# Patient Record
Sex: Female | Born: 1955 | Race: White | Hispanic: No | Marital: Married | Smoking: Former smoker
Health system: Southern US, Community
[De-identification: ages and names within clinical notes are randomized; demographics above are authoritative.]

## PROBLEM LIST (undated history)

## (undated) DIAGNOSIS — R51 Headache: Secondary | ICD-10-CM

## (undated) DIAGNOSIS — M797 Fibromyalgia: Secondary | ICD-10-CM

## (undated) DIAGNOSIS — R0600 Dyspnea, unspecified: Secondary | ICD-10-CM

## (undated) DIAGNOSIS — E039 Hypothyroidism, unspecified: Secondary | ICD-10-CM

## (undated) DIAGNOSIS — D472 Monoclonal gammopathy: Secondary | ICD-10-CM

## (undated) DIAGNOSIS — G4730 Sleep apnea, unspecified: Secondary | ICD-10-CM

## (undated) DIAGNOSIS — K3184 Gastroparesis: Secondary | ICD-10-CM

## (undated) DIAGNOSIS — M51369 Other intervertebral disc degeneration, lumbar region without mention of lumbar back pain or lower extremity pain: Secondary | ICD-10-CM

## (undated) DIAGNOSIS — M719 Bursopathy, unspecified: Secondary | ICD-10-CM

## (undated) DIAGNOSIS — E240 Pituitary-dependent Cushing's disease: Secondary | ICD-10-CM

## (undated) DIAGNOSIS — I10 Essential (primary) hypertension: Secondary | ICD-10-CM

## (undated) DIAGNOSIS — M1990 Unspecified osteoarthritis, unspecified site: Secondary | ICD-10-CM

## (undated) DIAGNOSIS — G629 Polyneuropathy, unspecified: Secondary | ICD-10-CM

## (undated) DIAGNOSIS — M5136 Other intervertebral disc degeneration, lumbar region: Secondary | ICD-10-CM

## (undated) DIAGNOSIS — F419 Anxiety disorder, unspecified: Secondary | ICD-10-CM

## (undated) DIAGNOSIS — M351 Other overlap syndromes: Secondary | ICD-10-CM

## (undated) DIAGNOSIS — J189 Pneumonia, unspecified organism: Secondary | ICD-10-CM

## (undated) DIAGNOSIS — M3500 Sicca syndrome, unspecified: Secondary | ICD-10-CM

## (undated) DIAGNOSIS — M519 Unspecified thoracic, thoracolumbar and lumbosacral intervertebral disc disorder: Secondary | ICD-10-CM

## (undated) DIAGNOSIS — K219 Gastro-esophageal reflux disease without esophagitis: Secondary | ICD-10-CM

## (undated) HISTORY — PX: TRANSPHENOIDAL / TRANSNASAL HYPOPHYSECTOMY / RESECTION PITUITARY TUMOR: SUR1382

## (undated) HISTORY — DX: Other intervertebral disc degeneration, lumbar region: M51.36

## (undated) HISTORY — DX: Other intervertebral disc degeneration, lumbar region without mention of lumbar back pain or lower extremity pain: M51.369

## (undated) HISTORY — PX: TONSILLECTOMY: SUR1361

## (undated) HISTORY — DX: Gastroparesis: K31.84

## (undated) HISTORY — PX: BRAIN SURGERY: SHX531

## (undated) HISTORY — PX: APPENDECTOMY: SHX54

## (undated) HISTORY — PX: TRIGGER FINGER RELEASE: SHX641

## (undated) HISTORY — PX: TUBAL LIGATION: SHX77

## (undated) HISTORY — PX: COMBINED HYSTEROSCOPY DIAGNOSTIC / D&C: SUR297

## (undated) HISTORY — PX: BACK SURGERY: SHX140

## (undated) HISTORY — PX: ENDOMETRIAL ABLATION: SHX621

## (undated) HISTORY — PX: ESOPHAGOGASTRODUODENOSCOPY ENDOSCOPY: SHX5814

## (undated) HISTORY — DX: Bursopathy, unspecified: M71.9

## (undated) HISTORY — PX: CARPAL TUNNEL RELEASE: SHX101

## (undated) HISTORY — PX: COLONOSCOPY: SHX174

## (undated) HISTORY — PX: CATARACT EXTRACTION, BILATERAL: SHX1313

## (undated) HISTORY — PX: FRACTURE SURGERY: SHX138

---

## 1998-10-23 ENCOUNTER — Inpatient Hospital Stay (HOSPITAL_COMMUNITY): Admission: EM | Admit: 1998-10-23 | Discharge: 1998-10-24 | Payer: Self-pay | Admitting: Emergency Medicine

## 1998-10-23 ENCOUNTER — Encounter: Payer: Self-pay | Admitting: Orthopedic Surgery

## 1998-10-23 ENCOUNTER — Encounter: Payer: Self-pay | Admitting: Emergency Medicine

## 1998-10-24 ENCOUNTER — Encounter: Payer: Self-pay | Admitting: Orthopedic Surgery

## 1999-01-24 ENCOUNTER — Ambulatory Visit (HOSPITAL_COMMUNITY): Admission: RE | Admit: 1999-01-24 | Discharge: 1999-01-24 | Payer: Self-pay | Admitting: Orthopedic Surgery

## 1999-01-24 ENCOUNTER — Encounter: Payer: Self-pay | Admitting: Orthopedic Surgery

## 1999-04-27 ENCOUNTER — Other Ambulatory Visit: Admission: RE | Admit: 1999-04-27 | Discharge: 1999-04-27 | Payer: Self-pay | Admitting: Family Medicine

## 2004-07-25 ENCOUNTER — Ambulatory Visit (HOSPITAL_COMMUNITY): Admission: RE | Admit: 2004-07-25 | Discharge: 2004-07-25 | Payer: Self-pay | Admitting: Obstetrics

## 2007-08-09 ENCOUNTER — Encounter: Admission: RE | Admit: 2007-08-09 | Discharge: 2007-08-09 | Payer: Self-pay | Admitting: Orthopedic Surgery

## 2008-02-27 ENCOUNTER — Encounter: Admission: RE | Admit: 2008-02-27 | Discharge: 2008-02-27 | Payer: Self-pay | Admitting: Internal Medicine

## 2009-03-22 ENCOUNTER — Ambulatory Visit (HOSPITAL_COMMUNITY): Admission: RE | Admit: 2009-03-22 | Discharge: 2009-03-22 | Payer: Self-pay | Admitting: Obstetrics

## 2009-04-20 ENCOUNTER — Ambulatory Visit (HOSPITAL_BASED_OUTPATIENT_CLINIC_OR_DEPARTMENT_OTHER): Admission: RE | Admit: 2009-04-20 | Discharge: 2009-04-20 | Payer: Self-pay | Admitting: Orthopedic Surgery

## 2009-12-07 ENCOUNTER — Encounter
Admission: RE | Admit: 2009-12-07 | Discharge: 2009-12-07 | Payer: Self-pay | Admitting: Physical Medicine and Rehabilitation

## 2010-03-07 ENCOUNTER — Encounter: Admission: RE | Admit: 2010-03-07 | Discharge: 2010-03-07 | Payer: Self-pay | Admitting: Neurosurgery

## 2010-03-14 ENCOUNTER — Encounter: Admission: RE | Admit: 2010-03-14 | Discharge: 2010-03-14 | Payer: Self-pay | Admitting: Neurosurgery

## 2010-03-30 ENCOUNTER — Inpatient Hospital Stay (HOSPITAL_COMMUNITY): Admission: RE | Admit: 2010-03-30 | Discharge: 2010-04-01 | Payer: Self-pay | Admitting: Neurosurgery

## 2010-05-10 ENCOUNTER — Encounter: Admission: RE | Admit: 2010-05-10 | Discharge: 2010-05-10 | Payer: Self-pay | Admitting: Neurosurgery

## 2010-06-21 ENCOUNTER — Encounter: Admission: RE | Admit: 2010-06-21 | Discharge: 2010-06-21 | Payer: Self-pay | Admitting: Neurosurgery

## 2010-07-19 ENCOUNTER — Ambulatory Visit (HOSPITAL_COMMUNITY)
Admission: RE | Admit: 2010-07-19 | Discharge: 2010-07-19 | Payer: Self-pay | Source: Home / Self Care | Attending: Unknown Physician Specialty | Admitting: Unknown Physician Specialty

## 2010-09-02 ENCOUNTER — Encounter: Payer: Self-pay | Admitting: Obstetrics

## 2010-10-27 LAB — GLUCOSE, CAPILLARY
Glucose-Capillary: 107 mg/dL — ABNORMAL HIGH (ref 70–99)
Glucose-Capillary: 168 mg/dL — ABNORMAL HIGH (ref 70–99)
Glucose-Capillary: 239 mg/dL — ABNORMAL HIGH (ref 70–99)
Glucose-Capillary: 55 mg/dL — ABNORMAL LOW (ref 70–99)
Glucose-Capillary: 70 mg/dL (ref 70–99)
Glucose-Capillary: 73 mg/dL (ref 70–99)
Glucose-Capillary: 91 mg/dL (ref 70–99)
Glucose-Capillary: 95 mg/dL (ref 70–99)

## 2010-10-27 LAB — CBC
HCT: 43.2 % (ref 36.0–46.0)
Hemoglobin: 14.1 g/dL (ref 12.0–15.0)
MCH: 28.9 pg (ref 26.0–34.0)
MCHC: 32.6 g/dL (ref 30.0–36.0)
MCV: 88.5 fL (ref 78.0–100.0)
Platelets: 330 10*3/uL (ref 150–400)
RBC: 4.88 MIL/uL (ref 3.87–5.11)
RDW: 15.6 % — ABNORMAL HIGH (ref 11.5–15.5)
WBC: 16.6 10*3/uL — ABNORMAL HIGH (ref 4.0–10.5)

## 2010-10-27 LAB — BASIC METABOLIC PANEL
BUN: 16 mg/dL (ref 6–23)
CO2: 27 mEq/L (ref 19–32)
Calcium: 9.8 mg/dL (ref 8.4–10.5)
Chloride: 102 mEq/L (ref 96–112)
Creatinine, Ser: 0.62 mg/dL (ref 0.4–1.2)
GFR calc Af Amer: >60 mL/min (ref >60)
GFR calc non Af Amer: >60 mL/min (ref >60)
Glucose, Bld: 144 mg/dL — ABNORMAL HIGH (ref 70–99)
Potassium: 4.2 mEq/L (ref 3.5–5.1)
Sodium: 138 mEq/L (ref 135–145)

## 2010-10-27 LAB — TYPE AND SCREEN
ABO/RH(D): A POS
Antibody Screen: NEGATIVE

## 2010-10-27 LAB — SURGICAL PCR SCREEN
MRSA, PCR: NEGATIVE
Staphylococcus aureus: POSITIVE — AB

## 2010-10-27 LAB — ABO/RH: ABO/RH(D): A POS

## 2010-11-17 LAB — BASIC METABOLIC PANEL
CO2: 27 mEq/L (ref 19–32)
Calcium: 9.5 mg/dL (ref 8.4–10.5)
Creatinine, Ser: 0.6 mg/dL (ref 0.4–1.2)
Glucose, Bld: 219 mg/dL — ABNORMAL HIGH (ref 70–99)

## 2011-05-23 DIAGNOSIS — D352 Benign neoplasm of pituitary gland: Secondary | ICD-10-CM | POA: Insufficient documentation

## 2011-05-23 DIAGNOSIS — E249 Cushing's syndrome, unspecified: Secondary | ICD-10-CM | POA: Insufficient documentation

## 2011-10-31 ENCOUNTER — Other Ambulatory Visit: Payer: Self-pay | Admitting: Neurosurgery

## 2011-10-31 DIAGNOSIS — M549 Dorsalgia, unspecified: Secondary | ICD-10-CM

## 2011-11-04 ENCOUNTER — Ambulatory Visit
Admission: RE | Admit: 2011-11-04 | Discharge: 2011-11-04 | Disposition: A | Discharge status: Home / Self Care | Source: Ambulatory Visit | Attending: Neurosurgery | Admitting: Neurosurgery

## 2011-11-04 DIAGNOSIS — M549 Dorsalgia, unspecified: Secondary | ICD-10-CM

## 2011-11-04 MED ORDER — GADOBENATE DIMEGLUMINE 529 MG/ML IV SOLN
15.0000 mL | Freq: Once | INTRAVENOUS | Status: AC | PRN
  Administered 2011-11-04: 15 mL via INTRAVENOUS

## 2011-11-07 ENCOUNTER — Other Ambulatory Visit: Payer: Self-pay | Admitting: Neurosurgery

## 2011-11-07 ENCOUNTER — Encounter (HOSPITAL_COMMUNITY): Payer: Self-pay | Admitting: Pharmacy Technician

## 2011-11-08 ENCOUNTER — Encounter (HOSPITAL_COMMUNITY): Payer: Self-pay

## 2011-11-08 ENCOUNTER — Other Ambulatory Visit: Payer: Self-pay

## 2011-11-08 ENCOUNTER — Encounter (HOSPITAL_COMMUNITY)
Admission: RE | Admit: 2011-11-08 | Discharge: 2011-11-08 | Disposition: A | Discharge status: Home / Self Care | Source: Ambulatory Visit | Attending: Neurosurgery | Admitting: Neurosurgery

## 2011-11-08 HISTORY — DX: Polyneuropathy, unspecified: G62.9

## 2011-11-08 HISTORY — DX: Fibromyalgia: M79.7

## 2011-11-08 HISTORY — DX: Hypothyroidism, unspecified: E03.9

## 2011-11-08 HISTORY — DX: Unspecified osteoarthritis, unspecified site: M19.90

## 2011-11-08 HISTORY — DX: Headache: R51

## 2011-11-08 HISTORY — DX: Pituitary-dependent Cushing's disease: E24.0

## 2011-11-08 HISTORY — DX: Sleep apnea, unspecified: G47.30

## 2011-11-08 HISTORY — DX: Sjogren syndrome, unspecified: M35.00

## 2011-11-08 HISTORY — DX: Anxiety disorder, unspecified: F41.9

## 2011-11-08 HISTORY — DX: Unspecified thoracic, thoracolumbar and lumbosacral intervertebral disc disorder: M51.9

## 2011-11-08 HISTORY — DX: Gastro-esophageal reflux disease without esophagitis: K21.9

## 2011-11-08 LAB — COMPREHENSIVE METABOLIC PANEL
ALT: 25 U/L (ref 0–35)
Alkaline Phosphatase: 87 U/L (ref 39–117)
BUN: 13 mg/dL (ref 6–23)
CO2: 27 mEq/L (ref 19–32)
Chloride: 98 mEq/L (ref 96–112)
GFR calc Af Amer: >90 mL/min (ref >90)
Glucose, Bld: 162 mg/dL — ABNORMAL HIGH (ref 70–99)
Potassium: 3.8 mEq/L (ref 3.5–5.1)
Sodium: 137 mEq/L (ref 135–145)
Total Bilirubin: 0.2 mg/dL — ABNORMAL LOW (ref 0.3–1.2)
Total Protein: 8 g/dL (ref 6.0–8.3)

## 2011-11-08 LAB — CBC
HCT: 40 % (ref 36.0–46.0)
MCHC: 33 g/dL (ref 30.0–36.0)
Platelets: 308 10*3/uL (ref 150–400)
RDW: 15.9 % — ABNORMAL HIGH (ref 11.5–15.5)

## 2011-11-08 NOTE — Pre-Procedure Instructions (Addendum)
20 Gloria Porter  11/08/2011   Your procedure is scheduled on:  April 2  Report to [facility] at 0530 AM.  Call this number if you have problems the morning of surgery: [phone]   Remember:   Do not eat food:After Midnight.  May have clear liquids: up to 4 Hours before arrival.  Clear liquids include soda, tea, black coffee, apple or grape juice, broth.  Take these medicines the morning of surgery with A SIP OF WATER: HYDROCOTISONE,RANITIDINE   Do not wear jewelry, make-up or nail polish.  Do not wear lotions, powders, or perfumes. You may wear deodorant.  Do not shave 48 hours prior to surgery.  Do not bring valuables to the hospital.  Contacts, dentures or bridgework may not be worn into surgery.  Leave suitcase in the car. After surgery it may be brought to your room.  For patients admitted to the hospital, checkout time is 11:00 AM the day of discharge.   Patients discharged the day of surgery will not be allowed to drive home.  Name and phone number of your driver: SPOUSE  Special Instructions: CHG Shower Use Special Wash: 1/2 bottle night before surgery and 1/2 bottle morning of surgery.   Please read over the following fact sheets that you were given: Pain Booklet, MRSA Information and Surgical Site Infection Prevention

## 2011-11-09 NOTE — Anesthesia Preprocedure Evaluation (Addendum)
Anesthesia Evaluation  Patient identified by MRN, date of birth, ID band Patient awake  General Assessment Comment:Glidescope required in 03/2010  Reviewed: Allergy & Precautions, H&P , NPO status , Patient's Chart, lab work & pertinent test results, reviewed documented beta blocker date and time   History of Anesthesia Complications (+) DIFFICULT AIRWAY  Airway Mallampati: III  Neck ROM: limited   Comment: H/o difficult intubation noted by previous anesthesiologist.  Glidescope was recommended. Dental  (+) Teeth Intact and Dental Advisory Given   Pulmonary sleep apnea , former smoker         Cardiovascular     Neuro/Psych  Headaches, PSYCHIATRIC DISORDERS Anxiety  Neuromuscular disease    GI/Hepatic GERD-  ,  Endo/Other  Diabetes mellitus-, Well Controlled, Type 1, Insulin DependentHypothyroidism DM with insulin pump  Renal/GU      Musculoskeletal  (+) Fibromyalgia -  Abdominal (+) + obese,   Peds  Hematology   Anesthesia Other Findings   Reproductive/Obstetrics                        Anesthesia Physical Anesthesia Plan  ASA: III  Anesthesia Plan: General   Post-op Pain Management:    Induction: Intravenous  Airway Management Planned: Oral ETT and Video Laryngoscope Planned  Additional Equipment:   Intra-op Plan:   Post-operative Plan: Extubation in OR  Informed Consent: I have reviewed the patients History and Physical, chart, labs and discussed the procedure including the risks, benefits and alternatives for the proposed anesthesia with the patient or authorized representative who has indicated his/her understanding and acceptance.   Dental advisory given  Plan Discussed with: CRNA and Surgeon  Anesthesia Plan Comments: (See Anesthesia consult from 11/09/11 and Anesthesia records from 03/30/10 with recommendation for induction with short-acting agent and alternative techniques  readily available (glidescope).  Shonna Chock, PA-C )      Anesthesia Quick Evaluation

## 2011-11-09 NOTE — Consult Note (Signed)
Anesthesia:  Patient is a 56 year old female scheduled for bilateral L4-5 decompression on 11/13/11.  She has a difficult intubation history.  Other medical history includes former smoker, OSA, DM with insulin pump, fibromyalgia, Sjogren's disease, Cushing's Syndrome, hypothyroidism, headaches, anxiety, GERD, arthritis, neuropathy.    She is s/p right L4-5 ALIF on 03/30/10.  Her Anesthesiologist noted her to be a difficult intubation due to obesity and decreased neck mobility.  A glidescope and #3 MAC were utilized (see Anesthesia records).  His future recommendations were: Induction with short-acting agent and alternative techniques readily available (glidescope).  Labs acceptable.  CXR from 11/08/11 showed chronic bibasilar atelectasis versus scarring. Mild chronic bronchitic changes.  EKG from 11/08/10 showed NSR, LAD, moderate voltage criteria for LVH, cannot rule out anterior infarct (age undetermined).  It was not felt significantly changed from her prior EKG from September 2010.  She is not followed regularly by a Cardiologist, but she did see Dr. Jacinto Halim (of [facility] at that time) in 2009 and had a stress test on 02/06/08 that showed normal myocardial perfusion with an EF 74%.  Plan to proceed.

## 2011-11-12 MED ORDER — CEFAZOLIN SODIUM-DEXTROSE 2-3 GM-% IV SOLR
2.0000 g | INTRAVENOUS | Status: AC
  Administered 2011-11-13: 2 g via INTRAVENOUS
  Filled 2011-11-12: qty 50

## 2011-11-13 ENCOUNTER — Encounter (HOSPITAL_COMMUNITY): Payer: Self-pay | Admitting: *Deleted

## 2011-11-13 ENCOUNTER — Encounter (HOSPITAL_COMMUNITY): Payer: Self-pay | Admitting: Vascular Surgery

## 2011-11-13 ENCOUNTER — Ambulatory Visit (HOSPITAL_COMMUNITY)
Admission: RE | Admit: 2011-11-13 | Discharge: 2011-11-14 | Disposition: A | Discharge status: Home / Self Care | Source: Ambulatory Visit | Attending: Neurosurgery | Admitting: Neurosurgery

## 2011-11-13 ENCOUNTER — Ambulatory Visit (HOSPITAL_COMMUNITY): Admitting: Vascular Surgery

## 2011-11-13 ENCOUNTER — Encounter (HOSPITAL_COMMUNITY)
Admission: RE | Disposition: A | Discharge status: Home / Self Care | Payer: Self-pay | Source: Ambulatory Visit | Attending: Neurosurgery

## 2011-11-13 ENCOUNTER — Ambulatory Visit (HOSPITAL_COMMUNITY)

## 2011-11-13 DIAGNOSIS — Z794 Long term (current) use of insulin: Secondary | ICD-10-CM | POA: Insufficient documentation

## 2011-11-13 DIAGNOSIS — E039 Hypothyroidism, unspecified: Secondary | ICD-10-CM | POA: Insufficient documentation

## 2011-11-13 DIAGNOSIS — Z01812 Encounter for preprocedural laboratory examination: Secondary | ICD-10-CM | POA: Insufficient documentation

## 2011-11-13 DIAGNOSIS — Z01810 Encounter for preprocedural cardiovascular examination: Secondary | ICD-10-CM | POA: Insufficient documentation

## 2011-11-13 DIAGNOSIS — M48062 Spinal stenosis, lumbar region with neurogenic claudication: Secondary | ICD-10-CM

## 2011-11-13 DIAGNOSIS — M48061 Spinal stenosis, lumbar region without neurogenic claudication: Secondary | ICD-10-CM | POA: Insufficient documentation

## 2011-11-13 DIAGNOSIS — IMO0001 Reserved for inherently not codable concepts without codable children: Secondary | ICD-10-CM | POA: Insufficient documentation

## 2011-11-13 DIAGNOSIS — Z01818 Encounter for other preprocedural examination: Secondary | ICD-10-CM | POA: Insufficient documentation

## 2011-11-13 DIAGNOSIS — M3500 Sicca syndrome, unspecified: Secondary | ICD-10-CM | POA: Insufficient documentation

## 2011-11-13 DIAGNOSIS — E109 Type 1 diabetes mellitus without complications: Secondary | ICD-10-CM | POA: Insufficient documentation

## 2011-11-13 HISTORY — PX: LUMBAR LAMINECTOMY/DECOMPRESSION MICRODISCECTOMY: SHX5026

## 2011-11-13 LAB — GLUCOSE, CAPILLARY: Glucose-Capillary: 233 mg/dL — ABNORMAL HIGH (ref 70–99)

## 2011-11-13 SURGERY — LUMBAR LAMINECTOMY/DECOMPRESSION MICRODISCECTOMY
Anesthesia: General | Site: Spine Lumbar | Laterality: Bilateral | Wound class: Clean

## 2011-11-13 MED ORDER — INSULIN ASPART 100 UNIT/ML ~~LOC~~ SOLN
4.0000 [IU] | Freq: Once | SUBCUTANEOUS | Status: AC
  Administered 2011-11-13: 4 [IU] via SUBCUTANEOUS

## 2011-11-13 MED ORDER — DEXAMETHASONE SODIUM PHOSPHATE 4 MG/ML IJ SOLN: 4.0000 mg | Freq: Four times a day (QID) | INTRAMUSCULAR | Status: AC

## 2011-11-13 MED ORDER — FUROSEMIDE 40 MG PO TABS
40.0000 mg | ORAL_TABLET | Freq: Every day | ORAL | Status: DC
  Administered 2011-11-13 – 2011-11-14 (×2): 40 mg via ORAL
  Filled 2011-11-13 (×2): qty 1

## 2011-11-13 MED ORDER — INSULIN PUMP
Freq: Three times a day (TID) | SUBCUTANEOUS | Status: DC
  Administered 2011-11-13: 40.425 via SUBCUTANEOUS
  Administered 2011-11-14: 4.95 via SUBCUTANEOUS
  Administered 2011-11-14: 3.75 via SUBCUTANEOUS
  Filled 2011-11-13: qty 1

## 2011-11-13 MED ORDER — HYDROMORPHONE HCL PF 1 MG/ML IJ SOLN
INTRAMUSCULAR | Status: AC
  Filled 2011-11-13: qty 1

## 2011-11-13 MED ORDER — HYDROXYCHLOROQUINE SULFATE 200 MG PO TABS
200.0000 mg | ORAL_TABLET | Freq: Two times a day (BID) | ORAL | Status: DC
  Administered 2011-11-13 – 2011-11-14 (×3): 200 mg via ORAL
  Filled 2011-11-13 (×4): qty 1

## 2011-11-13 MED ORDER — HYDROCORTISONE 5 MG PO TABS
15.0000 mg | ORAL_TABLET | Freq: Every day | ORAL | Status: DC
  Administered 2011-11-14: 15 mg via ORAL
  Filled 2011-11-13 (×2): qty 1

## 2011-11-13 MED ORDER — PREGABALIN 50 MG PO CAPS
50.0000 mg | ORAL_CAPSULE | Freq: Every day | ORAL | Status: DC
  Administered 2011-11-13 – 2011-11-14 (×2): 50 mg via ORAL
  Filled 2011-11-13 (×2): qty 1

## 2011-11-13 MED ORDER — MENTHOL 3 MG MT LOZG
1.0000 | LOZENGE | OROMUCOSAL | Status: DC | PRN
  Filled 2011-11-13: qty 9

## 2011-11-13 MED ORDER — LIDOCAINE HCL (CARDIAC) 20 MG/ML IV SOLN
INTRAVENOUS | Status: DC | PRN
  Administered 2011-11-13: 70 mg via INTRAVENOUS

## 2011-11-13 MED ORDER — HYDROCODONE-ACETAMINOPHEN 5-325 MG PO TABS: 1.0000 | ORAL_TABLET | ORAL | Status: DC | PRN

## 2011-11-13 MED ORDER — DEXAMETHASONE SODIUM PHOSPHATE 4 MG/ML IJ SOLN
INTRAMUSCULAR | Status: DC | PRN
  Administered 2011-11-13: 4 mg via INTRAVENOUS

## 2011-11-13 MED ORDER — INSULIN ASPART 100 UNIT/ML ~~LOC~~ SOLN
0.0000 [IU] | Freq: Three times a day (TID) | SUBCUTANEOUS | Status: DC
  Administered 2011-11-13: 11 [IU] via SUBCUTANEOUS
  Administered 2011-11-14: 2 [IU] via SUBCUTANEOUS

## 2011-11-13 MED ORDER — ACETAMINOPHEN 325 MG PO TABS: 650.0000 mg | ORAL_TABLET | ORAL | Status: DC | PRN

## 2011-11-13 MED ORDER — LACTATED RINGERS IV SOLN
INTRAVENOUS | Status: DC | PRN
  Administered 2011-11-13 (×2): via INTRAVENOUS

## 2011-11-13 MED ORDER — MIDAZOLAM HCL 5 MG/5ML IJ SOLN
INTRAMUSCULAR | Status: DC | PRN
  Administered 2011-11-13: 2 mg via INTRAVENOUS

## 2011-11-13 MED ORDER — ONDANSETRON HCL 4 MG/2ML IJ SOLN: 4.0000 mg | Freq: Four times a day (QID) | INTRAMUSCULAR | Status: DC | PRN

## 2011-11-13 MED ORDER — INSULIN ASPART 100 UNIT/ML ~~LOC~~ SOLN: 0.0000 [IU] | Freq: Every day | SUBCUTANEOUS | Status: DC

## 2011-11-13 MED ORDER — FAMOTIDINE 10 MG PO TABS
10.0000 mg | ORAL_TABLET | Freq: Every day | ORAL | Status: DC
  Administered 2011-11-13 – 2011-11-14 (×2): 10 mg via ORAL
  Filled 2011-11-13 (×3): qty 1

## 2011-11-13 MED ORDER — FENTANYL CITRATE 0.05 MG/ML IJ SOLN
INTRAMUSCULAR | Status: DC | PRN
  Administered 2011-11-13: 100 ug via INTRAVENOUS

## 2011-11-13 MED ORDER — PREGABALIN 50 MG PO CAPS: 50.0000 mg | ORAL_CAPSULE | Freq: Three times a day (TID) | ORAL | Status: DC

## 2011-11-13 MED ORDER — HYDROCORTISONE 5 MG PO TABS
5.0000 mg | ORAL_TABLET | Freq: Every day | ORAL | Status: DC
  Administered 2011-11-13: 5 mg via ORAL
  Filled 2011-11-13 (×2): qty 1

## 2011-11-13 MED ORDER — INSULIN ASPART 100 UNIT/ML ~~LOC~~ SOLN
0.0000 [IU] | Freq: Once | SUBCUTANEOUS | Status: AC
  Administered 2011-11-13: 5 [IU] via SUBCUTANEOUS

## 2011-11-13 MED ORDER — ACETAMINOPHEN 650 MG RE SUPP: 650.0000 mg | RECTAL | Status: DC | PRN

## 2011-11-13 MED ORDER — HYDROCORTISONE 5 MG PO TABS: 5.0000 mg | ORAL_TABLET | Freq: Two times a day (BID) | ORAL | Status: DC

## 2011-11-13 MED ORDER — PROPOFOL 10 MG/ML IV EMUL
INTRAVENOUS | Status: DC | PRN
  Administered 2011-11-13: 180 mg via INTRAVENOUS

## 2011-11-13 MED ORDER — MONTELUKAST SODIUM 10 MG PO TABS
10.0000 mg | ORAL_TABLET | Freq: Every day | ORAL | Status: DC
  Administered 2011-11-13: 10 mg via ORAL
  Filled 2011-11-13 (×2): qty 1

## 2011-11-13 MED ORDER — DIAZEPAM 5 MG PO TABS: 5.0000 mg | ORAL_TABLET | Freq: Four times a day (QID) | ORAL | Status: DC | PRN

## 2011-11-13 MED ORDER — 0.9 % SODIUM CHLORIDE (POUR BTL) OPTIME
TOPICAL | Status: DC | PRN
  Administered 2011-11-13: 1000 mL

## 2011-11-13 MED ORDER — HYDROMORPHONE HCL PF 1 MG/ML IJ SOLN
0.2500 mg | INTRAMUSCULAR | Status: DC | PRN
  Administered 2011-11-13 (×3): 0.25 mg via INTRAVENOUS

## 2011-11-13 MED ORDER — ZOLPIDEM TARTRATE 5 MG PO TABS
10.0000 mg | ORAL_TABLET | Freq: Every evening | ORAL | Status: DC | PRN
  Administered 2011-11-13: 10 mg via ORAL
  Filled 2011-11-13: qty 2

## 2011-11-13 MED ORDER — POTASSIUM CHLORIDE IN NACL 20-0.45 MEQ/L-% IV SOLN
INTRAVENOUS | Status: DC
  Administered 2011-11-13: 14:00:00 via INTRAVENOUS
  Filled 2011-11-13 (×4): qty 1000

## 2011-11-13 MED ORDER — BUPROPION HCL ER (SR) 150 MG PO TB12
150.0000 mg | ORAL_TABLET | Freq: Every day | ORAL | Status: DC
  Administered 2011-11-13 – 2011-11-14 (×2): 150 mg via ORAL
  Filled 2011-11-13 (×2): qty 1

## 2011-11-13 MED ORDER — SODIUM CHLORIDE 0.9 % IV SOLN
INTRAVENOUS | Status: AC
  Filled 2011-11-13: qty 500

## 2011-11-13 MED ORDER — ONDANSETRON HCL 4 MG/2ML IJ SOLN
INTRAMUSCULAR | Status: DC | PRN
  Administered 2011-11-13: 4 mg via INTRAVENOUS

## 2011-11-13 MED ORDER — GLYCOPYRROLATE 0.2 MG/ML IJ SOLN
INTRAMUSCULAR | Status: DC | PRN
  Administered 2011-11-13: 1 mg via INTRAVENOUS

## 2011-11-13 MED ORDER — ONDANSETRON HCL 4 MG/2ML IJ SOLN
4.0000 mg | INTRAMUSCULAR | Status: DC | PRN
  Administered 2011-11-13: 4 mg via INTRAVENOUS

## 2011-11-13 MED ORDER — THROMBIN 5000 UNITS EX KIT
PACK | CUTANEOUS | Status: DC | PRN
  Administered 2011-11-13 (×2): 5000 [IU] via TOPICAL

## 2011-11-13 MED ORDER — PHENOL 1.4 % MT LIQD: 1.0000 | OROMUCOSAL | Status: DC | PRN

## 2011-11-13 MED ORDER — SODIUM CHLORIDE 0.9 % IJ SOLN
3.0000 mL | Freq: Two times a day (BID) | INTRAMUSCULAR | Status: DC
  Administered 2011-11-13 – 2011-11-14 (×3): 3 mL via INTRAVENOUS

## 2011-11-13 MED ORDER — SODIUM CHLORIDE 0.9 % IV SOLN: 250.0000 mL | INTRAVENOUS | Status: DC

## 2011-11-13 MED ORDER — BACITRACIN 50000 UNITS IM SOLR
INTRAMUSCULAR | Status: AC
  Filled 2011-11-13: qty 1

## 2011-11-13 MED ORDER — DEXAMETHASONE 4 MG PO TABS
4.0000 mg | ORAL_TABLET | Freq: Four times a day (QID) | ORAL | Status: AC
  Administered 2011-11-13 (×2): 4 mg via ORAL
  Filled 2011-11-13 (×2): qty 1

## 2011-11-13 MED ORDER — LEVOTHYROXINE SODIUM 200 MCG PO TABS
200.0000 ug | ORAL_TABLET | Freq: Every day | ORAL | Status: DC
  Administered 2011-11-14: 200 ug via ORAL
  Filled 2011-11-13 (×3): qty 1

## 2011-11-13 MED ORDER — HYDROMORPHONE HCL PF 1 MG/ML IJ SOLN
1.0000 mg | INTRAMUSCULAR | Status: DC | PRN
  Administered 2011-11-13 – 2011-11-14 (×4): 1.5 mg via INTRAMUSCULAR
  Filled 2011-11-13 (×4): qty 2

## 2011-11-13 MED ORDER — INSULIN ASPART 100 UNIT/ML ~~LOC~~ SOLN
4.0000 [IU] | Freq: Three times a day (TID) | SUBCUTANEOUS | Status: DC
  Administered 2011-11-13 – 2011-11-14 (×2): 4 [IU] via SUBCUTANEOUS

## 2011-11-13 MED ORDER — ONDANSETRON HCL 4 MG/2ML IJ SOLN
INTRAMUSCULAR | Status: AC
  Filled 2011-11-13: qty 2

## 2011-11-13 MED ORDER — SODIUM CHLORIDE 0.9 % IV SOLN
1500.0000 mg | Freq: Two times a day (BID) | INTRAVENOUS | Status: DC
  Administered 2011-11-13 – 2011-11-14 (×2): 1500 mg via INTRAVENOUS
  Filled 2011-11-13 (×3): qty 1500

## 2011-11-13 MED ORDER — HEMOSTATIC AGENTS (NO CHARGE) OPTIME
TOPICAL | Status: DC | PRN
  Administered 2011-11-13: 1 via TOPICAL

## 2011-11-13 MED ORDER — SUCCINYLCHOLINE CHLORIDE 20 MG/ML IJ SOLN
INTRAMUSCULAR | Status: DC | PRN
  Administered 2011-11-13: 140 mg via INTRAVENOUS

## 2011-11-13 MED ORDER — PREGABALIN 50 MG PO CAPS
100.0000 mg | ORAL_CAPSULE | Freq: Every day | ORAL | Status: DC
  Administered 2011-11-13: 100 mg via ORAL
  Filled 2011-11-13: qty 2

## 2011-11-13 MED ORDER — NEOSTIGMINE METHYLSULFATE 1 MG/ML IJ SOLN
INTRAMUSCULAR | Status: DC | PRN
  Administered 2011-11-13: 5 mg via INTRAVENOUS

## 2011-11-13 MED ORDER — SODIUM CHLORIDE 0.9 % IJ SOLN: 3.0000 mL | INTRAMUSCULAR | Status: DC | PRN

## 2011-11-13 MED ORDER — MUPIROCIN 2 % EX OINT
TOPICAL_OINTMENT | Freq: Once | CUTANEOUS | Status: AC
  Administered 2011-11-13: 23:00:00 via NASAL
  Filled 2011-11-13: qty 22

## 2011-11-13 MED ORDER — ROCURONIUM BROMIDE 100 MG/10ML IV SOLN
INTRAVENOUS | Status: DC | PRN
  Administered 2011-11-13: 30 mg via INTRAVENOUS
  Administered 2011-11-13 (×2): 10 mg via INTRAVENOUS

## 2011-11-13 MED ORDER — SODIUM CHLORIDE 0.9 % IR SOLN
Status: DC | PRN
  Administered 2011-11-13: 09:00:00

## 2011-11-13 SURGICAL SUPPLY — 55 items
ADH SKN CLS LQ APL DERMABOND (GAUZE/BANDAGES/DRESSINGS) ×1
APL SKNCLS STERI-STRIP NONHPOA (GAUZE/BANDAGES/DRESSINGS) ×2
BAG DECANTER FOR FLEXI CONT (MISCELLANEOUS) ×2 IMPLANT
BENZOIN TINCTURE PRP APPL 2/3 (GAUZE/BANDAGES/DRESSINGS) ×3 IMPLANT
BLADE SURG ROTATE 9660 (MISCELLANEOUS) ×1 IMPLANT
BRUSH SCRUB EZ PLAIN DRY (MISCELLANEOUS) ×2 IMPLANT
CANISTER SUCTION 2500CC (MISCELLANEOUS) ×2 IMPLANT
CLOTH BEACON ORANGE TIMEOUT ST (SAFETY) ×2 IMPLANT
CONT SPEC 4OZ CLIKSEAL STRL BL (MISCELLANEOUS) ×2 IMPLANT
DERMABOND ADHESIVE PROPEN (GAUZE/BANDAGES/DRESSINGS) ×1
DERMABOND ADVANCED .7 DNX6 (GAUZE/BANDAGES/DRESSINGS) IMPLANT
DRAPE LAPAROTOMY 100X72X124 (DRAPES) ×2 IMPLANT
DRAPE MICROSCOPE LEICA (MISCELLANEOUS) ×1 IMPLANT
DRAPE MICROSCOPE ZEISS OPMI (DRAPES) ×1 IMPLANT
DRAPE POUCH INSTRU U-SHP 10X18 (DRAPES) ×2 IMPLANT
DRAPE SURG 17X23 STRL (DRAPES) ×4 IMPLANT
DRESSING TELFA 8X3 (GAUZE/BANDAGES/DRESSINGS) ×2 IMPLANT
ELECT BLADE 4.0 EZ CLEAN MEGAD (MISCELLANEOUS) ×2
ELECT REM PT RETURN 9FT ADLT (ELECTROSURGICAL) ×2
ELECTRODE BLDE 4.0 EZ CLN MEGD (MISCELLANEOUS) IMPLANT
ELECTRODE REM PT RTRN 9FT ADLT (ELECTROSURGICAL) ×1 IMPLANT
EVACUATOR 1/8 PVC DRAIN (DRAIN) ×1 IMPLANT
GAUZE SPONGE 4X4 16PLY XRAY LF (GAUZE/BANDAGES/DRESSINGS) ×3 IMPLANT
GLOVE BIOGEL PI IND STRL 7.0 (GLOVE) IMPLANT
GLOVE BIOGEL PI IND STRL 8.5 (GLOVE) IMPLANT
GLOVE BIOGEL PI INDICATOR 7.0 (GLOVE) ×2
GLOVE BIOGEL PI INDICATOR 8.5 (GLOVE) ×1
GLOVE ECLIPSE 7.5 STRL STRAW (GLOVE) ×2 IMPLANT
GLOVE ECLIPSE 8.5 STRL (GLOVE) ×1 IMPLANT
GLOVE SURG SS PI 6.5 STRL IVOR (GLOVE) ×2 IMPLANT
GLOVE SURG SS PI 7.0 STRL IVOR (GLOVE) ×1 IMPLANT
GOWN BRE IMP SLV AUR LG STRL (GOWN DISPOSABLE) ×3 IMPLANT
GOWN BRE IMP SLV AUR XL STRL (GOWN DISPOSABLE) ×1 IMPLANT
GOWN STRL REIN 2XL LVL4 (GOWN DISPOSABLE) ×1 IMPLANT
KIT BASIN OR (CUSTOM PROCEDURE TRAY) ×2 IMPLANT
KIT ROOM TURNOVER OR (KITS) ×2 IMPLANT
NDL SPNL 22GX3.5 QUINCKE BK (NEEDLE) ×1 IMPLANT
NEEDLE HYPO 22GX1.5 SAFETY (NEEDLE) ×2 IMPLANT
NEEDLE SPNL 22GX3.5 QUINCKE BK (NEEDLE) ×2 IMPLANT
NS IRRIG 1000ML POUR BTL (IV SOLUTION) ×2 IMPLANT
PACK LAMINECTOMY NEURO (CUSTOM PROCEDURE TRAY) ×2 IMPLANT
PAD ARMBOARD 7.5X6 YLW CONV (MISCELLANEOUS) ×8 IMPLANT
PATTIES SURGICAL .75X.75 (GAUZE/BANDAGES/DRESSINGS) ×1 IMPLANT
RUBBERBAND STERILE (MISCELLANEOUS) ×4 IMPLANT
SPONGE GAUZE 4X4 12PLY (GAUZE/BANDAGES/DRESSINGS) ×2 IMPLANT
SPONGE SURGIFOAM ABS GEL SZ50 (HEMOSTASIS) ×2 IMPLANT
STAPLER SKIN PROX WIDE 3.9 (STAPLE) ×1 IMPLANT
STRIP CLOSURE SKIN 1/2X4 (GAUZE/BANDAGES/DRESSINGS) ×2 IMPLANT
SUT VIC AB 2-0 OS6 18 (SUTURE) ×6 IMPLANT
SUT VIC AB 3-0 CP2 18 (SUTURE) ×2 IMPLANT
SYR 20ML ECCENTRIC (SYRINGE) ×2 IMPLANT
TOOL FLUTED BALL 7MM (MISCELLANEOUS) ×2 IMPLANT
TOWEL OR 17X24 6PK STRL BLUE (TOWEL DISPOSABLE) ×2 IMPLANT
TOWEL OR 17X26 10 PK STRL BLUE (TOWEL DISPOSABLE) ×2 IMPLANT
WATER STERILE IRR 1000ML POUR (IV SOLUTION) ×2 IMPLANT

## 2011-11-13 NOTE — Progress Notes (Signed)
ANTIBIOTIC CONSULT NOTE - INITIAL  Pharmacy Consult for Vancomycin Indication: Post-op surgical prophylaxis  Allergies  Allergen Reactions  . Codeine Nausea And Vomiting    Patient Measurements:  Weight: 99.4kg  Vital Signs: Temp: 97.5 F (36.4 C) (04/02 1200) Temp src: Oral (04/02 1200) BP: 108/66 mmHg (04/02 1200) Pulse Rate: 73  (04/02 1200) Intake/Output from previous day:   Intake/Output from this shift: Total I/O In: 1740 [P.O.:240; I.V.:1500] Out: 200 [Blood:200]  Labs: No results found for this basename: WBC:3,HGB:3,PLT:3,LABCREA:3,CREATININE:3 in the last 72 hours CrCl is unknown because there is no height on file for the current visit. No results found for this basename: VANCOTROUGH:2,VANCOPEAK:2,VANCORANDOM:2,GENTTROUGH:2,GENTPEAK:2,GENTRANDOM:2,TOBRATROUGH:2,TOBRAPEAK:2,TOBRARND:2,AMIKACINPEAK:2,AMIKACINTROU:2,AMIKACIN:2, in the last 72 hours   Microbiology: Recent Results (from the past 720 hour(s))  SURGICAL PCR SCREEN     Status: Abnormal   Collection Time   11/08/11  3:12 PM      Component Value Range Status Comment   MRSA, PCR NEGATIVE  NEGATIVE  Final    Staphylococcus aureus POSITIVE (*) NEGATIVE  Final     Medical History: Past Medical History  Diagnosis Date  . Lumbar disc disease   . Difficult intubation     2011 scratched trachea  . Fibromyalgia   . Neuropathy   . Sjogren's disease   . Sleep apnea     no cpap.sleep study 2006 [facility]  . Cushing's disease     pituitary tumor removed 2012  . Diabetes mellitus     insulin pump.followed dr Nelva Nay smith.[facility]  . Hypothyroidism   . GERD (gastroesophageal reflux disease)   . Headache   . Arthritis   . Anxiety     Medications:  Prescriptions prior to admission  Medication Sig Dispense Refill  . buPROPion (WELLBUTRIN SR) 150 MG 12 hr tablet Take 150 mg by mouth daily.      . Calcium Carbonate-Vitamin D (CALCIUM + D PO) Take 1 tablet by mouth daily.      . furosemide  (LASIX) 40 MG tablet Take 40 mg by mouth daily.      Marland Kitchen HYDROcodone-acetaminophen (NORCO) 5-325 MG per tablet Take 1 tablet by mouth every 6 (six) hours as needed. For pain      . hydrocortisone (CORTEF) 10 MG tablet Take 5-15 mg by mouth 2 (two) times daily. Takes 15 mg in the AM and 5 mg in the Pm      . hydroxychloroquine (PLAQUENIL) 200 MG tablet Take 200 mg by mouth 2 (two) times daily.      . Insulin Human (INSULIN PUMP) 100 unit/ml SOLN Inject into the skin.      Marland Kitchen insulin regular (NOVOLIN R,HUMULIN R) 100 units/mL injection Inject into the skin 3 (three) times daily before meals. Has insulin pump      . levothyroxine (SYNTHROID, LEVOTHROID) 200 MCG tablet Take 200 mcg by mouth daily.      . montelukast (SINGULAIR) 10 MG tablet Take 10 mg by mouth at bedtime.      . pregabalin (LYRICA) 50 MG capsule Take 50-100 mg by mouth 3 (three) times daily. 50 mg in the AM, 100 MG at bedtime      . ranitidine (ZANTAC) 150 MG tablet Take 150 mg by mouth 2 (two) times daily.      Marland Kitchen zolpidem (AMBIEN CR) 12.5 MG CR tablet Take 12.5 mg by mouth at bedtime.       Assessment: 55yof s/p spinal surgery to start Vancomycin for post-op surgical prophylaxis. Hemovac/drain was placed during procedure - per MD  order, Vancomycin will be continued until discontinued by a physician. Patient received Ancef 2g pre-op (~ 0800). - Wt 99.4kg - CrCl >100 ml/min  Goal of Therapy:  Vancomycin trough level 10-15 mcg/ml  Plan:  1. Vancomycin 1.5g IV q12h 2. Monitor renal function, UOP and adjust as indicated  Cleon Dew [phone] 11/13/2011,1:14 PM

## 2011-11-13 NOTE — Anesthesia Procedure Notes (Signed)
Procedure Name: Intubation Date/Time: 11/13/2011 7:41 AM Performed by: Mancel Parsons Pre-anesthesia Checklist: Patient identified, Timeout performed, Emergency Drugs available, Suction available and Patient being monitored Patient Re-evaluated:Patient Re-evaluated prior to inductionOxygen Delivery Method: Circle system utilized Preoxygenation: Pre-oxygenation with 100% oxygen Intubation Type: IV induction and Cricoid Pressure applied Grade View: Grade III Tube type: Oral Tube size: 7.5 mm Number of attempts: 1 Airway Equipment and Method: Video-laryngoscopy and Rigid stylet Placement Confirmation: ETT inserted through vocal cords under direct vision,  breath sounds checked- equal and bilateral and positive ETCO2 Secured at: 23 cm Tube secured with: Tape Dental Injury: Teeth and Oropharynx as per pre-operative assessment  Difficulty Due To: Difficulty was anticipated, Difficult Airway- due to reduced neck mobility, Difficult Airway- due to limited oral opening and Difficult Airway- due to anterior larynx

## 2011-11-13 NOTE — Preoperative (Signed)
Beta Blockers   Reason not to administer Beta Blockers:Not Applicable. No home beta blockers 

## 2011-11-13 NOTE — H&P (Signed)
  Gloria Porter is an 56 y.o. female.   Chief Complaint: Back and bilateral leg pain HPI: The patient is a 56 year old female who had an xlif in the summer of 2011. She did well at that time but has still had some persistent leg pains since surgery. She returned recently with increasing difficulty and was evaluated with an MRI scan which showed persistent stenosis at L4-5. After discussing the options it was elected to proceed with a decompressive lumbar laminectomy. I had a long discussion with her regarding the risks and benefits of surgical intervention. The risks discussed include but are not limited to bleeding infection weakness numbness paralysis spinal fluid leakage coma and death. We have discussed alternative methods of therapy along with the risks and benefits of nonintervention. She has had the opportunity to rest numerous questions and appears to understand. With this information in hand she has requested that we proceed with surgery.  Past Medical History  Diagnosis Date  . Lumbar disc disease   . Difficult intubation     2011 scratched trachea  . Fibromyalgia   . Neuropathy   . Sjogren's disease   . Sleep apnea     no cpap.sleep study 2006 [facility]  . Cushing's disease     pituitary tumor removed 2012  . Diabetes mellitus     insulin pump.followed dr Nelva Nay smith.[facility]  . Hypothyroidism   . GERD (gastroesophageal reflux disease)   . Headache   . Arthritis   . Anxiety     Past Surgical History  Procedure Date  . Transphenoidal / transnasal hypophysectomy / resection pituitary tumor   . Tonsillectomy   . Appendectomy   . Tubal ligation   . Back surgery     lumbar fusion  . Endometrial ablation   . Trigger finger release   . Cesarean section   . Brain surgery   . Fracture surgery     l arm    History reviewed. No pertinent family history. Social History:  reports that she has quit smoking. She does not have any smokeless tobacco history on file.  She reports that she drinks about .6 ounces of alcohol per week. She reports that she does not use illicit drugs.  Allergies:  Allergies  Allergen Reactions  . Codeine Nausea And Vomiting    Medications Prior to Admission  Medication Dose Route Frequency Provider Last Rate Last Dose  . ceFAZolin (ANCEF) IVPB 2 g/50 mL premix  2 g Intravenous 60 min Pre-Op Reinaldo Meeker, MD       No current outpatient prescriptions on file as of 11/13/2011.    Results for orders placed during the hospital encounter of 11/13/11 (from the past 48 hour(s))  GLUCOSE, CAPILLARY     Status: Abnormal   Collection Time   11/13/11  6:08 AM      Component Value Range Comment   Glucose-Capillary 117 (*) 70 - 99 (mg/dL)    No results found.  Review of systems not obtained due to patient factors.  Blood pressure 116/75, pulse 78, temperature 98.3 F (36.8 C), temperature source Oral, resp. rate 20, SpO2 95.00%.  The patient is awake alert and oriented with no facial asymmetry. Her gait is non-antalgic. Her reflexes are decreased but equal is normal strength and sensation Assessment/Plan Impression is that of persistent spinal stenosis and the plan is for a decompressive lumbar laminectomy.  Reinaldo Meeker, MD 11/13/2011, 7:28 AM

## 2011-11-13 NOTE — Op Note (Signed)
Preop diagnosis: Spinal stenosis L4-5 Postop diagnosis: Same Procedure: Bilateral decompressive lumbar laminectomy L4-5 Surgeon: Demarrius Guerrero Assistant: Elsner  After being placed in the prone position the patient's back was prepped and draped in the usual sterile fashion. Localizing x-ray was taken prior to incision to identify the appropriate level. Midline incision was made above the spinous processes of L4 and L5. Using the Bovie cutting current the incision was carried down to the spinous processes. Subperiosteal dissection was then carried out bilaterally on the spinous processes and lamina and self-retaining cut was placed for exposure. X-ray showed approach the appropriate level. Using the Leksell rongeur the spinous processes of L4 and L5 were removed. Starting the patient's right side laminotomy was performed removing the inferior one half of the L4 lamina the medial one third of the facet joint and the superior one third of the L5 lamina. Similar decompression was then carried out on the opposite side. Residual bone and hypertrophic ligamentum flavum removed in a piecemeal fashion until the thecal sac was well decompressed. L5 nerve roots were followed out their foramen. Disc was evaluated and found not to be herniated and none need of removal. At this time inspection was carried out once more for any evidence of residual compression and none could be identified. Large amounts of irrigation were carried out and any bleeding controlled with upper coagulation and Gelfoam. An epidural drain was left in the epidural space and brought out through a separate stab incision. The wounds and closed in multiple layers of Vicryl on the muscle fascia subcutaneous and subcuticular tissues and Dermabond and Steri-Strips were placed on the skin. A sterile dressing was then applied the patient was extubated to recovery in stable condition.

## 2011-11-13 NOTE — Brief Op Note (Signed)
11/13/2011  9:49 AM  PATIENT:  Gloria Porter  56 y.o. female  PRE-OPERATIVE DIAGNOSIS:  Lumbar stenosis  POST-OPERATIVE DIAGNOSIS:  Lumbar Stenosis  PROCEDURE:  Procedure(s) (LRB): LUMBAR LAMINECTOMY/DECOMPRESSION MICRODISCECTOMY (Bilateral)  SURGEON:  Surgeon(s) and Role:    * Reinaldo Meeker, MD - Primary    * Barnett Abu, MD - Assisting  PHYSICIAN ASSISTANT:   ASSISTANTS: Elsner   ANESTHESIA:   general  EBL:  Total I/O In: 1500 [I.V.:1500] Out: 200 [Blood:200]  BLOOD ADMINISTERED:none  DRAINS: (medium) Hemovact drain(s) in the epidural space with  Suction Open   LOCAL MEDICATIONS USED:  MARCAINE     SPECIMEN:  No Specimen  DISPOSITION OF SPECIMEN:  N/A  COUNTS:  YES  TOURNIQUET:  * No tourniquets in log *  DICTATION: .Dragon Dictation  PLAN OF CARE: Admit for overnight observation  PATIENT DISPOSITION:  PACU - hemodynamically stable.   Delay start of Pharmacological VTE agent (>24hrs) due to surgical blood loss or risk of bleeding: yes

## 2011-11-13 NOTE — Anesthesia Postprocedure Evaluation (Signed)
Anesthesia Post Note  Patient: Gloria Porter  Procedure(s) Performed: Procedure(s) (LRB): LUMBAR LAMINECTOMY/DECOMPRESSION MICRODISCECTOMY (Bilateral)  Anesthesia type: General  Patient location: PACU  Post pain: Pain level controlled and Adequate analgesia  Post assessment: Post-op Vital signs reviewed, Patient's Cardiovascular Status Stable, Respiratory Function Stable, Patent Airway and Pain level controlled  Last Vitals:  Filed Vitals:   11/13/11 1000  BP:   Pulse:   Temp:   Resp: 14    Post vital signs: Reviewed and stable  Level of consciousness: awake, alert  and oriented  Complications: No apparent anesthesia complications

## 2011-11-13 NOTE — Transfer of Care (Signed)
Immediate Anesthesia Transfer of Care Note  Patient: Gloria Porter  Procedure(s) Performed: Procedure(s) (LRB): LUMBAR LAMINECTOMY/DECOMPRESSION MICRODISCECTOMY (Bilateral)  Patient Location: PACU  Anesthesia Type: General  Level of Consciousness: sedated  Airway & Oxygen Therapy: Patient Spontanous Breathing and Patient connected to face mask  Post-op Assessment: Report given to PACU RN and Post -op Vital signs reviewed and stable  Post vital signs: Reviewed  Complications: No apparent anesthesia complications

## 2011-11-13 NOTE — Progress Notes (Signed)
Inpatient Diabetes Program Recommendations  AACE/ADA: New Consensus Statement on Inpatient Glycemic Control (2009)  Target Ranges:  Prepandial:   less than 140 mg/dL      Peak postprandial:   less than 180 mg/dL (1-2 hours)      Critically ill patients:  140 - 180 mg/dL   Reason for Visit:  Patient normally uses insulin pump at home to manage diabetes   Note: Patient states she has type 2 diabetes.  Patient inadvertently pulled insulin pump infusion set from under skin just prior to surgery.  Patient forgot to bring extra insulin pump sets with her.  Currently has orders for moderate correction scale and meal coverage that nursing staff can administer until patient learns whether or not she is being discharged later today.  Husband states he will go home and get pump if doctor says she needs to stay until tomorrow so patient can resume pump here in hospital.  If d/c'd home this afternoon, will resume pump as soon as she gets home.  Insulin pump settings are as follows:  12 AM = 0.925 units/hr; 5 AM = 0.95 units/hr; 11 AM = 1.05 units/hr; 9 PM = 0.95 units/hr for a total of 23.68 units per 24 hours.  Insulin to CHO ratio is from 12 AM to 5 PM is 1:5 GM; 5 PM to 12 AM is 1:4 GM.  Correction Factor is from 12 AM to 7 AM = 40 mg/dl; 7AM to 10 PM = 30 mg/dl; 10 PM to 12 AM = 40 mg/dl.  Target glucose is 120 +/- 10 mg/dl.  Thank you. [phone])

## 2011-11-14 LAB — GLUCOSE, CAPILLARY

## 2011-11-14 MED ORDER — HYDROMORPHONE HCL 4 MG PO TABS: 4.0000 mg | ORAL_TABLET | ORAL | Status: AC | PRN

## 2011-11-14 NOTE — Discharge Summary (Signed)
  Surgery one day, home the next. Pain markedly improved. Ambulated well. Wound fine. Drain removed. Specific instructions given.

## 2011-11-15 ENCOUNTER — Encounter (HOSPITAL_COMMUNITY): Payer: Self-pay | Admitting: Neurosurgery

## 2011-11-29 IMAGING — CT CT L SPINE W/ CM
4 of 11 series · 11 of 33 positions shown, 13 images · IV contrast (omnipaque)
Comparison: MRI lumbar spine 12/07/2009.

CLINICAL DATA: Low back pain extending into both lower
extremities.

MYELOGRAM INJECTION
TECHNIQUE: Informed consent was obtained from the patient prior to
the procedure, including potential complications of headache,
allergy, infection and pain.  A timeout procedure was performed.
With the patient prone, the lower back was prepped with Betadine.
1% Lidocaine was used for local anesthesia.  Lumbar puncture was
performed at the right paramidline L2-3 level using a 22 gauge
needle with return of clear CSF.  15 ml of Omnipaque 151was
injected into the subarachnoid space .
TECHNIQUE: Following injection of intrathecal Omnipaque contrast,
spine imaging in multiple projections was performed using
fluoroscopy.
Fluoroscopy Time: 51 seconds .
TECHNIQUE: CT imaging of the lumbar spine was performed after
intrathecal contrast administration.  Multiplanar CT image
reconstructions were also generated.

[Series 2: l spine bone · axial · 0.27mm/px · z∈[-430,-185]mm · 3 of 99 slices shown, 4 images]
[im 1/99  soft-tissue]
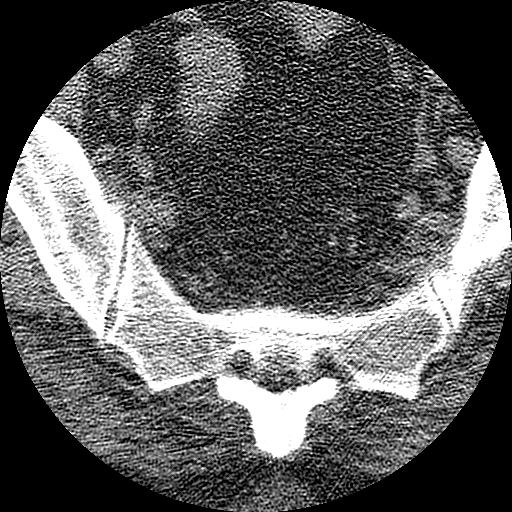
[im 1/99  bone]
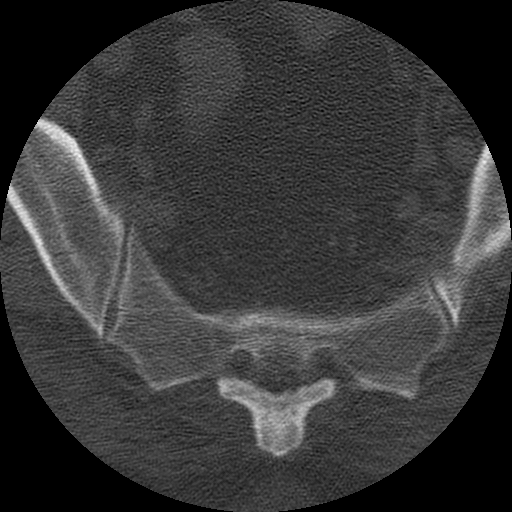
[im 50/99  bone]
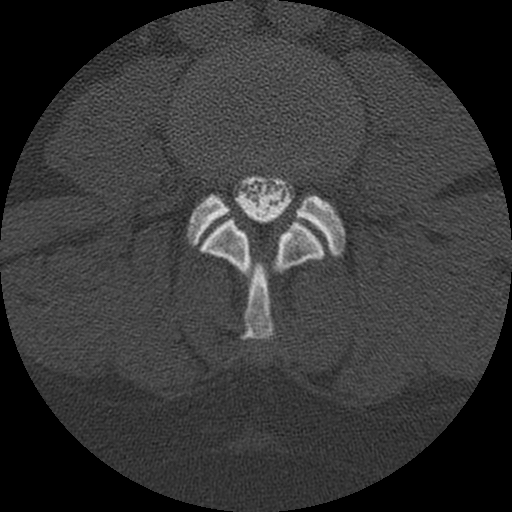
[im 99/99  bone]
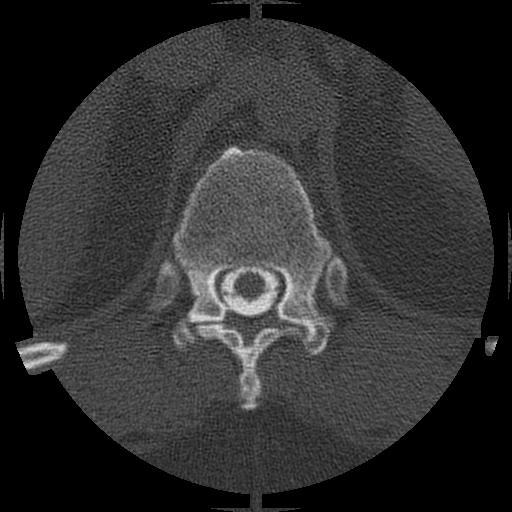

[Series 3: l spine soft · axial · 0.27mm/px · z∈[-350,-268]mm · 2 of 99 slices shown]
[im 33/99  soft-tissue]
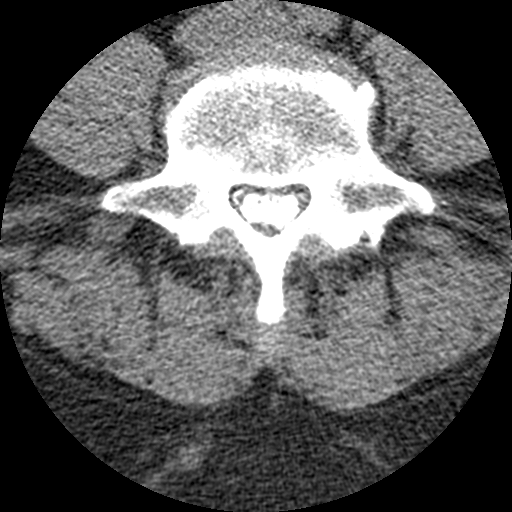
[im 66/99  soft-tissue]
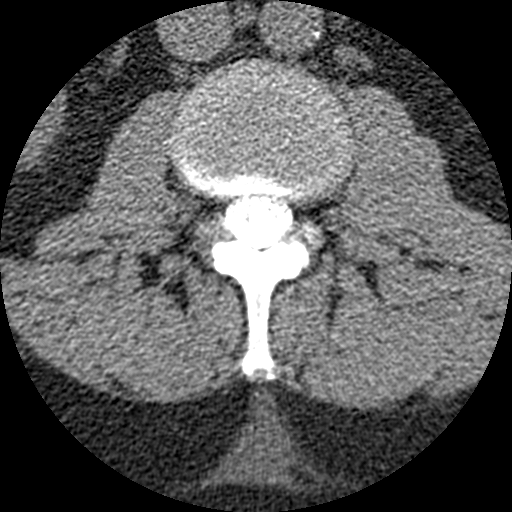

[Series 401: cor lower l-spine · coronal · 0.49mm/px · 1 of 44 slices shown]
[im 22/44  bone]
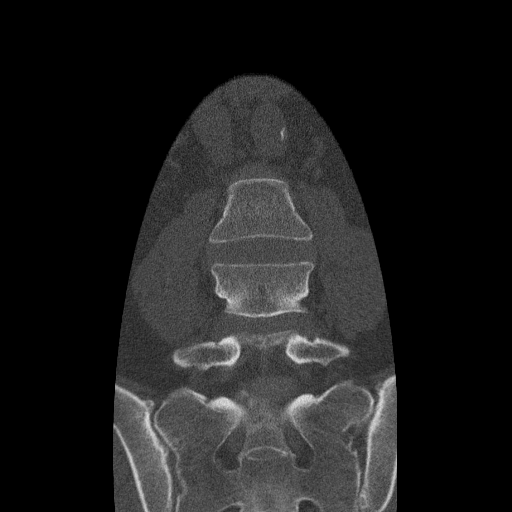

[Series 402: sag l-spine · sagittal · 0.49mm/px · 5 of 44 slices shown, 6 images]
[im 15/44  bone]
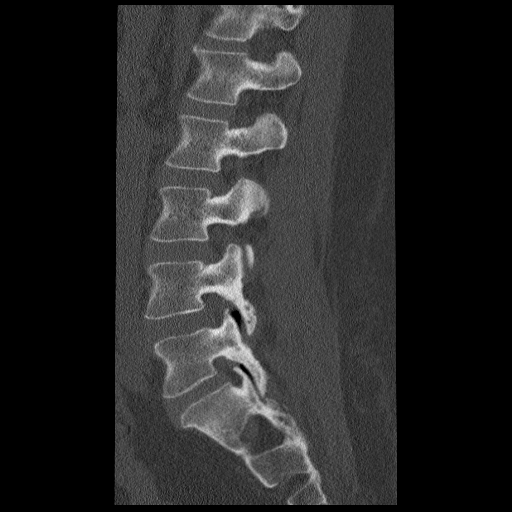
[im 18/44  bone]
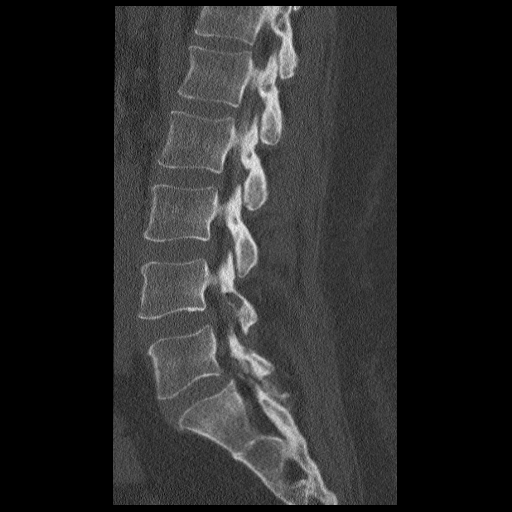
[im 22/44  soft-tissue]
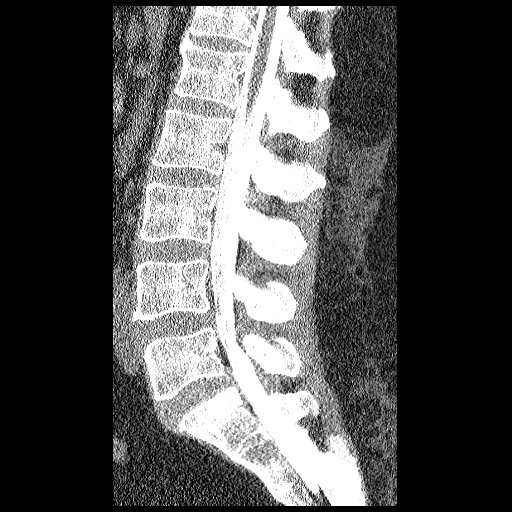
[im 22/44  bone]
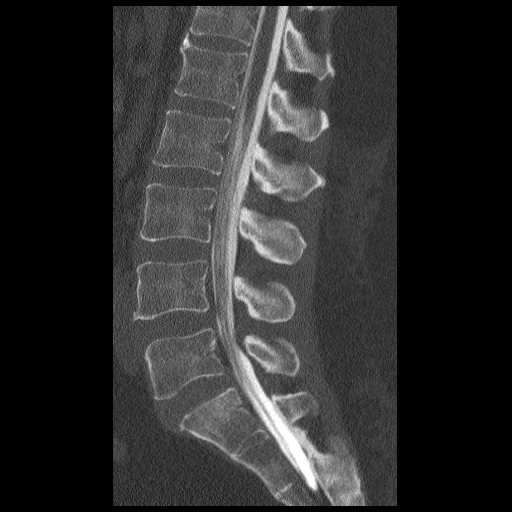
[im 26/44  bone]
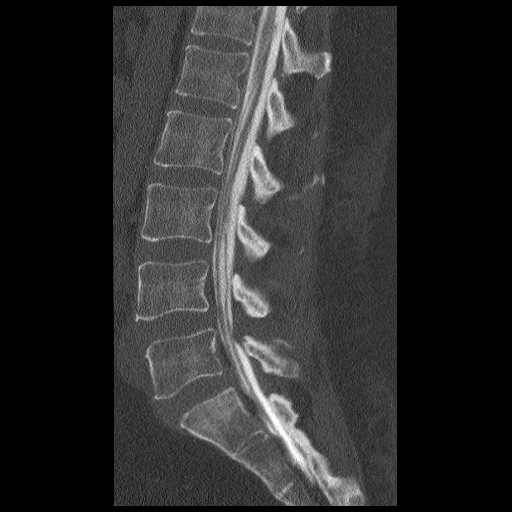
[im 29/44  bone]
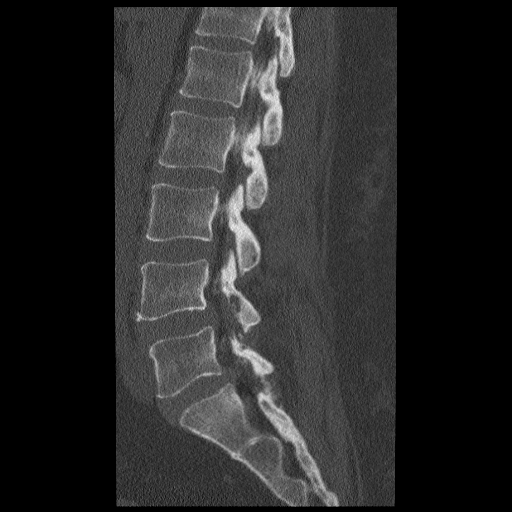

[11 of 33 positions shown; findings below may reference images not displayed]

IMPRESSION: Successful injection of  intrathecal contrast for myelography.

MYELOGRAM LUMBAR
FINDINGS: Slight anterolisthesis at L4-5 and L5 S1 is stable on the
prone images.  The nerve roots fill normally on both sides.  Slight
disc bulging is present at L4-5 and L5 S1.

Upon standing, the anterolisthesis at L4-5 is exaggerated.  This
becomes more pronounced with flexion and is partially reduced and
extension.  The slight anterolisthesis at L5-S1 is stable upon
standing and with flexion and extension.  Central canal narrowing
at L4-5 is exaggerated upon standing.
IMPRESSION: 1.  Dynamic anterolisthesis at L4-5 with associated central canal
stenosis.
2.  Slight anterolisthesis at L5-S1 is stable.

CT MYELOGRAPHY LUMBAR SPINE
FINDINGS: The lumbar spine is imaged from the midbody of T12
through the midbody of S3.  Slight anterolisthesis at L4-5 and L5
S1 is similar to that on the MRI.  Please see the comments in the
report above regarding dynamic anterolisthesis at L4-5.  Limited
imaging of the abdomen is unremarkable.  The individual disc levels
are as follows.

The disc levels at L2-3 and above are normal.

L3-4:  Rightward disc bulging creates mild right lateral recess
narrowing.  The central canal is patent.

L4-5:  A vacuum phenomena are noted at the facet joints
bilaterally.  Mild central canal stenosis is present.  There is
uncovering of the disc with bulging into both neural foramina.

L5-S1:  Advanced facet arthropathy is present bilaterally.  A
vacuum phenomenon is noted within the facet joints bilaterally.
Mild lateral recess narrowing is worse on the left.  Mild foraminal
narrowing is present bilaterally.
IMPRESSION: 1.  Similar appearance of mild central canal stenosis at L4-5 and
L5-S1.
2.  Similar appearance of bilateral foraminal stenosis at both L4-5
and L5-S1.
3.  Mild right foraminal stenosis at L3-L4.

## 2011-12-06 IMAGING — CR DG PELVIS 1-2V
1 series · 1 of 1 positions shown · non-contrast
Comparison: CT myelogram of the lumbar spine performed 03/07/2010

CLINICAL DATA: Posterior pelvic pain.

PELVIS - 1-2 VIEW

[t pelvis a.p.]
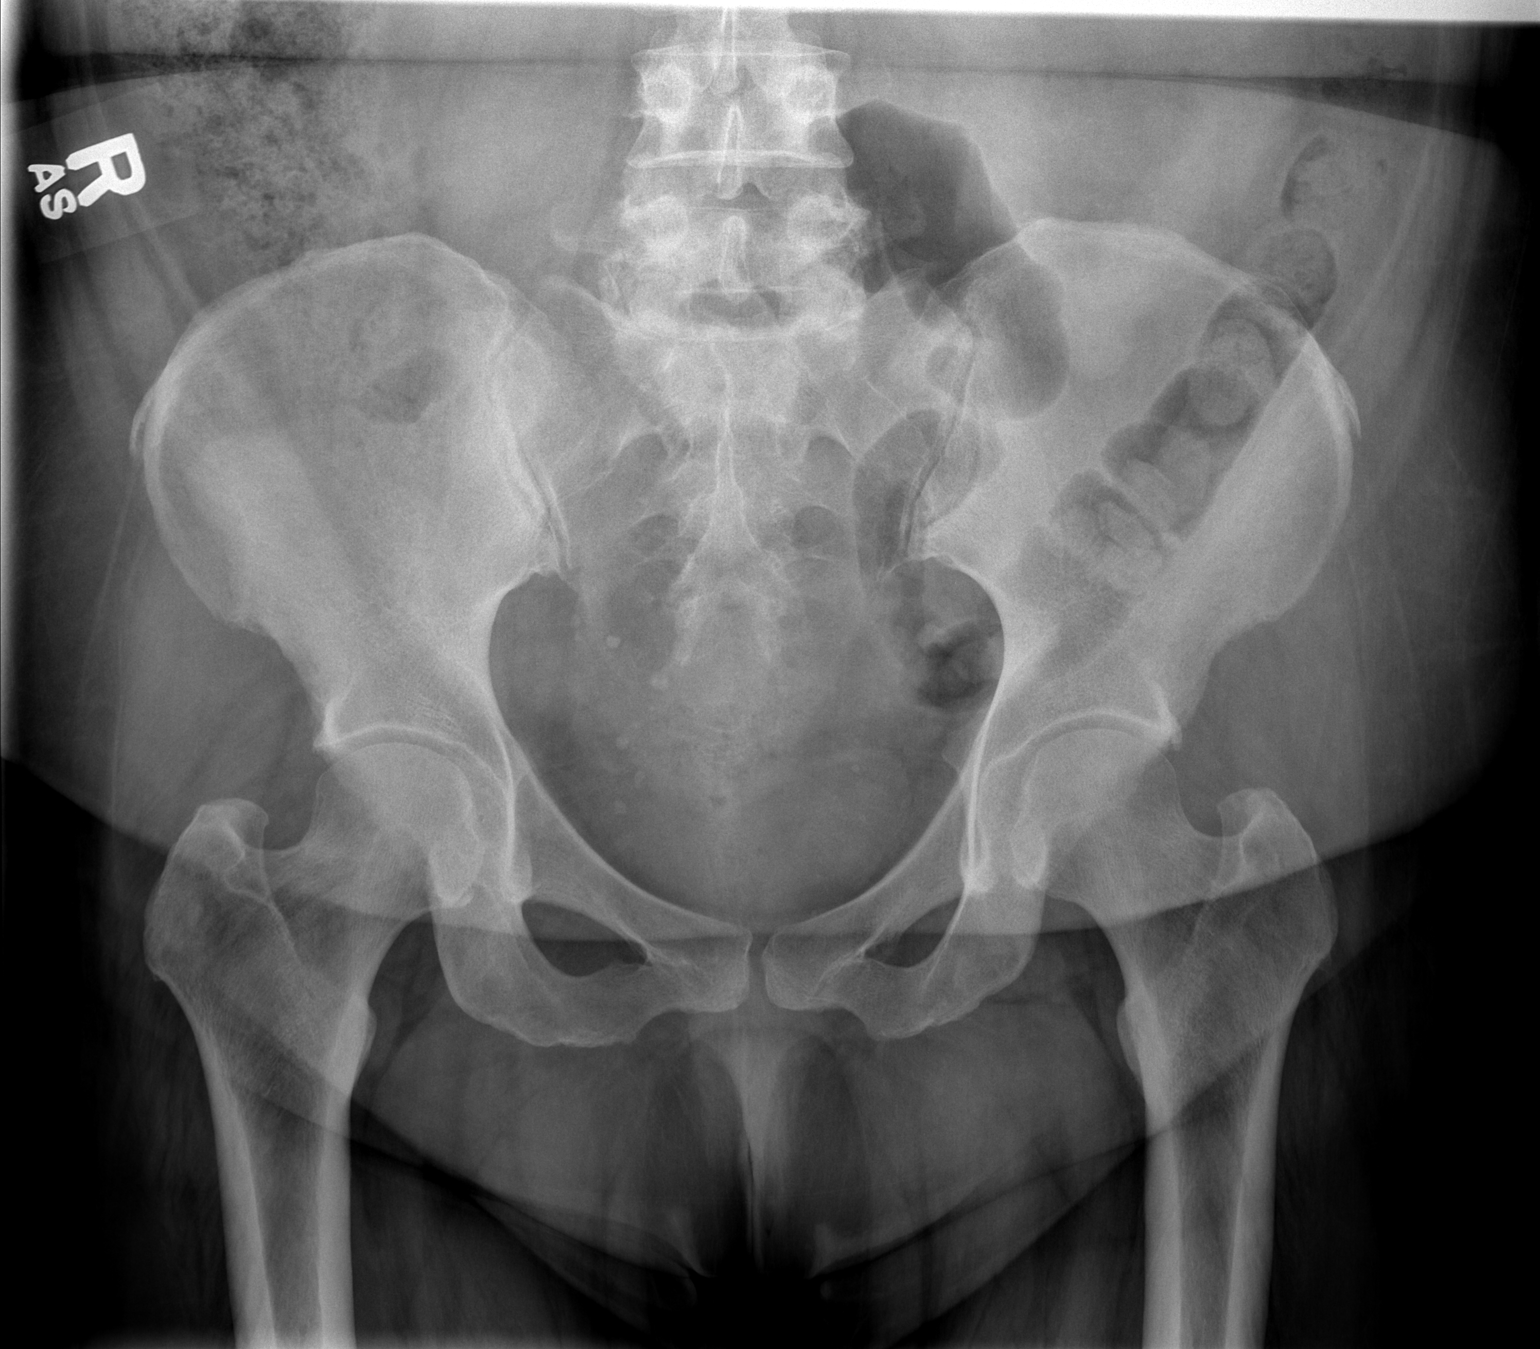

[1 of 1 positions shown; findings below may reference images not displayed]

FINDINGS: The visualized osseous anatomy is within normal limits.
There is no evidence of fracture or dislocation.  Both femoral
heads are seated normally within their respective acetabula.  No
significant degenerative change is appreciated.  The sacroiliac
joints are unremarkable in appearance.

The visualized bowel gas pattern is grossly unremarkable in
appearance.  Scattered phleboliths are noted within the pelvis.
IMPRESSION: Unremarkable osseous anatomy of the pelvis; no evidence of fracture
or dislocation.

## 2013-01-17 DIAGNOSIS — G4733 Obstructive sleep apnea (adult) (pediatric): Secondary | ICD-10-CM | POA: Insufficient documentation

## 2013-08-26 DIAGNOSIS — F329 Major depressive disorder, single episode, unspecified: Secondary | ICD-10-CM | POA: Insufficient documentation

## 2013-08-26 DIAGNOSIS — F418 Other specified anxiety disorders: Secondary | ICD-10-CM | POA: Insufficient documentation

## 2014-01-21 DIAGNOSIS — R0602 Shortness of breath: Secondary | ICD-10-CM | POA: Insufficient documentation

## 2014-01-21 DIAGNOSIS — K219 Gastro-esophageal reflux disease without esophagitis: Secondary | ICD-10-CM | POA: Insufficient documentation

## 2014-01-21 DIAGNOSIS — E669 Obesity, unspecified: Secondary | ICD-10-CM | POA: Insufficient documentation

## 2014-01-21 DIAGNOSIS — I10 Essential (primary) hypertension: Secondary | ICD-10-CM | POA: Insufficient documentation

## 2014-06-16 DIAGNOSIS — Z79890 Hormone replacement therapy (postmenopausal): Secondary | ICD-10-CM | POA: Insufficient documentation

## 2014-06-16 DIAGNOSIS — M797 Fibromyalgia: Secondary | ICD-10-CM | POA: Insufficient documentation

## 2014-06-16 DIAGNOSIS — E240 Pituitary-dependent Cushing's disease: Secondary | ICD-10-CM | POA: Insufficient documentation

## 2014-06-16 DIAGNOSIS — M4800 Spinal stenosis, site unspecified: Secondary | ICD-10-CM | POA: Insufficient documentation

## 2014-06-16 DIAGNOSIS — E668 Other obesity: Secondary | ICD-10-CM | POA: Insufficient documentation

## 2014-06-16 DIAGNOSIS — M3500 Sicca syndrome, unspecified: Secondary | ICD-10-CM | POA: Insufficient documentation

## 2014-06-16 DIAGNOSIS — E119 Type 2 diabetes mellitus without complications: Secondary | ICD-10-CM | POA: Insufficient documentation

## 2014-06-16 DIAGNOSIS — J302 Other seasonal allergic rhinitis: Secondary | ICD-10-CM | POA: Insufficient documentation

## 2014-06-16 DIAGNOSIS — G629 Polyneuropathy, unspecified: Secondary | ICD-10-CM | POA: Insufficient documentation

## 2014-06-16 DIAGNOSIS — G4700 Insomnia, unspecified: Secondary | ICD-10-CM | POA: Insufficient documentation

## 2014-06-16 DIAGNOSIS — N951 Menopausal and female climacteric states: Secondary | ICD-10-CM | POA: Insufficient documentation

## 2014-06-16 DIAGNOSIS — E039 Hypothyroidism, unspecified: Secondary | ICD-10-CM | POA: Insufficient documentation

## 2014-07-13 DIAGNOSIS — Z5181 Encounter for therapeutic drug level monitoring: Secondary | ICD-10-CM | POA: Insufficient documentation

## 2014-07-13 DIAGNOSIS — M961 Postlaminectomy syndrome, not elsewhere classified: Secondary | ICD-10-CM | POA: Insufficient documentation

## 2014-08-17 DIAGNOSIS — M2550 Pain in unspecified joint: Secondary | ICD-10-CM | POA: Insufficient documentation

## 2014-10-14 DIAGNOSIS — F411 Generalized anxiety disorder: Secondary | ICD-10-CM | POA: Insufficient documentation

## 2014-12-13 ENCOUNTER — Other Ambulatory Visit: Payer: Self-pay | Admitting: Neurosurgery

## 2014-12-13 DIAGNOSIS — M545 Low back pain: Secondary | ICD-10-CM

## 2014-12-17 ENCOUNTER — Other Ambulatory Visit

## 2014-12-17 ENCOUNTER — Ambulatory Visit
Admission: RE | Admit: 2014-12-17 | Discharge: 2014-12-17 | Disposition: A | Discharge status: Home / Self Care | Source: Ambulatory Visit | Attending: Neurosurgery | Admitting: Neurosurgery

## 2014-12-17 DIAGNOSIS — M545 Low back pain: Secondary | ICD-10-CM

## 2014-12-28 DIAGNOSIS — R1312 Dysphagia, oropharyngeal phase: Secondary | ICD-10-CM | POA: Insufficient documentation

## 2014-12-28 DIAGNOSIS — G5600 Carpal tunnel syndrome, unspecified upper limb: Secondary | ICD-10-CM | POA: Insufficient documentation

## 2015-01-11 ENCOUNTER — Ambulatory Visit (INDEPENDENT_AMBULATORY_CARE_PROVIDER_SITE_OTHER): Admitting: Neurology

## 2015-01-11 ENCOUNTER — Encounter: Payer: Self-pay | Admitting: Neurology

## 2015-01-11 VITALS — BP 136/60 | HR 78 | Resp 18 | Ht 63.0 in | Wt 254.4 lb

## 2015-01-11 DIAGNOSIS — E668 Other obesity: Secondary | ICD-10-CM

## 2015-01-11 DIAGNOSIS — M797 Fibromyalgia: Secondary | ICD-10-CM | POA: Diagnosis not present

## 2015-01-11 DIAGNOSIS — M961 Postlaminectomy syndrome, not elsewhere classified: Secondary | ICD-10-CM | POA: Diagnosis not present

## 2015-01-11 DIAGNOSIS — G629 Polyneuropathy, unspecified: Secondary | ICD-10-CM

## 2015-01-11 DIAGNOSIS — G2581 Restless legs syndrome: Secondary | ICD-10-CM

## 2015-01-11 DIAGNOSIS — E669 Obesity, unspecified: Secondary | ICD-10-CM

## 2015-01-11 DIAGNOSIS — E1142 Type 2 diabetes mellitus with diabetic polyneuropathy: Secondary | ICD-10-CM | POA: Diagnosis not present

## 2015-01-11 MED ORDER — PHENTERMINE HCL 37.5 MG PO CAPS: 37.5000 mg | ORAL_CAPSULE | Freq: Every day | ORAL | Status: DC

## 2015-01-11 MED ORDER — HORIZANT 600 MG PO TBCR: 600.0000 mg | EXTENDED_RELEASE_TABLET | Freq: Two times a day (BID) | ORAL | Status: DC

## 2015-01-11 NOTE — Progress Notes (Signed)
[facility]  PATIENT: Gloria Porter DOB: Apr 02, 1956  REFERRING DOCTOR OR PCP:  Idamae Schuller SOURCE: paitent  _________________________________   HISTORICAL  CHIEF COMPLAINT:  Chief Complaint  Patient presents with  . Peripheral Neuropathy    Former pt. of Dr. Garth Bigness from [facility], here for f/u of neuropathic pain in feet.  Sts. neuropathy is still worse at night.  Lidocaine gel helps.  Sts. she has new pain of ache that is relieved with rubbing, lower legs/ankles, and in webbing between right index and middle fingers.  Sts. she is having more pain/edema right hand/fim  . Fibromyalgia    She would like to discuss lumbarcool rf for chronic back pain.  She has a hx. of back sugery (Dr. Hal Neer).[place]    HISTORY OF PRESENT ILLNESS:  Gloria Porter is a 59 year old woman with neuropathy and fibromyalgia have previously seen at [facility].  Polyneuropathy/restless legs:   She reports burning dysesthesias in her toes bilaterally.   There is also a poking type of pain.   Pain is worse when laying down and is better if she moves around some.  Lidocaine ointment has helped a lot (does not need to use every day..usually just at night if she has more pain).   She is also on Horizant with benefit --- it helps more than gabapentin or Lyrica did and is better tolerated (dayitme sleepiness with gabapentin).    She has had DM since 1991 and is now on insulin.   Pain has not progressed much in past few years.      Fibromyalgia:  She notes widespread joint pain and myalgias that are usually mild but sometimes intensify..   Current joint pain is worse in the left hand.   She has seen Dr. Gerilyn Nestle in the past but had wanted her to go to Pain management.  She takes 2 hydrocodones most days.    She is currently on Horizant, Nortriptyline and Meloxicam for her pain (nortriptyline also for mood).      Weight Loss:   Last year, we started phentermine.   She notes  appetite is reduced but she has not lost much weight.   She tolerates it well.     LBP:   She reports axial back pain with an achy quality.  Most pain stays in back, though at times she has some left leg pain, as well.  She has had lumbar surgery by Dr. Kennon Holter (Neurosurgery) in the past.   More recently, she has seen Dr. Vilinda Flake at [facility].  He has recommended that she have Cooled RF of the medial branch nerves.     REVIEW OF SYSTEMS: Constitutional: No fevers, chills, sweats, or change in appetite.  Notes fatigue and sleepiness Eyes: No visual changes, double vision, eye pain Ear, nose and throat: No hearing loss, ear pain, nasal congestion, sore throat Cardiovascular: No chest pain, palpitations Respiratory: No shortness of breath at rest or with exertion.   No wheezes.  Has OSA GastrointestinaI: No nausea, vomiting, diarrhea, abdominal pain, fecal incontinence Genitourinary: Notes urgency and rare incontiennce Musculoskeletal: as above Integumentary: No rash, pruritus, skin lesions Neurological: as above Psychiatric: Notes depression and some anxiety Endocrine: No palpitations, diaphoresis, change in appetite, change in weigh or increased thirst Hematologic/Lymphatic: No anemia, purpura, petechiae. Allergic/Immunologic: No itchy/runny eyes, nasal congestion, recent allergic reactions, rashes  ALLERGIES: Allergies  Allergen Reactions  . Atorvastatin   . Codeine Nausea And Vomiting  . Quinapril Hcl  HOME MEDICATIONS:  Current outpatient prescriptions:  .  ACCU-CHEK FASTCLIX LANCETS MISC, , Disp: , Rfl:  .  HYDROcodone-acetaminophen (NORCO/VICODIN) 5-325 MG per tablet, Take 1/2 to 1 tablet, twice per day, if needed for pain, Disp: , Rfl:  .  lidocaine (XYLOCAINE) 2 % jelly, , Disp: , Rfl:  .  losartan (COZAAR) 50 MG tablet, Take 50 mg by mouth., Disp: , Rfl:  .  meloxicam (MOBIC) 15 MG tablet, TAKE 1 TABLET (15 MG TOTAL) BY MOUTH DAILY., Disp: ,  Rfl:  .  nortriptyline (PAMELOR) 50 MG capsule, Take 2 at night, Disp: , Rfl:  .  omeprazole (PRILOSEC) 40 MG capsule, Take 40 mg by mouth., Disp: , Rfl:  .  phentermine 37.5 MG capsule, , Disp: , Rfl:  .  buPROPion (WELLBUTRIN SR) 150 MG 12 hr tablet, Take 150 mg by mouth daily., Disp: , Rfl:  .  BYDUREON 2 MG PEN, , Disp: , Rfl: 1 .  calcium carbonate (OS-CAL - DOSED IN MG OF ELEMENTAL CALCIUM) 1250 (500 CA) MG tablet, Take by mouth., Disp: , Rfl:  .  Calcium Carbonate-Vitamin D (CALCIUM + D PO), Take 1 tablet by mouth daily., Disp: , Rfl:  .  canagliflozin (INVOKANA) 100 MG TABS tablet, Take 100 mg by mouth., Disp: , Rfl:  .  furosemide (LASIX) 40 MG tablet, Take 40 mg by mouth daily., Disp: , Rfl:  .  HORIZANT 600 MG TBCR, , Disp: , Rfl: 5 .  hydrocortisone (CORTEF) 10 MG tablet, Take 5-15 mg by mouth 2 (two) times daily. Takes 15 mg in the AM and 5 mg in the Pm, Disp: , Rfl:  .  hydroxychloroquine (PLAQUENIL) 200 MG tablet, Take 200 mg by mouth 2 (two) times daily., Disp: , Rfl:  .  Insulin Human (INSULIN PUMP) 100 unit/ml SOLN, Inject into the skin., Disp: , Rfl:  .  insulin regular (NOVOLIN R,HUMULIN R) 100 units/mL injection, Inject into the skin 3 (three) times daily before meals. Has insulin pump, Disp: , Rfl:  .  levothyroxine (SYNTHROID, LEVOTHROID) 200 MCG tablet, Take 200 mcg by mouth daily., Disp: , Rfl:  .  montelukast (SINGULAIR) 10 MG tablet, Take 10 mg by mouth at bedtime., Disp: , Rfl:  .  pregabalin (LYRICA) 50 MG capsule, Take 50-100 mg by mouth 3 (three) times daily. 50 mg in the AM, 100 MG at bedtime, Disp: , Rfl:  .  ranitidine (ZANTAC) 150 MG tablet, Take 150 mg by mouth 2 (two) times daily., Disp: , Rfl:  .  thyroid (ARMOUR) 240 MG tablet, Take by mouth., Disp: , Rfl:  .  zolpidem (AMBIEN CR) 12.5 MG CR tablet, Take 12.5 mg by mouth at bedtime., Disp: , Rfl:   PAST MEDICAL HISTORY: Past Medical History  Diagnosis Date  . Lumbar disc disease   . Difficult  intubation     2011 scratched trachea  . Fibromyalgia   . Neuropathy   . Sjogren's disease   . Sleep apnea     no cpap.sleep study 2006 [place]  . Cushing's disease     pituitary tumor removed 2012  . Diabetes mellitus     insulin pump.followed dr Peyton Bottoms [facility]  . Hypothyroidism   . GERD (gastroesophageal reflux disease)   . Headache(784.0)   . Arthritis   . Anxiety     PAST SURGICAL HISTORY: Past Surgical History  Procedure Laterality Date  . Transphenoidal / transnasal hypophysectomy / resection pituitary tumor    . Tonsillectomy    .  Appendectomy    . Tubal ligation    . Back surgery      lumbar fusion  . Endometrial ablation    . Trigger finger release    . Cesarean section    . Brain surgery    . Fracture surgery      l arm  . Lumbar laminectomy/decompression microdiscectomy  11/13/2011    Procedure: LUMBAR LAMINECTOMY/DECOMPRESSION MICRODISCECTOMY;  Surgeon: Faythe Ghee, MD;  Location: [facility];  Service: Neurosurgery;  Laterality: Bilateral;  Bilateral Lumbar four-five Decompression    FAMILY HISTORY: Family History  Problem Relation Age of Onset  . Diabetes type II Mother   . Diabetes type II Father   . Heart disease Father     SOCIAL HISTORY:  History   Social History  . Marital Status: Married    Spouse Name: N/A  . Number of Children: N/A  . Years of Education: N/A   Occupational History  . Not on file.   Social History Main Topics  . Smoking status: Former Smoker -- 1.00 packs/day for 30 years  . Smokeless tobacco: Not on file  . Alcohol Use: 0.6 oz/week    1 Glasses of wine per week  . Drug Use: No  . Sexual Activity: Not on file   Other Topics Concern  . Not on file   Social History Narrative     PHYSICAL EXAM  Filed Vitals:   01/11/15 1530  BP: 136/60  Pulse: 78  Resp: 18  Height: 5\' 3"  (1.6 m)  Weight: 254 lb 6.4 oz (115.395 kg)    Body mass index is 45.08 kg/(m^2).   General: The patient  is well-developed and well-nourished and in no acute distress  Neck: The neck is supple, no carotid bruits are noted.  The neck is mildly tender.  Cardiovascular: The heart has a regular rate and rhythm with a normal S1 and S2. There were no murmurs, gallops or rubs. Lungs are clear to auscultation.  Skin: Extremities are without significant edema.  Musculoskeletal:  She is tender over some of the classic fibromyalgia tender points in the upper chest and back.   Neurologic Exam  Mental status: The patient is alert and oriented x 3 at the time of the examination. The patient has apparent normal recent and remote memory, with an apparently normal attention span and concentration ability.   Speech is normal.  Cranial nerves: Extraocular movements are full.  Facial symmetry is present. There is good facial sensation to soft touch bilaterally.Facial strength is normal.  Trapezius and sternocleidomastoid strength is normal. No dysarthria is noted.  The tongue is midline, and the patient has symmetric elevation of the soft palate. No obvious hearing deficits are noted.  Motor:  Muscle bulk is normal.   Tone is normal. Strength is  5 / 5 in all 4 extremities.   Sensory: Sensory testing is intact to pinprick, soft touch and vibration sensation in the arms. She has mildly reduced and vibration sensation in the toes.  Coordination: Cerebellar testing reveals good finger-nose-finger and heel-to-shin bilaterally.  Gait and station: Station is normal.   Gait is normal. Tandem gait is normal. Romberg is negative.   Reflexes: Deep tendon reflexes are symmetric and 1+ bilaterally.       DIAGNOSTIC DATA (LABS, IMAGING, TESTING) - I reviewed patient records, labs, notes, testing and imaging myself where available.  Lab Results  Component Value Date   WBC 11.8* 11/08/2011   HGB 13.2 11/08/2011   HCT  40.0 11/08/2011   MCV 82.6 11/08/2011   PLT 308 11/08/2011      Component Value Date/Time   NA  137 11/08/2011 1522   K 3.8 11/08/2011 1522   CL 98 11/08/2011 1522   CO2 27 11/08/2011 1522   GLUCOSE 162* 11/08/2011 1522   BUN 13 11/08/2011 1522   CREATININE 0.59 11/08/2011 1522   CALCIUM 9.9 11/08/2011 1522   PROT 8.0 11/08/2011 1522   ALBUMIN 3.7 11/08/2011 1522   AST 33 11/08/2011 1522   ALT 25 11/08/2011 1522   ALKPHOS 87 11/08/2011 1522   BILITOT 0.2* 11/08/2011 1522   GFRNONAA >90 11/08/2011 1522   GFRAA >90 11/08/2011 1522      ASSESSMENT AND PLAN  Neuropathy  Type 2 diabetes mellitus with diabetic polyneuropathy  Fibrositis  Adiposity  Extreme obesity  Failed back syndrome of lumbar spine  Restless leg   1.   Refill phentermine for weight loss. 2.   Refill Horizant for polyneuropathy and restless leg pain. 3.   We discussed that if she has had facet mediated pain in the back that the cooled RF may be helpful to her. 4.   She is advised to stay active and exercises as tolerated. Hopefully, she will be able to exercise more after her back pain improves. She will return to see [place] in a month or sooner if she has new or worsening neurologic symptoms.   Richard A. Felecia Shelling, MD, PhD 2/56/3893, 7:34 PM Certified in Neurology, Clinical Neurophysiology, Sleep Medicine, Pain Medicine and Neuroimaging  [facility] [address] [place], [place] [address] [phone]

## 2015-03-03 ENCOUNTER — Other Ambulatory Visit: Payer: Self-pay | Admitting: Neurology

## 2015-07-13 ENCOUNTER — Ambulatory Visit (INDEPENDENT_AMBULATORY_CARE_PROVIDER_SITE_OTHER): Admitting: Neurology

## 2015-07-13 ENCOUNTER — Encounter: Payer: Self-pay | Admitting: Neurology

## 2015-07-13 VITALS — BP 144/62 | HR 78 | Resp 18 | Ht 63.0 in | Wt 242.4 lb

## 2015-07-13 DIAGNOSIS — G629 Polyneuropathy, unspecified: Secondary | ICD-10-CM | POA: Diagnosis not present

## 2015-07-13 DIAGNOSIS — G5603 Carpal tunnel syndrome, bilateral upper limbs: Secondary | ICD-10-CM | POA: Diagnosis not present

## 2015-07-13 DIAGNOSIS — G4733 Obstructive sleep apnea (adult) (pediatric): Secondary | ICD-10-CM | POA: Diagnosis not present

## 2015-07-13 DIAGNOSIS — M797 Fibromyalgia: Secondary | ICD-10-CM

## 2015-07-13 DIAGNOSIS — F411 Generalized anxiety disorder: Secondary | ICD-10-CM

## 2015-07-13 DIAGNOSIS — M4800 Spinal stenosis, site unspecified: Secondary | ICD-10-CM

## 2015-07-13 DIAGNOSIS — G2581 Restless legs syndrome: Secondary | ICD-10-CM

## 2015-07-13 DIAGNOSIS — F329 Major depressive disorder, single episode, unspecified: Secondary | ICD-10-CM

## 2015-07-13 DIAGNOSIS — F32A Depression, unspecified: Secondary | ICD-10-CM

## 2015-07-13 MED ORDER — LIDOCAINE HCL 2 % EX GEL: Freq: Every day | CUTANEOUS | Status: DC

## 2015-07-13 MED ORDER — PHENTERMINE HCL 37.5 MG PO CAPS: 37.5000 mg | ORAL_CAPSULE | Freq: Every day | ORAL | Status: DC

## 2015-07-13 NOTE — Progress Notes (Signed)
[facility]  PATIENT: Gloria Porter DOB: 12-09-1955  REFERRING DOCTOR OR PCP:  Idamae Schuller SOURCE: paitent  _________________________________   HISTORICAL  CHIEF COMPLAINT:  Chief Complaint  Patient presents with  . Peripheral Neuropathy    Sts. pain is feet is slightly worse since last ov.  Sts. has to use lidocaine gel just about every night.  Wants to see if she can get a larger quantity of lidocaine gel. Sts. Horizant continues to help/fim    HISTORY OF PRESENT ILLNESS:  Gloria Porter is a 59 year old woman with neuropathy and fibromyalgia   Polyneuropathy/restless legs:   She reports burning dysesthesias in her toes bilaterally. Water hitting foot is especially painful.    Lidocaine ointment has helped a lot but she now needs to use it nightly.    Pain is worse when laying down and is better if she moves around some.  .   She is also on Horizant with benefit.   She does better than gabapentin or Lyrica did and is better tolerated (dayitme sleepiness with gabapentin).    She has had DM since 1991 and is now on insulin.   Pain has not progressed much in past few years.        CTS?:  She notes some finger numbness.  Right hand goes numb with using a mouse  Fibromyalgia:  She notes widespread joint pain and myalgias that are usually mild but sometimes intensify.   Worse pain is in the calves nd inner thighs.   Current joint pain is worse in the hands.     She takes 2 hydrocodones most days.    She is currently on Horizant, Nortriptyline and Meloxicam for her pain (nortriptyline also for mood).      LBP:   She has achy axial back pain.   Occasionally, she will also have left leg pain, as well.  She has had lumbar surgery by Dr. Kennon Holter (Neurosurgery) in the past.   More recently, Dr. Vilinda Flake at [facility] has peroformed medial branch blocks but they did not help much.    Weight Loss:   Last year, we started phentermine.   She  notes appetite is reduced and she has lot some weight.   She tolerates it well.   She was diagnosed with OSA but cannot tolerate CPAP   Anxiety:   She gets anxious when there is more pain.   Dr. Erling Cruz prescribed lorazepam with benefit.     Other"   She had trigger finger surgery on the left.Marland Kitchen     REVIEW OF SYSTEMS: Constitutional: No fevers, chills, sweats, or change in appetite.  Notes fatigue and sleepiness Eyes: No visual changes, double vision, eye pain Ear, nose and throat: No hearing loss, ear pain, nasal congestion, sore throat Cardiovascular: No chest pain, palpitations Respiratory: No shortness of breath at rest or with exertion.   No wheezes.  Has OSA GastrointestinaI: No nausea, vomiting, diarrhea, abdominal pain, fecal incontinence Genitourinary: Notes urgency and rare incontiennce Musculoskeletal: as above Integumentary: No rash, pruritus, skin lesions Neurological: as above Psychiatric: Notes depression and some anxiety Endocrine: No palpitations, diaphoresis, change in appetite, change in weigh or increased thirst Hematologic/Lymphatic: No anemia, purpura, petechiae. Allergic/Immunologic: No itchy/runny eyes, nasal congestion, recent allergic reactions, rashes  ALLERGIES: Allergies  Allergen Reactions  . Atorvastatin   . Codeine Nausea And Vomiting  . Quinapril Hcl     HOME MEDICATIONS:  Current outpatient prescriptions:  .  ACCU-CHEK FASTCLIX LANCETS MISC, ,  Disp: , Rfl:  .  buPROPion (WELLBUTRIN SR) 150 MG 12 hr tablet, Take 150 mg by mouth daily., Disp: , Rfl:  .  BYDUREON 2 MG PEN, , Disp: , Rfl: 1 .  calcium carbonate (OS-CAL - DOSED IN MG OF ELEMENTAL CALCIUM) 1250 (500 CA) MG tablet, Take by mouth., Disp: , Rfl:  .  Calcium Carbonate-Vitamin D (CALCIUM + D PO), Take 1 tablet by mouth daily., Disp: , Rfl:  .  DEXILANT 60 MG capsule, , Disp: , Rfl:  .  empagliflozin (JARDIANCE) 10 MG TABS tablet, Take 10 mg by mouth., Disp: , Rfl:  .  furosemide (LASIX)  40 MG tablet, Take 40 mg by mouth daily., Disp: , Rfl:  .  HORIZANT 600 MG TBCR, Take 600 mg by mouth 2 (two) times daily., Disp: 180 tablet, Rfl: 3 .  HYDROcodone-acetaminophen (NORCO/VICODIN) 5-325 MG per tablet, Take 1/2 to 1 tablet, twice per day, if needed for pain, Disp: , Rfl:  .  insulin regular (NOVOLIN R,HUMULIN R) 100 units/mL injection, Inject into the skin 3 (three) times daily before meals. Has insulin pump, Disp: , Rfl:  .  lidocaine (XYLOCAINE) 2 % jelly, APPLY ONE TO TWO INCHES ONCE A DAY AS DIRECTED., Disp: 60 mL, Rfl: 6 .  losartan (COZAAR) 50 MG tablet, Take 50 mg by mouth., Disp: , Rfl:  .  meloxicam (MOBIC) 15 MG tablet, TAKE 1 TABLET (15 MG TOTAL) BY MOUTH DAILY., Disp: , Rfl:  .  montelukast (SINGULAIR) 10 MG tablet, Take 10 mg by mouth at bedtime., Disp: , Rfl:  .  nortriptyline (PAMELOR) 50 MG capsule, Take 2 at night, Disp: , Rfl:  .  phentermine 37.5 MG capsule, Take 1 capsule (37.5 mg total) by mouth daily., Disp: 30 capsule, Rfl: 5 .  thyroid (ARMOUR) 240 MG tablet, Take by mouth., Disp: , Rfl:  .  hydrocortisone (CORTEF) 10 MG tablet, Take 5-15 mg by mouth 2 (two) times daily. Takes 15 mg in the AM and 5 mg in the Pm, Disp: , Rfl:  .  hydroxychloroquine (PLAQUENIL) 200 MG tablet, Take 200 mg by mouth 2 (two) times daily., Disp: , Rfl:  .  Insulin Human (INSULIN PUMP) 100 unit/ml SOLN, Inject into the skin., Disp: , Rfl:  .  omeprazole (PRILOSEC) 40 MG capsule, Take 40 mg by mouth., Disp: , Rfl:  .  pregabalin (LYRICA) 50 MG capsule, Take 50-100 mg by mouth 3 (three) times daily. 50 mg in the AM, 100 MG at bedtime, Disp: , Rfl:  .  zolpidem (AMBIEN CR) 12.5 MG CR tablet, Take 12.5 mg by mouth at bedtime., Disp: , Rfl:   PAST MEDICAL HISTORY: Past Medical History  Diagnosis Date  . Lumbar disc disease   . Difficult intubation     2011 scratched trachea  . Fibromyalgia   . Neuropathy ([place])   . Sjogren's disease ([place])   . Sleep apnea     no cpap.sleep study 2006  [place]  . Cushing's disease ([place])     pituitary tumor removed 2012  . Diabetes mellitus     insulin pump.followed dr Peyton Bottoms [facility]  . Hypothyroidism   . GERD (gastroesophageal reflux disease)   . Headache(784.0)   . Arthritis   . Anxiety     PAST SURGICAL HISTORY: Past Surgical History  Procedure Laterality Date  . Transphenoidal / transnasal hypophysectomy / resection pituitary tumor    . Tonsillectomy    . Appendectomy    . Tubal ligation    .  Back surgery      lumbar fusion  . Endometrial ablation    . Trigger finger release    . Cesarean section    . Brain surgery    . Fracture surgery      l arm  . Lumbar laminectomy/decompression microdiscectomy  11/13/2011    Procedure: LUMBAR LAMINECTOMY/DECOMPRESSION MICRODISCECTOMY;  Surgeon: Faythe Ghee, MD;  Location: [facility];  Service: Neurosurgery;  Laterality: Bilateral;  Bilateral Lumbar four-five Decompression    FAMILY HISTORY: Family History  Problem Relation Age of Onset  . Diabetes type II Mother   . Diabetes type II Father   . Heart disease Father     SOCIAL HISTORY:  Social History   Social History  . Marital Status: Married    Spouse Name: N/A  . Number of Children: N/A  . Years of Education: N/A   Occupational History  . Not on file.   Social History Main Topics  . Smoking status: Former Smoker -- 1.00 packs/day for 30 years  . Smokeless tobacco: Not on file  . Alcohol Use: 0.6 oz/week    1 Glasses of wine per week  . Drug Use: No  . Sexual Activity: Not on file   Other Topics Concern  . Not on file   Social History Narrative     PHYSICAL EXAM  Filed Vitals:   07/13/15 1436  BP: 144/62  Pulse: 78  Resp: 18  Height: 5\' 3"  (1.6 m)  Weight: 242 lb 6.4 oz (109.952 kg)    Body mass index is 42.95 kg/(m^2).   General: The patient is well-developed and well-nourished and in no acute distress  Neck: The neck is supple, no carotid bruits are noted.  The neck  is mildly tender.   Skin: Extremities are without significant edema.  Musculoskeletal:  She is tender over some of the classic fibromyalgia tender points in the upper chest and back.   Neurologic Exam  Mental status: The patient is alert and oriented x 3 at the time of the examination. The patient has apparent normal recent and remote memory, with an apparently normal attention span and concentration ability.   Speech is normal.  Cranial nerves: Extraocular movements are full.  Facial symmetry is present. There is good facial sensation to soft touch bilaterally.Facial strength is normal.  Trapezius and sternocleidomastoid strength is normal. No dysarthria is noted.  The tongue is midline, and the patient has symmetric elevation of the soft palate. No obvious hearing deficits are noted.  Motor:  Muscle bulk is normal.   Tone is normal. Strength is  5 / 5 in all 4 extremities except 4+/5 right APB in the hand.   Sensory: Sensory testing is intact to pinprick, soft touch and vibration sensation in the arms. She has mildly reduced and vibration sensation in the toes.  Coordination: Cerebellar testing reveals good finger-nose-finger and heel-to-shin bilaterally.  Gait and station: Station is normal.   Gait is normal. Tandem gait is normal. Romberg is negative.   Reflexes: Deep tendon reflexes are symmetric and 1+ bilaterally.     Other:   She has bilateral Tinel's signs and Phalen's signs at her wrist    DIAGNOSTIC DATA (LABS, IMAGING, TESTING) - I reviewed patient records, labs, notes, testing and imaging myself where available.  Lab Results  Component Value Date   WBC 11.8* 11/08/2011   HGB 13.2 11/08/2011   HCT 40.0 11/08/2011   MCV 82.6 11/08/2011   PLT 308 11/08/2011  Component Value Date/Time   NA 137 11/08/2011 1522   K 3.8 11/08/2011 1522   CL 98 11/08/2011 1522   CO2 27 11/08/2011 1522   GLUCOSE 162* 11/08/2011 1522   BUN 13 11/08/2011 1522   CREATININE 0.59  11/08/2011 1522   CALCIUM 9.9 11/08/2011 1522   PROT 8.0 11/08/2011 1522   ALBUMIN 3.7 11/08/2011 1522   AST 33 11/08/2011 1522   ALT 25 11/08/2011 1522   ALKPHOS 87 11/08/2011 1522   BILITOT 0.2* 11/08/2011 1522   GFRNONAA >90 11/08/2011 1522   GFRAA >90 11/08/2011 1522      ASSESSMENT AND PLAN  Neuropathy (HCC)  Bilateral carpal tunnel syndrome  Fibromyalgia  Clinical depression  Anxiety, generalized  Morbid obesity, unspecified obesity type ([place])  Restless leg  Spinal stenosis, unspecified spinal region  Obstructive apnea    1.   Refill lidocaine at higher amount, refill phentermine for weight loss. 2.   Refill Horizant for polyneuropathy and restless leg pain. 3.  Inject right carpal tunnel with milligram Decadron in 0.8 mL lidocaine using sterile technique. She tolerated the procedure well.  There were no complications.. 4.   She is advised to stay active and exercises as tolerated. Hopefully, she will be able to exercise more after her back pain improves.  45 minute face-to-face evaluation with greater than one half of the time counseling according to care about her multiple symptoms and prognosis.  She will return to see [place] in a month or sooner if she has new or worsening neurologic symptoms.   Richard A. Felecia Shelling, MD, PhD [id number], [id number] Certified in Neurology, Clinical Neurophysiology, Sleep Medicine, Pain Medicine and Neuroimaging  [facility] [address], [place], [place] [address] [phone]

## 2015-12-06 ENCOUNTER — Other Ambulatory Visit: Payer: Self-pay | Admitting: *Deleted

## 2015-12-06 MED ORDER — HORIZANT 600 MG PO TBCR: 600.0000 mg | EXTENDED_RELEASE_TABLET | Freq: Two times a day (BID) | ORAL | Status: DC

## 2016-01-11 ENCOUNTER — Encounter: Payer: Self-pay | Admitting: Neurology

## 2016-01-11 ENCOUNTER — Ambulatory Visit (INDEPENDENT_AMBULATORY_CARE_PROVIDER_SITE_OTHER): Admitting: Neurology

## 2016-01-11 VITALS — BP 126/66 | HR 78 | Resp 16 | Ht 63.0 in | Wt 238.0 lb

## 2016-01-11 DIAGNOSIS — Z794 Long term (current) use of insulin: Secondary | ICD-10-CM | POA: Diagnosis not present

## 2016-01-11 DIAGNOSIS — E1142 Type 2 diabetes mellitus with diabetic polyneuropathy: Secondary | ICD-10-CM

## 2016-01-11 DIAGNOSIS — G629 Polyneuropathy, unspecified: Secondary | ICD-10-CM | POA: Diagnosis not present

## 2016-01-11 DIAGNOSIS — G4733 Obstructive sleep apnea (adult) (pediatric): Secondary | ICD-10-CM

## 2016-01-11 DIAGNOSIS — M797 Fibromyalgia: Secondary | ICD-10-CM

## 2016-01-11 DIAGNOSIS — D472 Monoclonal gammopathy: Secondary | ICD-10-CM | POA: Diagnosis not present

## 2016-01-11 DIAGNOSIS — G2581 Restless legs syndrome: Secondary | ICD-10-CM | POA: Diagnosis not present

## 2016-01-11 MED ORDER — PHENTERMINE HCL 37.5 MG PO CAPS: 37.5000 mg | ORAL_CAPSULE | Freq: Every day | ORAL | Status: DC

## 2016-01-11 MED ORDER — LIDOCAINE HCL 2 % EX GEL: Freq: Every day | CUTANEOUS | Status: DC

## 2016-01-11 NOTE — Progress Notes (Signed)
 [facility]  PATIENT: Gloria Porter DOB: 01/20/1956  REFERRING DOCTOR OR PCP:  Gretchen Velazquez SOURCE: paitent  _________________________________   HISTORICAL  CHIEF COMPLAINT:  Chief Complaint  Patient presents with  . Peripheral Neuropathy    Sts. neuropathy is well controlled with Horizant and Lidocaine gel.  She would like to increase quantity of Lidocaine she gets each month--would like to get 4 tubes. Since last ov, she's had right carpal tunnel release./fim    HISTORY OF PRESENT ILLNESS:  Gloria Porter is a 60-year-old woman with neuropathy and fibromyalgia.      Polyneuropathy/restless legs:   She reports burning dysesthesias in her toes bilaterally. Pain is mild when she is busy but worse when resting or when she is sitting at desk.    Horizant with Lidocaine ointment has helped a lot.    Pain is worse when laying down and is better if she moves around some.  She tolerates her med's well    She has had DM since 1991 and is now on insulin.   Pain has not progressed much in past few years.        RLS at night is better on Horizant.   She had an elevated M-spike on SPEP and is seeing Dr. Sanders in [place].    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    Bone survey was normal.   She has a family history of Multiple Myeloma  OSA:   She has OSA and used to use CPAP.   She stopped for a bad cold last year and had trouble resuming so has not restarted.     Insomnia:   She falls asleep easy most nights.  However some nights she is up many hours.   She awakens 3-4 times most nights to urinate.   Fibromyalgia:  She notes widespread joint pain and myalgias.  On her med's pain is usually mild but sometimes intensify.   Her worse pain is in the calves and inner thighs.   Current joint pain is worse in the hands.     She ha snot needed as much hydrocodone.     She is currently on Horizant, Nortriptyline and Meloxicam for her pain (nortriptyline also for mood).      LBP:   She  has axial back pain and occasional left leg pain.  She has had lumbar surgery by Dr. Kritzker (Neurosurgery) in the past.     Weight Loss:   Last year, we started phentermine.   She notes appetite is reduced and she has lot some weight.   She tolerates it well.    Anxiety/stress:   She gets anxious when there is more pain.   Dr. Love prescribed lorazepam with benefit (she does not take frequently).   She is under more stress with her mom diagnosed with Ramsay Hunt syndrome and possible stroke.    Her mom is not eating well (needing IV hydration several times) and has had other medical issues   REVIEW OF SYSTEMS: Constitutional: No fevers, chills, sweats, or change in appetite.  Notes fatigue and sleepiness Eyes: No visual changes, double vision, eye pain Ear, nose and throat: No hearing loss, ear pain, nasal congestion, sore throat Cardiovascular: No chest pain, palpitations Respiratory: No shortness of breath at rest or with exertion.   No wheezes.  Has OSA GastrointestinaI: No nausea, vomiting, diarrhea, abdominal pain, fecal incontinence Genitourinary: Notes urgency and rare incontiennce Musculoskeletal: as above Integumentary: No rash, pruritus, skin lesions Neurological:   as above Psychiatric: Notes depression and some anxiety Endocrine: No palpitations, diaphoresis, change in appetite, change in weigh or increased thirst Hematologic/Lymphatic: No anemia, purpura, petechiae. Allergic/Immunologic: No itchy/runny eyes, nasal congestion, recent allergic reactions, rashes  ALLERGIES: Allergies  Allergen Reactions  . Atorvastatin   . Codeine Nausea And Vomiting  . Quinapril Hcl     HOME MEDICATIONS:  Current outpatient prescriptions:  .  ACCU-CHEK FASTCLIX LANCETS MISC, , Disp: , Rfl:  .  buPROPion (WELLBUTRIN SR) 150 MG 12 hr tablet, Take 150 mg by mouth daily., Disp: , Rfl:  .  BYDUREON 2 MG PEN, , Disp: , Rfl: 1 .  calcium carbonate (OS-CAL - DOSED IN MG OF ELEMENTAL  CALCIUM) 1250 (500 CA) MG tablet, Take by mouth., Disp: , Rfl:  .  Calcium Carbonate-Vitamin D (CALCIUM + D PO), Take 1 tablet by mouth daily., Disp: , Rfl:  .  DEXILANT 60 MG capsule, , Disp: , Rfl:  .  empagliflozin (JARDIANCE) 10 MG TABS tablet, Take 10 mg by mouth., Disp: , Rfl:  .  furosemide (LASIX) 40 MG tablet, Take 40 mg by mouth daily., Disp: , Rfl:  .  HORIZANT 600 MG TBCR, Take 600 mg by mouth 2 (two) times daily., Disp: 180 tablet, Rfl: 3 .  HYDROcodone-acetaminophen (NORCO/VICODIN) 5-325 MG per tablet, Take 1/2 to 1 tablet, twice per day, if needed for pain, Disp: , Rfl:  .  hydrocortisone (CORTEF) 10 MG tablet, Take 5-15 mg by mouth 2 (two) times daily. Takes 15 mg in the AM and 5 mg in the Pm, Disp: , Rfl:  .  insulin regular (NOVOLIN R,HUMULIN R) 100 units/mL injection, Inject into the skin 3 (three) times daily before meals. Has insulin pump, Disp: , Rfl:  .  lidocaine (XYLOCAINE) 2 % jelly, Apply topically daily., Disp: 90 mL, Rfl: 11 .  meloxicam (MOBIC) 15 MG tablet, TAKE 1 TABLET (15 MG TOTAL) BY MOUTH DAILY., Disp: , Rfl:  .  montelukast (SINGULAIR) 10 MG tablet, Take 10 mg by mouth at bedtime., Disp: , Rfl:  .  nortriptyline (PAMELOR) 50 MG capsule, Take 2 at night, Disp: , Rfl:  .  phentermine 37.5 MG capsule, Take 1 capsule (37.5 mg total) by mouth daily., Disp: 30 capsule, Rfl: 5 .  thyroid (ARMOUR) 240 MG tablet, Take by mouth., Disp: , Rfl:  .  zolpidem (AMBIEN CR) 12.5 MG CR tablet, Take 12.5 mg by mouth at bedtime., Disp: , Rfl:  .  hydroxychloroquine (PLAQUENIL) 200 MG tablet, Take 200 mg by mouth 2 (two) times daily. Reported on 01/11/2016, Disp: , Rfl:  .  Insulin Human (INSULIN PUMP) 100 unit/ml SOLN, Inject into the skin. Reported on 01/11/2016, Disp: , Rfl:  .  losartan (COZAAR) 50 MG tablet, Take 50 mg by mouth., Disp: , Rfl:  .  omeprazole (PRILOSEC) 40 MG capsule, Take 40 mg by mouth., Disp: , Rfl:   PAST MEDICAL HISTORY: Past Medical History  Diagnosis  Date  . Lumbar disc disease   . Difficult intubation     2011 scratched trachea  . Fibromyalgia   . Neuropathy (HCC)   . Sjogren's disease (HCC)   . Sleep apnea     no cpap.sleep study 2006 [facility]  . Cushing's disease (HCC)     pituitary tumor removed 2012  . Diabetes mellitus     insulin pump.followed dr wm smith.[facility]  . Hypothyroidism   . GERD (gastroesophageal reflux disease)   . Headache(784.0)   . Arthritis   .   Anxiety     PAST SURGICAL HISTORY: Past Surgical History  Procedure Laterality Date  . Transphenoidal / transnasal hypophysectomy / resection pituitary tumor    . Tonsillectomy    . Appendectomy    . Tubal ligation    . Back surgery      lumbar fusion  . Endometrial ablation    . Trigger finger release    . Cesarean section    . Brain surgery    . Fracture surgery      l arm  . Lumbar laminectomy/decompression microdiscectomy  11/13/2011    Procedure: LUMBAR LAMINECTOMY/DECOMPRESSION MICRODISCECTOMY;  Surgeon: Randy O Kritzer, MD;  Location: [facility];  Service: Neurosurgery;  Laterality: Bilateral;  Bilateral Lumbar four-five Decompression    FAMILY HISTORY: Family History  Problem Relation Age of Onset  . Diabetes type II Mother   . Diabetes type II Father   . Heart disease Father     SOCIAL HISTORY:  Social History   Social History  . Marital Status: Married    Spouse Name: N/A  . Number of Children: N/A  . Years of Education: N/A   Occupational History  . Not on file.   Social History Main Topics  . Smoking status: Former Smoker -- 1.00 packs/day for 30 years  . Smokeless tobacco: Not on file  . Alcohol Use: 0.6 oz/week    1 Glasses of wine per week  . Drug Use: No  . Sexual Activity: Not on file   Other Topics Concern  . Not on file   Social History Narrative     PHYSICAL EXAM  Filed Vitals:   01/11/16 1524  BP: 126/66  Pulse: 78  Resp: 16  Height: 5' 3" (1.6 m)  Weight: 238 lb (107.956 kg)     Body mass index is 42.17 kg/(m^2).   General: The patient is well-de   Skin: Extremities are without significant edema.  Musculoskeletal:  She is tender over some of the classic fibromyalgia tender points in the upper chest and back.   Neurologic Exam  Mental status: The patient is alert and oriented x 3 at the time of the examination. The patient has apparent normal recent and remote memory, with an apparently normal attention span and concentration ability.   Speech is normal.  Cranial nerves: Extraocular movements are full.  Facial symmetry is present. There is good facial sensation to soft touch bilaterally.Facial strength is normal.  Trapezius and sternocleidomastoid strength is normal. No dysarthria is noted.  The tongue is midline, and the patient has symmetric elevation of the soft palate. No obvious hearing deficits are noted.  Motor:  Muscle bulk is normal.   Tone is normal. Strength is  5 / 5 in all 4 extremities.   Sensory: Sensory testing is intact to pinprick, soft touch and vibration sensation in the arms. She has mildly reduced vibration sensation in the toes.  Coordination: Cerebellar testing reveals good finger-nose-finger and heel-to-shin bilaterally.  Gait and station: Station is normal.   Gait is normal. Tandem gait is normal. Romberg is negative.   Reflexes: Deep tendon reflexes are symmetric and 1+ bilaterally.        DIAGNOSTIC DATA (LABS, IMAGING, TESTING) - I reviewed patient records, labs, notes, testing and imaging myself where available.  Lab Results  Component Value Date   WBC 11.8* 11/08/2011   HGB 13.2 11/08/2011   HCT 40.0 11/08/2011   MCV 82.6 11/08/2011   PLT 308 11/08/2011      Component   Value Date/Time   NA 137 11/08/2011 1522   K 3.8 11/08/2011 1522   CL 98 11/08/2011 1522   CO2 27 11/08/2011 1522   GLUCOSE 162* 11/08/2011 1522   BUN 13 11/08/2011 1522   CREATININE 0.59 11/08/2011 1522   CALCIUM 9.9 11/08/2011 1522   PROT 8.0  11/08/2011 1522   ALBUMIN 3.7 11/08/2011 1522   AST 33 11/08/2011 1522   ALT 25 11/08/2011 1522   ALKPHOS 87 11/08/2011 1522   BILITOT 0.2* 11/08/2011 1522   GFRNONAA >90 11/08/2011 1522   GFRAA >90 11/08/2011 1522      ASSESSMENT AND PLAN  Type 2 diabetes mellitus with diabetic polyneuropathy, with long-term current use of insulin (HCC)  Neuropathy (HCC)  Fibromyalgia  Restless leg  Obstructive apnea  Monoclonal paraproteinemia    1.   Refill lidocaine at higher amount 2.   Continue Horizant for polyneuropathy and restless leg pain. 3.   She is advised to stay active and exercises as tolerated.  4.   RTC 6 months, sooner if she has new or worsening neurologic symptoms.   Kayde Atkerson A. Felecia Shelling, MD, PhD 5/40/9811, 9:14 PM Certified in Neurology, Clinical Neurophysiology, Sleep Medicine, Pain Medicine and Neuroimaging  [facility] [address], [place], [place] [address] [phone]  7

## 2016-05-24 ENCOUNTER — Other Ambulatory Visit: Payer: Self-pay | Admitting: Orthopedic Surgery

## 2016-05-24 DIAGNOSIS — M25531 Pain in right wrist: Secondary | ICD-10-CM

## 2016-06-07 ENCOUNTER — Ambulatory Visit
Admission: RE | Admit: 2016-06-07 | Discharge: 2016-06-07 | Disposition: A | Discharge status: Home / Self Care | Source: Ambulatory Visit | Attending: Orthopedic Surgery | Admitting: Orthopedic Surgery

## 2016-06-07 DIAGNOSIS — M25531 Pain in right wrist: Secondary | ICD-10-CM

## 2016-06-07 MED ORDER — IOPAMIDOL (ISOVUE-M 200) INJECTION 41%
1.5000 mL | Freq: Once | INTRAMUSCULAR | Status: AC
  Administered 2016-06-07: 1.5 mL via INTRA_ARTICULAR

## 2016-07-12 ENCOUNTER — Ambulatory Visit: Admitting: Neurology

## 2016-07-12 ENCOUNTER — Encounter: Payer: Self-pay | Admitting: Neurology

## 2016-07-12 ENCOUNTER — Ambulatory Visit (INDEPENDENT_AMBULATORY_CARE_PROVIDER_SITE_OTHER): Admitting: Neurology

## 2016-07-12 VITALS — BP 142/60 | HR 80 | Resp 18 | Ht 63.0 in | Wt 238.0 lb

## 2016-07-12 DIAGNOSIS — M48061 Spinal stenosis, lumbar region without neurogenic claudication: Secondary | ICD-10-CM

## 2016-07-12 DIAGNOSIS — E1142 Type 2 diabetes mellitus with diabetic polyneuropathy: Secondary | ICD-10-CM

## 2016-07-12 DIAGNOSIS — G629 Polyneuropathy, unspecified: Secondary | ICD-10-CM | POA: Diagnosis not present

## 2016-07-12 DIAGNOSIS — M797 Fibromyalgia: Secondary | ICD-10-CM

## 2016-07-12 DIAGNOSIS — D472 Monoclonal gammopathy: Secondary | ICD-10-CM | POA: Diagnosis not present

## 2016-07-12 DIAGNOSIS — G4733 Obstructive sleep apnea (adult) (pediatric): Secondary | ICD-10-CM

## 2016-07-12 DIAGNOSIS — G2581 Restless legs syndrome: Secondary | ICD-10-CM | POA: Diagnosis not present

## 2016-07-12 MED ORDER — PHENTERMINE HCL 37.5 MG PO CAPS: 37.5000 mg | ORAL_CAPSULE | Freq: Every day | ORAL | 5 refills | Status: DC

## 2016-07-12 MED ORDER — IMIPRAMINE HCL 25 MG PO TABS: ORAL_TABLET | ORAL | 5 refills | Status: DC

## 2016-07-12 NOTE — Progress Notes (Signed)
[facility]  PATIENT: Gloria Porter DOB: 1956/03/15  REFERRING DOCTOR OR PCP:  Idamae Schuller SOURCE: paitent  _________________________________   HISTORICAL  CHIEF COMPLAINT:  Chief Complaint  Patient presents with  . Fibromyalgia    Sts.she continues Horizant and Lidocaine gel for neuropathy.  Since last ov, she's had left carpal tunnel release./fim  . Peripheral Neuropathy    HISTORY OF PRESENT ILLNESS:  Gloria Porter is a 60 y.o. woman with neuropathy and fibromyalgia.      Polyneuropathy/restless legs:   She has burning dysesthesias in her toes bilaterally. She notes pain more when she is less busy and not moving.   Pain is stabbing in quality.    Horizant with Lidocaine ointment has helped a lot --- better than other options tried earlier.     She tolerates her med's well    She has had DM since 1991 and is now on insulin.   Pain has not progressed much in past few years.        RLS at night is better on Horizant.   She had an elevated M-spike on SPEP and is seeing Dr. Baird [facility] in [facility].    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  Recent SPEP/IEF reconfirms this.    Bone survey was normal.   She has a family history of Multiple Myeloma  OSA/Insomnia:   She has OSA and used to use CPAP.   She stopped for a bad cold last year and had trouble resuming so has not restarted.   She falls asleep easy most nights.  However some nights she is up many hours.   She awakens 3-4 times most nights to urinate.   She often has trouble falling back asleep.  Fatigue:   She had low cortisol and she had an ACTH stim test recently which was reportedly blunted but not abnormal.     Fibromyalgia:  She has joint pain and myalgias, mood disturbance and sleep issues.  On her med's pain is usually mild but sometimes intensify.   Her worse pain is in the calves and inner thighs.   Current joint pain is worse in the hands.     She has not needed as much hydrocodone.     She is currently on  Horizant, Nortriptyline and Meloxicam for her pain (nortriptyline also for mood).      LBP/Neck pain:   More recently, she has had neck pain and wrist pain.   A steroid pack hellped a lot but NSAIDs have not.  She has axial back pain and occasional left leg pain.  She has had lumbar surgery by Dr. Kennon Holter (Neurosurgery) in the past.     She sees Delrae Alfred, PT for physical therapy and feels it might help.    CTS:   She has had bilateral CTR over the past year.     Weight Loss:   Last year, we started phentermine and she lost 15 pounds but regained it back when placed on prednisone.   She stopped before her surgery.   She tolerates it well.    Anxiety/stress:   She gets anxious an notes more stress (her mother in law has cancer and she and her husband travel there a lot).     REVIEW OF SYSTEMS: Constitutional: No fevers, chills, sweats, or change in appetite.  Notes fatigue and sleepiness Eyes: No visual changes, double vision, eye pain Ear, nose and throat: No hearing loss, ear pain, nasal congestion, sore throat Cardiovascular: No  chest pain, palpitations Respiratory: No shortness of breath at rest or with exertion.   No wheezes.  Has OSA GastrointestinaI: No nausea, vomiting, diarrhea, abdominal pain, fecal incontinence Genitourinary: Notes urgency and rare incontiennce Musculoskeletal: as above Integumentary: No rash, pruritus, skin lesions Neurological: as above Psychiatric: Notes depression and some anxiety Endocrine: No palpitations, diaphoresis, change in appetite, change in weigh or increased thirst Hematologic/Lymphatic: No anemia, purpura, petechiae. Allergic/Immunologic: No itchy/runny eyes, nasal congestion, recent allergic reactions, rashes  ALLERGIES: Allergies  Allergen Reactions  . Atorvastatin   . Codeine Nausea And Vomiting  . Quinapril Hcl     HOME MEDICATIONS:  Current Outpatient Prescriptions:  .  ACCU-CHEK FASTCLIX LANCETS MISC, , Disp: , Rfl:  .   buPROPion (WELLBUTRIN SR) 150 MG 12 hr tablet, Take 150 mg by mouth daily., Disp: , Rfl:  .  BYDUREON 2 MG PEN, , Disp: , Rfl: 1 .  calcium carbonate (OS-CAL - DOSED IN MG OF ELEMENTAL CALCIUM) 1250 (500 CA) MG tablet, Take by mouth., Disp: , Rfl:  .  Calcium Carbonate-Vitamin D (CALCIUM + D PO), Take 1 tablet by mouth daily., Disp: , Rfl:  .  DEXILANT 60 MG capsule, , Disp: , Rfl:  .  empagliflozin (JARDIANCE) 10 MG TABS tablet, Take 10 mg by mouth., Disp: , Rfl:  .  furosemide (LASIX) 40 MG tablet, Take 40 mg by mouth daily., Disp: , Rfl:  .  HORIZANT 600 MG TBCR, Take 600 mg by mouth 2 (two) times daily., Disp: 180 tablet, Rfl: 3 .  HYDROcodone-acetaminophen (NORCO/VICODIN) 5-325 MG per tablet, Take 1/2 to 1 tablet, twice per day, if needed for pain, Disp: , Rfl:  .  insulin regular (NOVOLIN R,HUMULIN R) 100 units/mL injection, Inject into the skin 3 (three) times daily before meals. Has insulin pump, Disp: , Rfl:  .  lidocaine (XYLOCAINE) 2 % jelly, Apply topically daily., Disp: 120 mL, Rfl: 11 .  montelukast (SINGULAIR) 10 MG tablet, Take 10 mg by mouth at bedtime., Disp: , Rfl:  .  phentermine 37.5 MG capsule, Take 1 capsule (37.5 mg total) by mouth daily., Disp: 30 capsule, Rfl: 5 .  thyroid (ARMOUR) 240 MG tablet, Take by mouth., Disp: , Rfl:  .  imipramine (TOFRANIL) 25 MG tablet, Take one or two po at bedtime, Disp: 60 tablet, Rfl: 5 .  losartan (COZAAR) 50 MG tablet, Take 50 mg by mouth., Disp: , Rfl:  .  omeprazole (PRILOSEC) 40 MG capsule, Take 40 mg by mouth., Disp: , Rfl:  .  zolpidem (AMBIEN CR) 12.5 MG CR tablet, Take 12.5 mg by mouth at bedtime., Disp: , Rfl:   PAST MEDICAL HISTORY: Past Medical History:  Diagnosis Date  . Anxiety   . Arthritis   . Cushing's disease ([address])    pituitary tumor removed 2012  . Diabetes mellitus    insulin pump.followed dr Peyton Bottoms smith.[facility]  . Difficult intubation    2011 scratched trachea  . Fibromyalgia   . GERD  (gastroesophageal reflux disease)   . Headache(784.0)   . Hypothyroidism   . Lumbar disc disease   . Neuropathy ([place])   . Sjogren's disease ([place])   . Sleep apnea    no cpap.sleep study 2006 [facility]    PAST SURGICAL HISTORY: Past Surgical History:  Procedure Laterality Date  . APPENDECTOMY    . BACK SURGERY     lumbar fusion  . BRAIN SURGERY    . CESAREAN SECTION    . ENDOMETRIAL ABLATION    .  FRACTURE SURGERY     l arm  . LUMBAR LAMINECTOMY/DECOMPRESSION MICRODISCECTOMY  11/13/2011   Procedure: LUMBAR LAMINECTOMY/DECOMPRESSION MICRODISCECTOMY;  Surgeon: Faythe Ghee, MD;  Location: [facility];  Service: Neurosurgery;  Laterality: Bilateral;  Bilateral Lumbar four-five Decompression  . TONSILLECTOMY    . TRANSPHENOIDAL / TRANSNASAL HYPOPHYSECTOMY / RESECTION PITUITARY TUMOR    . TRIGGER FINGER RELEASE    . TUBAL LIGATION      FAMILY HISTORY: Family History  Problem Relation Age of Onset  . Diabetes type II Mother   . Diabetes type II Father   . Heart disease Father     SOCIAL HISTORY:  Social History   Social History  . Marital status: Married    Spouse name: N/A  . Number of children: N/A  . Years of education: N/A   Occupational History  . Not on file.   Social History Main Topics  . Smoking status: Former Smoker    Packs/day: 1.00    Years: 30.00  . Smokeless tobacco: Not on file  . Alcohol use 0.6 oz/week    1 Glasses of wine per week  . Drug use: No  . Sexual activity: Not on file   Other Topics Concern  . Not on file   Social History Narrative  . No narrative on file     PHYSICAL EXAM  Vitals:   07/12/16 1437  BP: (!) 142/60  Pulse: 80  Resp: 18  Weight: 238 lb (108 kg)  Height: 5' 3"  (1.6 m)    Body mass index is 42.16 kg/m.   General: The patient is well-de   Skin: Extremities are without significant edema.  Musculoskeletal:  She is tender over some of the classic fibromyalgia tender points in the upper chest and  back.   Neurologic Exam  Mental status: The patient is alert and oriented x 3 at the time of the examination. The patient has apparent normal recent and remote memory, with an apparently normal attention span and concentration ability.   Speech is normal.  Cranial nerves: Extraocular movements are full.   There is good facial sensation to soft touch bilaterally.Facial strength is normal.  Trapezius and sternocleidomastoid strength is normal. No dysarthria is noted.  The tongue is midline, and the patient has symmetric elevation of the soft palate. No obvious hearing deficits are noted.  Motor:  Muscle bulk is normal.   Tone is normal. Strength is  5 / 5 in all 4 extremities.   Sensory: Sensory testing is intact to soft touch and vibration sensation in the arms. She has reduced vibration sensation in the toes and mildly reduced at ankles.  Coordination: Cerebellar testing reveals good finger-nose-finger and heel-to-shin bilaterally.  Gait and station: Station is normal.   Gait is normal. Tandem gait is normal. Romberg is negative.   Reflexes: Deep tendon reflexes are symmetric and 1+ bilaterally.        DIAGNOSTIC DATA (LABS, IMAGING, TESTING) - I reviewed patient records, labs, notes, testing and imaging myself where available.  Lab Results  Component Value Date   WBC 11.8 (H) 11/08/2011   HGB 13.2 11/08/2011   HCT 40.0 11/08/2011   MCV 82.6 11/08/2011   PLT 308 11/08/2011      Component Value Date/Time   NA 137 11/08/2011 1522   K 3.8 11/08/2011 1522   CL 98 11/08/2011 1522   CO2 27 11/08/2011 1522   GLUCOSE 162 (H) 11/08/2011 1522   BUN 13 11/08/2011 1522   CREATININE  0.59 11/08/2011 1522   CALCIUM 9.9 11/08/2011 1522   PROT 8.0 11/08/2011 1522   ALBUMIN 3.7 11/08/2011 1522   AST 33 11/08/2011 1522   ALT 25 11/08/2011 1522   ALKPHOS 87 11/08/2011 1522   BILITOT 0.2 (L) 11/08/2011 1522   GFRNONAA >90 11/08/2011 1522   GFRAA >90 11/08/2011 1522      ASSESSMENT AND  PLAN  Neuropathy (HCC)  Type 2 diabetes mellitus with diabetic polyneuropathy, unspecified long term insulin use status (HCC)  Obstructive apnea  Fibromyalgia  Monoclonal paraproteinemia  Spinal stenosis of lumbar region, unspecified whether neurogenic claudication present  Restless leg   1.   Continue lidocaine and Horizant for polyneuropathy and restless leg pain. 2.    Change nortriptyline to imipramine as it might help insomnia and nocturia 3.   Continue phentermine 4.   She is advised to stay active and exercises as tolerated.  5.   RTC 6 months, sooner if she has new or worsening neurologic symptoms.   Odalis Jordan A. Felecia Shelling, MD, PhD 30/14/9969, 2:49 PM Certified in Neurology, Clinical Neurophysiology, Sleep Medicine, Pain Medicine and Neuroimaging  [facility] [address] [place], [place] [address] [phone]  7

## 2016-12-12 ENCOUNTER — Other Ambulatory Visit: Payer: Self-pay | Admitting: Neurology

## 2017-01-15 ENCOUNTER — Encounter: Payer: Self-pay | Admitting: Neurology

## 2017-01-15 ENCOUNTER — Ambulatory Visit (INDEPENDENT_AMBULATORY_CARE_PROVIDER_SITE_OTHER): Admitting: Neurology

## 2017-01-15 ENCOUNTER — Encounter (INDEPENDENT_AMBULATORY_CARE_PROVIDER_SITE_OTHER): Payer: Self-pay

## 2017-01-15 VITALS — BP 151/75 | HR 89 | Resp 18 | Ht 63.0 in | Wt 243.0 lb

## 2017-01-15 DIAGNOSIS — G629 Polyneuropathy, unspecified: Secondary | ICD-10-CM

## 2017-01-15 DIAGNOSIS — G4733 Obstructive sleep apnea (adult) (pediatric): Secondary | ICD-10-CM

## 2017-01-15 DIAGNOSIS — D472 Monoclonal gammopathy: Secondary | ICD-10-CM | POA: Diagnosis not present

## 2017-01-15 DIAGNOSIS — Z794 Long term (current) use of insulin: Secondary | ICD-10-CM

## 2017-01-15 DIAGNOSIS — M797 Fibromyalgia: Secondary | ICD-10-CM | POA: Diagnosis not present

## 2017-01-15 DIAGNOSIS — E1142 Type 2 diabetes mellitus with diabetic polyneuropathy: Secondary | ICD-10-CM

## 2017-01-15 MED ORDER — HORIZANT 600 MG PO TBCR: 600.0000 mg | EXTENDED_RELEASE_TABLET | Freq: Two times a day (BID) | ORAL | 3 refills | Status: DC

## 2017-01-15 MED ORDER — IMIPRAMINE HCL 25 MG PO TABS: ORAL_TABLET | ORAL | 4 refills | Status: DC

## 2017-01-15 MED ORDER — LIDOCAINE 5 % EX OINT: 1.0000 "application " | TOPICAL_OINTMENT | CUTANEOUS | 11 refills | Status: DC | PRN

## 2017-01-15 NOTE — Progress Notes (Signed)
[facility]  PATIENT: Gloria Porter DOB: [birth year]  REFERRING DOCTOR OR PCP:  Idamae Schuller SOURCE: paitent  _________________________________   HISTORICAL  CHIEF COMPLAINT:  Chief Complaint  Patient presents with  . Fibromyalgia    Sts. has had more generalized joint pain.  Saw pcp and had labs--CRP 35.2, ANA was positive,   PCP has referred her to rheumatology--she has an appt. with Dr. Keturah Barre at [place] in July/fim  . Peripheral Neuropathy  . Sleep Apnea    HISTORY OF PRESENT ILLNESS:  Gloria Porter is a 61 y.o. woman with neuropathy and fibromyalgia.      Joint pain:   She is noting more generalized joint pain.  This has worsened over the past year.  Pain is worse in the wrists and fingers and ankles .  Bending her fingers is painful.   She sees Dr. Gerilyn Nestle but no further recommendations were made.   She had a carpal tunnel injection without benefit.     She has been referred to Dr Rochele Pages at [facility] (Pain management) for additional services.  Dilaudid had been prescribed to her previously.     She reports being diagnosed with Sjogren's in the past.   A recent ANA was 1:160 (speckled and homogenous) and her CRP is also elevated.     Polyneuropathy/restless legs:   She reports a burning dysesthetic pain and RLS.   Pain is stabbing in quality. Pain is worse when she is not moving.   Shifting around helps.      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    She had an elevated M-spike on SPEP and is seeing Dr. Baird Cancer in [facility].    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    Bone survey was normal.   She has a family history of Multiple Myeloma  OSA/Insomnia:   She has OSA and used to use CPAP.   She stopped for a bad cold last year and had trouble resuming so has not restarted.   She falls asleep easy most  nights.  However some nights she is up many hours.   She awakens 3-4 times most nights to urinate.   She often has trouble falling back asleep.    Insomnia initially did better with imipramine but is now not doing as well.     She is sleepy during the day and takes naps.  EPWORTH SLEEPINESS SCALE  On a scale of 0 - 3 what is the chance of dozing:  Sitting and Reading:   3 Watching TV:    1 Sitting inactive in a public place: 0 Passenger in car for one hour: 3 Lying down to rest in the afternoon: 3 Sitting and talking to someone: 0 Sitting quietly after lunch:  1 In a car, stopped in traffic:  0  Total (out of 24):   11/24   Fatigue:   She had low cortisol and she had an ACTH stim test recently which was reportedly blunted but not abnormal.     Fibromyalgia:  Besides joint pain she also has mylagias. She also has dperession and poor sleep.   .  On her med's pain is usually mild but sometimes intensify.   Her worse pain is in the calves and inner thighs.   Current joint pain  is worse in the hands.     She has not needed as much hydrocodone.     She is currently on Horizant, imipramine and meloxicam.     LBP/Neck pain:   More recently, she has had neck pain and wrist pain.   A steroid pack hellped a lot but NSAIDs have not.  She has axial back pain and occasional left leg pain.  She has had lumbar surgery by Dr. Kennon Holter (Neurosurgery) in the past.     She sees Delrae Alfred, PT for physical therapy and feels it might help.    CTS:   She has had bilateral CTR over the past year.     Weight Loss:   Last year, we started phentermine and she lost 15 pounds but regained it back when placed on prednisone.   She stopped before her surgery.   She tolerates it well.    Anxiety/stress:   She gets anxious an notes more stress (her mother in law has cancer and she and her husband travel there a lot).     REVIEW OF SYSTEMS: Constitutional: No fevers, chills, sweats, or change in appetite.  Notes fatigue  and sleepiness Eyes: No visual changes, double vision, eye pain Ear, nose and throat: No hearing loss, ear pain, nasal congestion, sore throat Cardiovascular: No chest pain, palpitations Respiratory: No shortness of breath at rest or with exertion.   No wheezes.  Has OSA GastrointestinaI: No nausea, vomiting, diarrhea, abdominal pain, fecal incontinence Genitourinary: Notes urgency and rare incontiennce Musculoskeletal: as above Integumentary: No rash, pruritus, skin lesions Neurological: as above Psychiatric: Notes depression and some anxiety Endocrine: No palpitations, diaphoresis, change in appetite, change in weigh or increased thirst Hematologic/Lymphatic: No anemia, purpura, petechiae. Allergic/Immunologic: No itchy/runny eyes, nasal congestion, recent allergic reactions, rashes  ALLERGIES: Allergies  Allergen Reactions  . Atorvastatin   . Codeine Nausea And Vomiting  . Quinapril Hcl     HOME MEDICATIONS:  Current Outpatient Prescriptions:  .  ACCU-CHEK FASTCLIX LANCETS MISC, , Disp: , Rfl:  .  buPROPion (WELLBUTRIN SR) 150 MG 12 hr tablet, Take 150 mg by mouth daily., Disp: , Rfl:  .  BYDUREON 2 MG PEN, , Disp: , Rfl: 1 .  calcium carbonate (OS-CAL - DOSED IN MG OF ELEMENTAL CALCIUM) 1250 (500 CA) MG tablet, Take by mouth., Disp: , Rfl:  .  Calcium Carbonate-Vitamin D (CALCIUM + D PO), Take 1 tablet by mouth daily., Disp: , Rfl:  .  cetirizine (ZYRTEC) 10 MG tablet, Take 10 mg by mouth., Disp: , Rfl:  .  DEXILANT 60 MG capsule, , Disp: , Rfl:  .  empagliflozin (JARDIANCE) 10 MG TABS tablet, Take 10 mg by mouth., Disp: , Rfl:  .  furosemide (LASIX) 40 MG tablet, Take 40 mg by mouth daily., Disp: , Rfl:  .  HORIZANT 600 MG TBCR, Take 600 mg by mouth 2 (two) times daily., Disp: 180 tablet, Rfl: 3 .  imipramine (TOFRANIL) 25 MG tablet, Take one or two po at bedtime, Disp: 180 tablet, Rfl: 4 .  insulin regular (NOVOLIN R,HUMULIN R) 100 units/mL injection, Inject into the  skin 3 (three) times daily before meals. Has insulin pump, Disp: , Rfl:  .  montelukast (SINGULAIR) 10 MG tablet, Take 10 mg by mouth at bedtime., Disp: , Rfl:  .  thyroid (ARMOUR) 240 MG tablet, Take by mouth., Disp: , Rfl:  .  lidocaine (XYLOCAINE) 5 % ointment, Apply 1 application topically as needed., Disp: 35.44 g, Rfl:  11 .  losartan (COZAAR) 50 MG tablet, Take 50 mg by mouth., Disp: , Rfl:  .  omeprazole (PRILOSEC) 40 MG capsule, Take 40 mg by mouth., Disp: , Rfl:  .  phentermine 37.5 MG capsule, Take 1 capsule (37.5 mg total) by mouth daily. (Patient not taking: Reported on 01/15/2017), Disp: 30 capsule, Rfl: 5  PAST MEDICAL HISTORY: Past Medical History:  Diagnosis Date  . Anxiety   . Arthritis   . 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 Diabetes mellitus    insulin pump.followed dr Peyton Bottoms [facility]  . Difficult intubation    2011 scratched trachea  . Fibromyalgia   . GERD (gastroesophageal reflux disease)   . Headache(784.0)   . Hypothyroidism   . Lumbar disc disease   . Neuropathy   . Sjogren's disease ([place])   . Sleep apnea    no cpap.sleep study 2006 [facility]    PAST SURGICAL HISTORY: Past Surgical History:  Procedure Laterality Date  . APPENDECTOMY    . BACK SURGERY     lumbar fusion  . BRAIN SURGERY    . CESAREAN SECTION    . ENDOMETRIAL ABLATION    . FRACTURE SURGERY     l arm  . LUMBAR LAMINECTOMY/DECOMPRESSION MICRODISCECTOMY  11/13/2011   Procedure: LUMBAR LAMINECTOMY/DECOMPRESSION MICRODISCECTOMY;  Surgeon: Faythe Ghee, MD;  Location: [facility];  Service: Neurosurgery;  Laterality: Bilateral;  Bilateral Lumbar four-five Decompression  . TONSILLECTOMY    . TRANSPHENOIDAL / TRANSNASAL HYPOPHYSECTOMY / RESECTION PITUITARY TUMOR    . TRIGGER FINGER RELEASE    . TUBAL LIGATION      FAMILY HISTORY: Family History  Problem Relation Age of Onset  . Diabetes type II Mother   . Diabetes type II Father   . Heart  disease Father     SOCIAL HISTORY:  Social History   Social History  . Marital status: Married    Spouse name: N/A  . Number of children: N/A  . Years of education: N/A   Occupational History  . Not on file.   Social History Main Topics  . Smoking status: Former Smoker    Packs/day: 1.00    Years: 30.00  . Smokeless tobacco: Never Used  . Alcohol use 0.6 oz/week    1 Glasses of wine per week  . Drug use: No  . Sexual activity: Not on file   Other Topics Concern  . Not on file   Social History Narrative  . No narrative on file     PHYSICAL EXAM  Vitals:   01/15/17 1629  BP: (!) 151/75  Pulse: 89  Resp: 18  Weight: 243 lb (110.2 kg)  Height: 5' 3"  (1.6 m)    Body mass index is 43.05 kg/m.   General: The patient is an obese woman in NAD.   Pharynx is Mallampati 3  Skin: Extremities are without significant edema.  Musculoskeletal:  She has tenderness over many of the classic fibromyalgia tender points of the upper chest and back.     Some enlargement of PIP finger joints (esp right #2)  Neurologic Exam  Mental status: The patient is alert and oriented x 3 at the time of the examination. The patient has apparent normal recent and remote memory, with an apparently normal attention span and concentration ability.   Speech is normal.  Cranial nerves: Extraocular movements are full.   There is good facial sensation to soft touch bilaterally.Facial strength is normal.  Trapezius  and sternocleidomastoid strength is normal. No dysarthria is noted.  The tongue is midline, and the patient has symmetric elevation of the soft palate. No obvious hearing deficits are noted.  Motor:  Muscle bulk is normal.   Tone is normal. Strength is  5 / 5 in all 4 extremities.   Sensory: Sensory testing is intact to soft touch and vibration sensation in the arms. She has mildly reduced vibration sensation in the toes and mildly reduced touch sensation at the ankles.  Coordination:  Cerebellar testing reveals good finger-nose-finger and heel-to-shin bilaterally.  Gait and station: Station is normal.   Gait is normal. Tandem gait is slightly wide. Romberg is negative.   Reflexes: Deep tendon reflexes are symmetric and 1+ bilaterally.        DIAGNOSTIC DATA (LABS, IMAGING, TESTING) - I reviewed patient records, labs, notes, testing and imaging myself where available.  Lab Results  Component Value Date   WBC 11.8 (H) 11/08/2011   HGB 13.2 11/08/2011   HCT 40.0 11/08/2011   MCV 82.6 11/08/2011   PLT 308 11/08/2011      Component Value Date/Time   NA 137 11/08/2011 1522   K 3.8 11/08/2011 1522   CL 98 11/08/2011 1522   CO2 27 11/08/2011 1522   GLUCOSE 162 (H) 11/08/2011 1522   BUN 13 11/08/2011 1522   CREATININE 0.59 11/08/2011 1522   CALCIUM 9.9 11/08/2011 1522   PROT 8.0 11/08/2011 1522   ALBUMIN 3.7 11/08/2011 1522   AST 33 11/08/2011 1522   ALT 25 11/08/2011 1522   ALKPHOS 87 11/08/2011 1522   BILITOT 0.2 (L) 11/08/2011 1522   GFRNONAA >90 11/08/2011 1522   GFRAA >90 11/08/2011 1522      ASSESSMENT AND PLAN  Neuropathy  Obstructive apnea - Plan: Split night study  Monoclonal paraproteinemia  Fibromyalgia  Type 2 diabetes mellitus with diabetic polyneuropathy, with long-term current use of insulin ([place])   1.   Continue horizon for polyneuropathy restless leg pain. Change the lidocaine 2% jelly to 5% ointment.  2.    Continue imipramine for insomnia and nocturia 3.   Split-night PSG study for her obstructive sleep apnea and EDS 4.   She is advised to stay active and exercises as tolerated.  5.   RTC 4 months, sooner if she has new or worsening neurologic symptoms.   Acadia Thammavong A. Felecia Shelling, MD, PhD 04/17/3201, 3:34 PM Certified in Neurology, Clinical Neurophysiology, Sleep Medicine, Pain Medicine and Neuroimaging  [facility] [address] [place], [place] [address] [phone]  7

## 2017-03-11 DIAGNOSIS — Z87448 Personal history of other diseases of urinary system: Secondary | ICD-10-CM | POA: Insufficient documentation

## 2017-03-11 DIAGNOSIS — D472 Monoclonal gammopathy: Secondary | ICD-10-CM | POA: Insufficient documentation

## 2017-03-11 NOTE — Progress Notes (Signed)
Office Visit Note  Patient: Gloria Porter             Date of Birth: 10-Jul-1956           MRN: [id number]             PCP: Florestine Avers (Inactive) Referring: Loraine Leriche.,* Visit Date: 03/14/2017 Occupation: @GUAROCC @    Subjective:  New Patient (Initial Visit) (1 year 1 mont neck pain with stiffiness. Bilateral wrist pain, swelling and stiffness. Bilateral hand weakness. Bilateral ankle pain, stiffness, and swelling. Has tried injection and did not work.  States has to advoid prednisone. )   History of Present Illness: Gloria Porter is a 61 y.o. female seen in consultation per request of her PCP. According to patient in 2010 she started having increased fatigue. At the time she was given some medications by her PCP which did not help. In 2011 she developed increased lower back pain and had surgery for this disease of lumbar spine and lumbar laminectomy. She started experiencing increased weight gain and fatigue after that. She was also diagnosed with fibromyalgia syndrome. In 2012 she developed pneumonia but after resolution of pneumonia she had persistent elevation of WBC count. She was referred to infectious disease who diagnosed her with Sjogren's and referred her to Dr. Leonie Green who was started on pilocarpine for sicca symptoms. She was also referred to hematologist who diagnosed her with Cushing's disease and was referred to endocrinologist. In September 2012 she underwent pituitary tumor resection and was started on hydrocortisone. She states she is doing quite well as regards to Cushing's disease. In July 2017 she started having neck stiffness and swelling in her right wrist and right finger. She states she could not wear her rings. She could not see her PCP. Later she was given prednisone Dosepak by PA which helped only for 1 day. She went to see Dr. Berenice Primas and underwent left carpal tunnel release in fall of 2017. She also had cortisone injection to her right wrist  joint and right thumb. She had MRI of her right wrist joint she is uncertain about the findings. She was referred to Dr. Grandville Silos will give her left cortisone injection but it did not help. She went back to see her rheumatologist and there was nothing to offer. She states her symptoms got worse and she was having difficulty walking and pain in her hands decreased grip strength and pain in her ankle joints. In April 2018 she went to the urgent care and had no help. She went to the emergency room that night and was diagnosed with urinary tract infection she was given antibiotics Sr. which shot and then medication which helped only for 1 day. She was recently seen by her PCP and had some extensive labs and was referred here for second opinion.she's been prescribed Nucynta by her pain management physician.  Activities of Daily Living:  Patient reports morning stiffness for all day hours.   Patient Reports nocturnal pain.  Difficulty dressing/grooming: Denies Difficulty climbing stairs: Reports Difficulty getting out of chair: Reports Difficulty using hands for taps, buttons, cutlery, and/or writing: Reports   Review of Systems  Constitutional: Positive for fatigue and weight gain. Negative for night sweats, weight loss and weakness.  HENT: Positive for mouth dryness. Negative for mouth sores, trouble swallowing, trouble swallowing and nose dryness.   Eyes: Positive for dryness. Negative for pain, redness and visual disturbance.  Respiratory: Positive for shortness of breath. Negative for cough and difficulty breathing.  Cardiovascular: Negative for chest pain, palpitations, hypertension, irregular heartbeat and swelling in legs/feet.  Gastrointestinal: Negative for blood in stool, constipation and diarrhea.  Endocrine: Negative for increased urination.  Genitourinary: Negative for vaginal dryness.  Musculoskeletal: Positive for arthralgias, joint pain, joint swelling, myalgias, morning stiffness and  myalgias. Negative for muscle weakness and muscle tenderness.  Skin: Negative for color change, rash, hair loss, skin tightness, ulcers and sensitivity to sunlight.  Allergic/Immunologic: Negative for susceptible to infections.  Neurological: Negative for dizziness, memory loss and night sweats.  Hematological: Negative for swollen glands.  Psychiatric/Behavioral: Positive for sleep disturbance. Negative for depressed mood. The patient is nervous/anxious.     PMFS History:  Patient Active Problem List   Diagnosis Date Noted  . S/P trigger finger release bilateral third 03/14/2017  . MGUS (monoclonal gammopathy of unknown significance) 03/11/2017  . History of chronic kidney disease 03/11/2017  . Monoclonal paraproteinemia 01/11/2016  . Restless leg 01/11/2015  . Carpal tunnel syndrome 12/28/2014  . Dysphagia, oropharyngeal 12/28/2014  . Anxiety, generalized 10/14/2014  . Generalized joint pain 08/17/2014  . Encounter for therapeutic drug monitoring 07/13/2014  . Failed back syndrome of lumbar spine 07/13/2014  . Encounter for therapeutic drug level monitoring 07/13/2014  . 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 Diabetes ([place]) 06/16/2014  . Fibrositis 06/16/2014  . Cannot sleep 06/16/2014  . Extreme obesity 06/16/2014  . Neuropathy 06/16/2014  . Allergic rhinitis, seasonal 06/16/2014  . Post menopausal syndrome 06/16/2014  . 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 Spinal stenosis 06/16/2014  . Adult hypothyroidism 06/16/2014  . 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 Diabetes mellitus ([place]) 06/16/2014  . Fibromyalgia 06/16/2014  . Morbid obesity ([place]) 06/16/2014  . Sjogren's syndrome ([place]) 06/16/2014  . Breath shortness 01/21/2014  . Essential (primary) hypertension 01/21/2014  . Acid reflux 01/21/2014  . Adiposity 01/21/2014  . Clinical depression 08/26/2013  . Obstructive apnea 01/17/2013  . Cushing's syndrome ([place]) 05/23/2011  . Pituitary adenoma  ([place]) 05/23/2011    Past Medical History:  Diagnosis Date  . Anxiety   . Arthritis   . Cushing's disease ([place])    pituitary tumor removed 2012  . Diabetes mellitus    insulin pump.followed dr Peyton Bottoms [facility]  . Difficult intubation    2011 scratched trachea  . Fibromyalgia   . GERD (gastroesophageal reflux disease)   . Headache(784.0)   . Hypothyroidism   . Lumbar disc disease   . Neuropathy   . Sjogren's disease ([place])   . Sleep apnea    no cpap.sleep study 2006 [place]    Family History  Problem Relation Age of Onset  . Diabetes type II Mother   . Diabetes type II Father   . Heart disease Father   . Heart attack Father   . Colon cancer Maternal Grandmother   . Heart disease Maternal Grandfather    Past Surgical History:  Procedure Laterality Date  . APPENDECTOMY    . BACK SURGERY     lumbar fusion  . BRAIN SURGERY    . CESAREAN SECTION    . ENDOMETRIAL ABLATION    . FRACTURE SURGERY     l arm  . LUMBAR LAMINECTOMY/DECOMPRESSION MICRODISCECTOMY  11/13/2011   Procedure: LUMBAR LAMINECTOMY/DECOMPRESSION MICRODISCECTOMY;  Surgeon: Faythe Ghee, MD;  Location: [facility];  Service: Neurosurgery;  Laterality: Bilateral;  Bilateral Lumbar four-five Decompression  . TONSILLECTOMY    . TRANSPHENOIDAL / TRANSNASAL HYPOPHYSECTOMY / RESECTION PITUITARY TUMOR    . TRIGGER FINGER RELEASE    .  TUBAL LIGATION     Social History   Social History Narrative  . No narrative on file     Objective: Vital Signs: BP (!) 148/58 (BP Location: Left Arm, Patient Position: Sitting, Cuff Size: Normal)   Pulse 86   Ht 5\' 3"  (1.6 m)   Wt 252 lb (114.3 kg)   LMP 11/08/2006   BMI 44.64 kg/m    Physical Exam  Constitutional: She is oriented to person, place, and time. She appears well-developed and well-nourished.  HENT:  Head: Normocephalic and atraumatic.  Eyes: Conjunctivae and EOM are normal.  Neck: Normal range of motion.  Cardiovascular: Normal rate,  regular rhythm, normal heart sounds and intact distal pulses.   Pulmonary/Chest: Effort normal and breath sounds normal.  Abdominal: Soft. Bowel sounds are normal.  Lymphadenopathy:    She has no cervical adenopathy.  Neurological: She is alert and oriented to person, place, and time.  Skin: Skin is warm and dry. Capillary refill takes less than 2 seconds.  Skin tightness  Psychiatric: She has a normal mood and affect. Her behavior is normal.  Nursing note and vitals reviewed.    Musculoskeletal Exam: C-spine good range of motion. Lumbar spine limited range of motion. Thoracic kyphosis. Shoulder joints good range of motion. Her right elbow joint has a contracture due to prior surgery. She has good range of motion of her wrist joints she has incomplete fist formation due to tightness in her skin. Postsurgical changes noted over bilateral third finger due to trigger finger release. Dupuytren's contractures noted.  No synovitis noted. Hip joints knee joints ankles MTPs PIPs DIPs with good range of motion. No synovitis was noted on examination today. There was no evidence of Raynaud's phenomenon but no nailbed capillary changes were noted.  CDAI Exam: No CDAI exam completed.    Investigation: Findings:  01/03/2017 positive ANA speckled pattern 1:160, Hep C Ab nonreactive, Sed Rate 12, CRP high 35.2, RF and CCP negative, dsDNA negative     Imaging: Xr Ankle Complete Left  Result Date: 03/14/2017 No joint space narrowing was noted. No tibiotalar or subtalar narrowing was noted. Small inferior and posterior calcaneal spurs were noted.  Xr Ankle Complete Right  Result Date: 03/14/2017 No joint space narrowing was noted. No tibiotalar or subtalar narrowing was noted. Small inferior and posterior calcaneal spurs were noted.  Xr Hand 2 View Left  Result Date: 03/14/2017 All PIP/DIP narrowing. No MCP joint narrowing or intercarpal joint narrowing was noted. Impression: Findings are consistent with  osteoarthritis of the hand.  Xr Hand 2 View Right  Result Date: 03/14/2017 All PIP/DIP narrowing. No MCP joint narrowing or intercarpal joint narrowing was noted. Impression: Findings are consistent with osteoarthritis of the hand.  Xr Knee 3 View Left  Result Date: 03/14/2017 Moderate medial compartment. No chondrocalcinosis. Mild patellofemoral narrowing. Impression these findings are consistent with moderate osteoarthritis and mild chondromalacia patella   Speciality Comments: No specialty comments available.    Procedures:  No procedures performed Allergies: Atorvastatin; Codeine; and Quinapril hcl   Assessment / Plan:     Visit Diagnoses: Generalized joint pain: She complains of multiple arthralgias and intermittent joint swelling. She also gives history of response to prednisone lasting for only 1 day.  Pain in both hands. No synovitis on examination was noted. She does have incomplete fist formation and decreased grip strength due to tightness of her skin. There was no evidence of Raynaud's phenomenon or nail bed capillary changes. I believe some of these skin  changes are due to chronic diabetes . - Plan: XR Hand 2 View Right, XR Hand 2 View Left, x-ray showed only osteoarthritic changes. 14-3-3 eta Protein, C3 and C4, [id number] ENA PANEL, Uric acid. I will schedule ultrasound of bilateral hands to rule out synovitis.  Chronic pain of both knees . She complains of increased left knee joint pain.- Plan: XR KNEE 3 VIEW LEFT. X-rays revealed moderate osteoarthritis and mild chondromalacia patella.  Pain in both feet and ankles: Patient request x-ray of her bilateral ankles due to ongoing pain.[place] of bilateral ankle joints were unremarkable.  Sjogren's syndrome with keratoconjunctivitis sicca (HCC)  ANA positive: She has low titer positive ANA at 1:160 speckled pattern  Other fatigue - Plan: CBC with Differential/Platelet, COMPLETE METABOLIC PANEL WITH GFR, Urinalysis, Routine w  reflex microscopic, VITAMIN D 25 Hydroxy (Vit-D Deficiency, Fractures), CK  Primary insomnia  DDD (degenerative disc disease), lumbar/ post laminectomy syndrome : Chronic pain  History of fibromyalgia: She's history of fibromyalgia for multiple years which causes generalized pain and discomfort  History of chronic pain: She is going to pain clinic now and started  Nucynta.  History of hypothyroidism: Followed by endocrinologist  History of Cushing disease: Followed up by her endocrinologist  History of depression and anxiety   History of diabetes mellitus: Her hemoglobin A1c is a still high but better per patient  Morbid obesity ([place])  History of chronic kidney disease  MGUS (monoclonal gammopathy of unknown significance) : Followed up by hematology   Orders: Orders Placed This Encounter  Procedures  . XR Hand 2 View Right  . XR Hand 2 View Left  . XR KNEE 3 VIEW LEFT  . XR Ankle Complete Left  . XR Ankle Complete Right  . CBC with Differential/Platelet  . COMPLETE METABOLIC PANEL WITH GFR  . Urinalysis, Routine w reflex microscopic  . 14-3-3 eta Protein  . C3 and C4  . [id number] ENA PANEL  . Uric acid  . VITAMIN D 25 Hydroxy (Vit-D Deficiency, Fractures)  . CK   No orders of the defined types were placed in this encounter.   Face-to-face time spent with patient was 60 minutes. 50% of time was spent in counseling and coordination of care.  Follow-Up Instructions: Return for polyarthralgia, FMS.   Bo Merino, MD  Note - This record has been created using Editor, commissioning.  Chart creation errors have been sought, but may not always  have been located. Such creation errors do not reflect on  the standard of medical care.

## 2017-03-14 ENCOUNTER — Ambulatory Visit (INDEPENDENT_AMBULATORY_CARE_PROVIDER_SITE_OTHER)

## 2017-03-14 ENCOUNTER — Encounter: Payer: Self-pay | Admitting: Rheumatology

## 2017-03-14 ENCOUNTER — Ambulatory Visit (INDEPENDENT_AMBULATORY_CARE_PROVIDER_SITE_OTHER): Payer: Self-pay

## 2017-03-14 ENCOUNTER — Ambulatory Visit (INDEPENDENT_AMBULATORY_CARE_PROVIDER_SITE_OTHER): Admitting: Rheumatology

## 2017-03-14 VITALS — BP 148/58 | HR 86 | Ht 63.0 in | Wt 252.0 lb

## 2017-03-14 DIAGNOSIS — M25571 Pain in right ankle and joints of right foot: Secondary | ICD-10-CM | POA: Diagnosis not present

## 2017-03-14 DIAGNOSIS — Z87898 Personal history of other specified conditions: Secondary | ICD-10-CM

## 2017-03-14 DIAGNOSIS — M79641 Pain in right hand: Secondary | ICD-10-CM

## 2017-03-14 DIAGNOSIS — M79642 Pain in left hand: Secondary | ICD-10-CM

## 2017-03-14 DIAGNOSIS — M25562 Pain in left knee: Secondary | ICD-10-CM

## 2017-03-14 DIAGNOSIS — R5383 Other fatigue: Secondary | ICD-10-CM

## 2017-03-14 DIAGNOSIS — F5101 Primary insomnia: Secondary | ICD-10-CM | POA: Diagnosis not present

## 2017-03-14 DIAGNOSIS — M3501 Sicca syndrome with keratoconjunctivitis: Secondary | ICD-10-CM | POA: Diagnosis not present

## 2017-03-14 DIAGNOSIS — M5136 Other intervertebral disc degeneration, lumbar region: Secondary | ICD-10-CM

## 2017-03-14 DIAGNOSIS — M25572 Pain in left ankle and joints of left foot: Secondary | ICD-10-CM

## 2017-03-14 DIAGNOSIS — M79671 Pain in right foot: Secondary | ICD-10-CM | POA: Diagnosis not present

## 2017-03-14 DIAGNOSIS — M25561 Pain in right knee: Secondary | ICD-10-CM | POA: Diagnosis not present

## 2017-03-14 DIAGNOSIS — R768 Other specified abnormal immunological findings in serum: Secondary | ICD-10-CM | POA: Diagnosis not present

## 2017-03-14 DIAGNOSIS — Z8739 Personal history of other diseases of the musculoskeletal system and connective tissue: Secondary | ICD-10-CM | POA: Diagnosis not present

## 2017-03-14 DIAGNOSIS — M2550 Pain in unspecified joint: Secondary | ICD-10-CM

## 2017-03-14 DIAGNOSIS — G8929 Other chronic pain: Secondary | ICD-10-CM

## 2017-03-14 DIAGNOSIS — Z87448 Personal history of other diseases of urinary system: Secondary | ICD-10-CM

## 2017-03-14 DIAGNOSIS — Z8639 Personal history of other endocrine, nutritional and metabolic disease: Secondary | ICD-10-CM

## 2017-03-14 DIAGNOSIS — M79672 Pain in left foot: Secondary | ICD-10-CM

## 2017-03-14 DIAGNOSIS — D472 Monoclonal gammopathy: Secondary | ICD-10-CM

## 2017-03-14 DIAGNOSIS — Z9889 Other specified postprocedural states: Secondary | ICD-10-CM | POA: Diagnosis not present

## 2017-03-14 DIAGNOSIS — Z8659 Personal history of other mental and behavioral disorders: Secondary | ICD-10-CM

## 2017-03-14 LAB — CBC WITH DIFFERENTIAL/PLATELET
BASOS ABS: 0 {cells}/uL (ref 0–200)
BASOS PCT: 0 %
EOS PCT: 2 %
Eosinophils Absolute: 232 cells/uL (ref 15–500)
HCT: 39 % (ref 35.0–45.0)
Hemoglobin: 12.4 g/dL (ref 11.7–15.5)
Lymphocytes Relative: 22 %
Lymphs Abs: 2552 cells/uL (ref 850–3900)
MCH: 26.7 pg — AB (ref 27.0–33.0)
MCHC: 31.8 g/dL — ABNORMAL LOW (ref 32.0–36.0)
MCV: 83.9 fL (ref 80.0–100.0)
MONOS PCT: 8 %
MPV: 9.9 fL (ref 7.5–12.5)
Monocytes Absolute: 928 cells/uL (ref 200–950)
NEUTROS ABS: 7888 {cells}/uL — AB (ref 1500–7800)
Neutrophils Relative %: 68 %
PLATELETS: 270 10*3/uL (ref 140–400)
RBC: 4.65 MIL/uL (ref 3.80–5.10)
RDW: 16.4 % — AB (ref 11.0–15.0)
WBC: 11.6 10*3/uL — ABNORMAL HIGH (ref 3.8–10.8)

## 2017-03-14 LAB — COMPLETE METABOLIC PANEL WITH GFR
ALT: 15 U/L (ref 6–29)
AST: 19 U/L (ref 10–35)
Albumin: 3.5 g/dL — ABNORMAL LOW (ref 3.6–5.1)
Alkaline Phosphatase: 82 U/L (ref 33–130)
BILIRUBIN TOTAL: 0.3 mg/dL (ref 0.2–1.2)
BUN: 13 mg/dL (ref 7–25)
CHLORIDE: 101 mmol/L (ref 98–110)
CO2: 22 mmol/L (ref 20–31)
Calcium: 8.6 mg/dL (ref 8.6–10.4)
Creat: 0.62 mg/dL (ref 0.50–0.99)
GFR, Est African American: >89 mL/min (ref >=60)
Glucose, Bld: 165 mg/dL — ABNORMAL HIGH (ref 65–99)
Potassium: 4.5 mmol/L (ref 3.5–5.3)
Sodium: 138 mmol/L (ref 135–146)
TOTAL PROTEIN: 7.2 g/dL (ref 6.1–8.1)

## 2017-03-15 LAB — C3 AND C4
C3 COMPLEMENT: 163 mg/dL (ref 83–193)
C4 COMPLEMENT: 21 mg/dL (ref 15–57)

## 2017-03-15 LAB — VITAMIN D 25 HYDROXY (VIT D DEFICIENCY, FRACTURES): VIT D 25 HYDROXY: 39 ng/mL (ref 30–100)

## 2017-03-15 LAB — URINALYSIS, MICROSCOPIC ONLY
CASTS: NONE SEEN [LPF]
CRYSTALS: NONE SEEN [HPF]
RBC / HPF: NONE SEEN RBC/HPF (ref <=2)

## 2017-03-15 LAB — URINALYSIS, ROUTINE W REFLEX MICROSCOPIC
BILIRUBIN URINE: NEGATIVE
Hgb urine dipstick: NEGATIVE
Ketones, ur: NEGATIVE
LEUKOCYTES UA: NEGATIVE
NITRITE: POSITIVE — AB
PROTEIN: NEGATIVE
SPECIFIC GRAVITY, URINE: 1.033 (ref 1.001–1.035)
pH: 6 (ref 5.0–8.0)

## 2017-03-15 LAB — CP5000020 ENA PANEL
ENA SM Ab Ser-aCnc: <1
Ribonucleic Protein(ENA) Antibody, IgG: 1.8 — ABNORMAL HIGH
SSA (Ro) (ENA) Antibody, IgG: <1
SSB (LA) (ENA) ANTIBODY, IGG: NEGATIVE
Scleroderma (Scl-70) (ENA) Antibody, IgG: <1

## 2017-03-15 LAB — CK: Total CK: 36 U/L (ref 29–143)

## 2017-03-15 LAB — URIC ACID: Uric Acid, Serum: 4.4 mg/dL (ref 2.5–7.0)

## 2017-03-19 LAB — 14-3-3 ETA PROTEIN

## 2017-03-20 NOTE — Progress Notes (Signed)
Ask Pt to see PCP for poss UTI

## 2017-03-27 ENCOUNTER — Telehealth: Payer: Self-pay | Admitting: Rheumatology

## 2017-03-27 NOTE — Telephone Encounter (Signed)
Patient is requesting that her labs be sent to Dr. Roni Bread (endocrinologist) at [facility] at [facility]. She states she spoke with Seth Bake last week and and was told she had a UTI and needed to follow up with her doctor and he is now requesting the results so he can treat her for the UTI. Please advise.

## 2017-03-28 NOTE — Telephone Encounter (Signed)
Sent lab results.

## 2017-04-08 NOTE — Progress Notes (Signed)
Assessment / Plan:     Visit Diagnoses: Generalized joint pain: She complains of multiple arthralgias and intermittent joint swelling. She also gives history of response to prednisone lasting for only 1 day.  Pain in both hands. No synovitis on examination was noted. She does have incomplete fist formation and decreased grip strength due to tightness of her skin. There was no evidence of Raynaud's phenomenon or nail bed capillary changes. I believe some of these skin changes are due to chronic diabetes . - Plan: XR Hand 2 View Right, XR Hand 2 View Left, x-ray showed only osteoarthritic changes. 14-3-3 eta Protein, C3 and C4, [id number] ENA PANEL, Uric acid. I will schedule ultrasound of bilateral hands to rule out synovitis.  01/03/2017 ANA 1:160 NS 03/14/2017 RNP+

## 2017-04-10 ENCOUNTER — Ambulatory Visit (INDEPENDENT_AMBULATORY_CARE_PROVIDER_SITE_OTHER): Admitting: Rheumatology

## 2017-04-10 ENCOUNTER — Inpatient Hospital Stay (INDEPENDENT_AMBULATORY_CARE_PROVIDER_SITE_OTHER): Payer: Self-pay

## 2017-04-10 DIAGNOSIS — M351 Other overlap syndromes: Secondary | ICD-10-CM | POA: Insufficient documentation

## 2017-04-10 DIAGNOSIS — R768 Other specified abnormal immunological findings in serum: Secondary | ICD-10-CM | POA: Diagnosis not present

## 2017-04-10 DIAGNOSIS — M2550 Pain in unspecified joint: Secondary | ICD-10-CM

## 2017-04-10 DIAGNOSIS — M79641 Pain in right hand: Secondary | ICD-10-CM

## 2017-04-10 DIAGNOSIS — M79642 Pain in left hand: Secondary | ICD-10-CM

## 2017-04-10 DIAGNOSIS — M19041 Primary osteoarthritis, right hand: Secondary | ICD-10-CM | POA: Insufficient documentation

## 2017-04-10 DIAGNOSIS — M5136 Other intervertebral disc degeneration, lumbar region: Secondary | ICD-10-CM | POA: Insufficient documentation

## 2017-04-10 DIAGNOSIS — Z1159 Encounter for screening for other viral diseases: Secondary | ICD-10-CM

## 2017-04-10 DIAGNOSIS — Z111 Encounter for screening for respiratory tuberculosis: Secondary | ICD-10-CM

## 2017-04-10 DIAGNOSIS — M19042 Primary osteoarthritis, left hand: Secondary | ICD-10-CM

## 2017-04-10 DIAGNOSIS — M170 Bilateral primary osteoarthritis of knee: Secondary | ICD-10-CM | POA: Insufficient documentation

## 2017-04-10 NOTE — Progress Notes (Signed)
Office Visit Note  Patient: Gloria Porter             Date of Birth: 1955/10/22           MRN: [id number]             PCP: Florestine Avers (Inactive) Referring: No ref. provider found Visit Date: 04/16/2017 Occupation: @GUAROCC @    Subjective:  Pain in hands.   History of Present Illness: Gloria Porter is a 61 y.o. female with history of mixed connective tissue disease. She continues to have arthralgias. She had recent ultrasound which was positive for synovitis. She also continues to have sicca symptoms. We discussed the use of Plaquenil during the last visit and give a handout. She has reviewed the medication and is ready to start on the medication. She has some pain in her ankles as well.  Activities of Daily Living:  Patient reports morning stiffness for 15 minutes.   Patient Denies nocturnal pain.  Difficulty dressing/grooming: Denies Difficulty climbing stairs: Reports Difficulty getting out of chair: Reports Difficulty using hands for taps, buttons, cutlery, and/or writing: Reports   Review of Systems  Constitutional: Positive for fatigue. Negative for night sweats, weight gain, weight loss and weakness.  HENT: Positive for mouth dryness. Negative for mouth sores, trouble swallowing, trouble swallowing and nose dryness.   Eyes: Positive for dryness. Negative for pain, redness and visual disturbance.  Respiratory: Negative for cough, shortness of breath and difficulty breathing.   Cardiovascular: Negative for chest pain, palpitations, hypertension, irregular heartbeat and swelling in legs/feet.  Gastrointestinal: Negative for blood in stool, constipation and diarrhea.  Endocrine: Negative for increased urination.  Genitourinary: Negative for vaginal dryness.  Musculoskeletal: Positive for arthralgias, joint pain, myalgias, morning stiffness and myalgias. Negative for joint swelling, muscle weakness and muscle tenderness.  Skin: Negative for color change, rash,  hair loss, skin tightness, ulcers and sensitivity to sunlight.  Allergic/Immunologic: Negative for susceptible to infections.  Neurological: Negative for dizziness, memory loss and night sweats.  Hematological: Negative for swollen glands.  Psychiatric/Behavioral: Positive for depressed mood and sleep disturbance. The patient is nervous/anxious.     PMFS History:  Patient Active Problem List   Diagnosis Date Noted  . Primary osteoarthritis of both hands 04/10/2017  . Primary osteoarthritis of both knees 04/10/2017  . DDD (degenerative disc disease), lumbar 04/10/2017  . Mixed connective tissue disease ([place]) 04/10/2017  . S/P trigger finger release bilateral third 03/14/2017  . MGUS (monoclonal gammopathy of unknown significance) 03/11/2017  . History of chronic kidney disease 03/11/2017  . Monoclonal paraproteinemia 01/11/2016  . Restless leg 01/11/2015  . Carpal tunnel syndrome 12/28/2014  . Dysphagia, oropharyngeal 12/28/2014  . Anxiety, generalized 10/14/2014  . Generalized joint pain 08/17/2014  . Encounter for therapeutic drug monitoring 07/13/2014  . Failed back syndrome of lumbar spine 07/13/2014  . Encounter for therapeutic drug level monitoring 07/13/2014  . 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 Diabetes ([place]) 06/16/2014  . Fibrositis 06/16/2014  . Cannot sleep 06/16/2014  . Extreme obesity 06/16/2014  . Neuropathy 06/16/2014  . Allergic rhinitis, seasonal 06/16/2014  . Post menopausal syndrome 06/16/2014  . 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) 06/16/2014  . Spinal stenosis 06/16/2014  . Adult hypothyroidism 06/16/2014  . 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 Diabetes mellitus ([place]) 06/16/2014  . Fibromyalgia 06/16/2014  . Morbid obesity ([place]) 06/16/2014  . Sjogren's syndrome ([place]) 06/16/2014  . Breath shortness 01/21/2014  . Essential (primary) hypertension 01/21/2014  . Acid reflux 01/21/2014  .  Adiposity 01/21/2014  . Clinical depression 08/26/2013   . Obstructive apnea 01/17/2013  . Cushing's syndrome ([place]) 05/23/2011  . Pituitary adenoma ([place]) 05/23/2011    Past Medical History:  Diagnosis Date  . Anxiety   . Arthritis   . Cushing's disease ([place])    pituitary tumor removed 2012  . Diabetes mellitus    insulin pump.followed dr Peyton Bottoms [facility]  . Difficult intubation    2011 scratched trachea  . Fibromyalgia   . GERD (gastroesophageal reflux disease)   . Headache(784.0)   . Hypothyroidism   . Lumbar disc disease   . Neuropathy   . Sjogren's disease ([place])   . Sleep apnea    no cpap.sleep study 2006 [place]    Family History  Problem Relation Age of Onset  . Diabetes type II Mother   . Diabetes type II Father   . Heart disease Father   . Heart attack Father   . Colon cancer Maternal Grandmother   . Heart disease Maternal Grandfather    Past Surgical History:  Procedure Laterality Date  . APPENDECTOMY    . BACK SURGERY     lumbar fusion  . BRAIN SURGERY    . CESAREAN SECTION    . ENDOMETRIAL ABLATION    . FRACTURE SURGERY     l arm  . LUMBAR LAMINECTOMY/DECOMPRESSION MICRODISCECTOMY  11/13/2011   Procedure: LUMBAR LAMINECTOMY/DECOMPRESSION MICRODISCECTOMY;  Surgeon: Faythe Ghee, MD;  Location: [facility];  Service: Neurosurgery;  Laterality: Bilateral;  Bilateral Lumbar four-five Decompression  . TONSILLECTOMY    . TRANSPHENOIDAL / TRANSNASAL HYPOPHYSECTOMY / RESECTION PITUITARY TUMOR    . TRIGGER FINGER RELEASE    . TUBAL LIGATION     Social History   Social History Narrative  . No narrative on file     Objective: Vital Signs: BP 120/72   Pulse 76   Resp 16   Ht 5\' 3"  (1.6 m)   Wt 250 lb (113.4 kg)   LMP 11/08/2006   BMI 44.29 kg/m    Physical Exam  Constitutional: She is oriented to person, place, and time. She appears well-developed and well-nourished.  HENT:  Head: Normocephalic and atraumatic.  Eyes: Conjunctivae and EOM are normal.  Neck: Normal range of motion.    Cardiovascular: Normal rate, regular rhythm, normal heart sounds and intact distal pulses.   Pulmonary/Chest: Effort normal and breath sounds normal.  Abdominal: Soft. Bowel sounds are normal.  Lymphadenopathy:    She has no cervical adenopathy.  Neurological: She is alert and oriented to person, place, and time.  Skin: Skin is warm and dry. Capillary refill takes less than 2 seconds.  Psychiatric: She has a normal mood and affect. Her behavior is normal.  Nursing note and vitals reviewed.    Musculoskeletal Exam: C-spine and thoracic lumbar spine good range of motion. Shoulder joints elbow joints wrist joint MCPs PIPs DIPs with good range of motion. She is some tenderness across her MCPs and PIP joints. Hip joints knee joints ankles MTPs PIPs DIPs with good range of motion. She is some discomfort range of motion of her left knee joint without any warmth swelling or effusion.  CDAI Exam: No CDAI exam completed.    Investigation: Findings:  01/03/2017  ANA  1:160NS, ds DNA negative,  RF and CCP negative,Sed Rate 12, CRP high 35.2, Hep C negative 03/14/2017 CBC WBC 11.6, CMP glucose 165 UA 3+ glucose, positive bacteria, CK 36, vitamin D 39 Uric acid 4.4, 14 33 eta negative,  C3-C4 normal, ENA positive RNP (negative dsDNA, SCL 70, SSA, SSB, [place])   Imaging: [place] Extrem Up Bilat Comp  Result Date: 04/10/2017 Ultrasound examination of bilateral hands was performed per EULAR recommendations. Using 12 MHz transducer, grayscale and power Doppler bilateral second, third, and fifth MCP joints and bilateral wrist joints both dorsal and volar aspects were evaluated to look for synovitis or tenosynovitis. The findings were there was  synovitis in bilateral second third and fifth MCP joints with tenosynovitis and bilateral  on ultrasound examination. Right median nerve was  0.10 cm squares which was within normal limits  and left median nerve was 0.11  cm squares which was within normal . Impression:  Ultrasound examination synovitis in bilateral hands. It is consistent with inflammatory arthritis.    Speciality Comments: No specialty comments available.    Procedures:  No procedures performed Allergies: Atorvastatin; Codeine; and Quinapril hcl   Assessment / Plan:     Visit Diagnoses: Mixed connective tissue disease ([place]) - ANA 1:160 NS, RNP positive, inflammatory arthritis. Positive synovitis on ultrasound. We had detailed discussion regarding Plaquenil during the last visit. Indications side effects contraindications were discussed today. A handout was given during the last visit and she is reviewed informed consent was obtained. The plan is to start her on Plaquenil 200 mg by mouth twice a day as needed through Friday. She's been advised to get labs in a month then every 3 months to monitor for drug toxicity she will need baseline eye exam denies exam on a yearly basis to monitor for ocular toxicity. She's been advised to get pneumococcal vaccine. Based on a she should also get zoster vaccine.  Palpitations: S patient has history of palpitations I'll refer her to cardiology for evaluation. Mixed connective tissue disease can be associated with pulmonary hypertension.   Sjogren's syndrome with keratoconjunctivitis sicca ([place]): Over-the-counter products for sicca symptoms were discussed.  High risk medication use - Plan PLQ 200 mg by mouth twice a day Monday to Friday. Lab check in a month and then every 3 months.  Primary osteoarthritis of both hands: Joint protection and muscle strengthening discussed.  Primary osteoarthritis of both knees - moderate: Chronic pain  DDD (degenerative disc disease), lumbar - s/p laminectomy : Chronic pain  Other medical problems are as follows:  History of fibromyalgia: She's history of fibromyalgia for multiple years which causes generalized pain and discomfort  History of chronic pain: She is going to pain clinic now and started   Nucynta.  History of hypothyroidism: Followed by endocrinologist  History of Cushing disease: Followed up by her endocrinologist  History of depression and anxiety   History of diabetes mellitus: Her hemoglobin A1c is a still high but better per patient  Morbid obesity ([place])  History of chronic kidney disease  MGUS (monoclonal gammopathy of unknown significance) : Followed up by hematology  Orders: Orders Placed This Encounter  Procedures  . CBC with Differential/Platelet  . COMPLETE METABOLIC PANEL WITH GFR  . Ambulatory referral to Cardiology   Meds ordered this encounter  Medications  . hydroxychloroquine (PLAQUENIL) 200 MG tablet    Sig: Take 1 tablet by mouth BID Monday through Friday    Dispense:  40 tablet    Refill:  2    Face-to-face time spent with patient was 30 minutes. 50% of time was spent in counseling and coordination of care.  Follow-Up Instructions: Return in about 3 months (around 07/16/2017) for MCTD, Sjogren's, Osteoarthritis,DDD.   Bo Merino, MD  Note -  This record has been created using [employer].  Chart creation errors have been sought, but may not always  have been located. Such creation errors do not reflect on  the standard of medical care.

## 2017-04-10 NOTE — Patient Instructions (Signed)

## 2017-04-11 LAB — HEPATITIS PANEL, ACUTE

## 2017-04-12 LAB — QUANTIFERON TB GOLD ASSAY (BLOOD)
Interferon Gamma Release Assay: NEGATIVE
QUANTIFERON TB AG MINUS NIL: 0.23 [IU]/mL
Quantiferon Nil Value: 0.08 IU/mL

## 2017-04-12 LAB — RFX PTT-LA W/RFX TO HEX PHASE CONF: PTT-LA SCREEN: 46 s — AB (ref <=40)

## 2017-04-12 LAB — RFX DRVVT SCR W/RFLX CONF 1:1 MIX: dRVVT Screen: 47 s — ABNORMAL HIGH (ref <=45)

## 2017-04-12 LAB — IGG, IGA, IGM

## 2017-04-12 LAB — RFLX HEXAGONAL PHASE CONFIRM: HEXAGONAL PHASE CONFIRM: NEGATIVE

## 2017-04-12 LAB — RFLX DRVVT CONFRIM: Drvvt confirmation: NEGATIVE

## 2017-04-12 LAB — GLUCOSE 6 PHOSPHATE DEHYDROGENASE

## 2017-04-12 LAB — LUPUS ANTICOAGULANT PANEL

## 2017-04-16 ENCOUNTER — Encounter: Payer: Self-pay | Admitting: Rheumatology

## 2017-04-16 ENCOUNTER — Ambulatory Visit (INDEPENDENT_AMBULATORY_CARE_PROVIDER_SITE_OTHER): Admitting: Rheumatology

## 2017-04-16 VITALS — BP 120/72 | HR 76 | Resp 16 | Ht 63.0 in | Wt 250.0 lb

## 2017-04-16 DIAGNOSIS — Z79899 Other long term (current) drug therapy: Secondary | ICD-10-CM | POA: Diagnosis not present

## 2017-04-16 DIAGNOSIS — M170 Bilateral primary osteoarthritis of knee: Secondary | ICD-10-CM

## 2017-04-16 DIAGNOSIS — M5136 Other intervertebral disc degeneration, lumbar region: Secondary | ICD-10-CM | POA: Diagnosis not present

## 2017-04-16 DIAGNOSIS — M3501 Sicca syndrome with keratoconjunctivitis: Secondary | ICD-10-CM | POA: Diagnosis not present

## 2017-04-16 DIAGNOSIS — M351 Other overlap syndromes: Secondary | ICD-10-CM

## 2017-04-16 DIAGNOSIS — Z87898 Personal history of other specified conditions: Secondary | ICD-10-CM | POA: Diagnosis not present

## 2017-04-16 DIAGNOSIS — M19041 Primary osteoarthritis, right hand: Secondary | ICD-10-CM | POA: Diagnosis not present

## 2017-04-16 DIAGNOSIS — M19042 Primary osteoarthritis, left hand: Secondary | ICD-10-CM

## 2017-04-16 MED ORDER — HYDROXYCHLOROQUINE SULFATE 200 MG PO TABS: ORAL_TABLET | ORAL | 2 refills | Status: DC

## 2017-04-16 NOTE — Patient Instructions (Addendum)
Please contact your PCP in regards to obtaing the Pneumococcal Vaccine.  Standing Labs We placed an order today for your standing lab work.    Please come back and get your standing labs in 1 month and every 3 months.  We have open lab Monday through Friday from 8:30-11:30 AM and 1:30-4 PM at the office of Dr. Bo Merino.   The office is located at [address] [place], [place], [place] [address] No appointment is necessary.   Labs are drawn by [facility].  You may receive a bill from [facility] for your lab work. If you have any questions regarding directions or hours of operation,  please call [phone].

## 2017-04-17 LAB — GLUCOSE 6 PHOSPHATE DEHYDROGENASE: G-6PDH: 22.4 U/g{Hb} — AB (ref 7.0–20.5)

## 2017-04-17 LAB — PROTEIN ELECTROPHORESIS, SERUM, WITH REFLEX

## 2017-04-17 LAB — IGG, IGA, IGM
IGA: 312 mg/dL (ref 81–463)
IgG (Immunoglobin G), Serum: 1573 mg/dL (ref 694–1618)
IgM, Serum: 158 mg/dL (ref 48–271)

## 2017-04-17 LAB — HEPATITIS PANEL, ACUTE
HCV Ab: NONREACTIVE
Hep A IgM: NONREACTIVE
Hep B C IgM: NONREACTIVE
Hepatitis B Surface Ag: NONREACTIVE

## 2017-04-18 LAB — CARDIOLIPIN ANTIBODIES, IGG, IGM, IGA
Anticardiolipin IgA: <11 [APL'U]
Anticardiolipin IgG: <14 [GPL'U]
Anticardiolipin IgM: <12 [MPL'U]

## 2017-04-18 LAB — BETA-2 GLYCOPROTEIN ANTIBODIES: Beta-2-Glycoprotein I IgA: <9 SAU (ref <=20)

## 2017-04-19 LAB — PROTEIN ELECTROPHORESIS, SERUM, WITH REFLEX
Albumin ELP: 3.3 g/dL — ABNORMAL LOW (ref 3.8–4.8)
Alpha-1-Globulin: 0.4 g/dL — ABNORMAL HIGH (ref 0.2–0.3)
Alpha-2-Globulin: 0.9 g/dL (ref 0.5–0.9)
BETA 2: 0.4 g/dL (ref 0.2–0.5)
BETA GLOBULIN: 0.5 g/dL (ref 0.4–0.6)
Gamma Globulin: 1.6 g/dL (ref 0.8–1.7)
Total Protein, Serum Electrophoresis: 7.1 g/dL (ref 6.1–8.1)

## 2017-04-19 NOTE — Progress Notes (Signed)
WNLs

## 2017-04-22 ENCOUNTER — Telehealth: Payer: Self-pay | Admitting: Radiology

## 2017-04-22 NOTE — Telephone Encounter (Signed)
I spoke to patient to advise her labs are normal, she is asking what is next step to help her hand pain as she is having a lot of pain currently, she is using PLQ bid M-F

## 2017-04-22 NOTE — Telephone Encounter (Signed)
Pt. May take Tylenol for pain. It will take  approx. 2 months for PLQ to get into her system.

## 2017-04-22 NOTE — Telephone Encounter (Signed)
-----   Message from Bo Merino, MD sent at 04/19/2017  2:21 PM EDT ----- WNLs

## 2017-04-23 NOTE — Telephone Encounter (Signed)
She has OA hands as well. She may try Tylenol.

## 2017-04-23 NOTE — Telephone Encounter (Signed)
Patient states she wants to know what is causing the pain in her hands. Patient states she was advised that the blood work was done to out what may be causing it. Please advise.

## 2017-04-25 NOTE — Telephone Encounter (Signed)
Patient advised and verbalized understanding 

## 2017-05-02 ENCOUNTER — Encounter (INDEPENDENT_AMBULATORY_CARE_PROVIDER_SITE_OTHER): Payer: Self-pay

## 2017-05-02 ENCOUNTER — Encounter: Payer: Self-pay | Admitting: Interventional Cardiology

## 2017-05-02 ENCOUNTER — Ambulatory Visit (INDEPENDENT_AMBULATORY_CARE_PROVIDER_SITE_OTHER): Admitting: Interventional Cardiology

## 2017-05-02 VITALS — BP 132/54 | HR 80 | Ht 63.0 in | Wt 251.8 lb

## 2017-05-02 DIAGNOSIS — R0602 Shortness of breath: Secondary | ICD-10-CM

## 2017-05-02 DIAGNOSIS — E1142 Type 2 diabetes mellitus with diabetic polyneuropathy: Secondary | ICD-10-CM | POA: Diagnosis not present

## 2017-05-02 DIAGNOSIS — R002 Palpitations: Secondary | ICD-10-CM

## 2017-05-02 DIAGNOSIS — Z794 Long term (current) use of insulin: Secondary | ICD-10-CM

## 2017-05-02 NOTE — Progress Notes (Signed)
Cardiology Office Note   Date:  05/02/2017   ID:  Gloria Porter, DOB 04/26/1956, MRN [id number]  PCP:  Florestine Avers (Inactive)    No chief complaint on file. palpitations   Wt Readings from Last 3 Encounters:  05/02/17 251 lb 12.8 oz (114.2 kg)  04/16/17 250 lb (113.4 kg)  03/14/17 252 lb (114.3 kg)       History of Present Illness: Gloria Porter is a 61 y.o. female  Who has seen rheumatology for joint pain.  She was diagnosed with MCTD.  She has ben diagnosed with Sjogrens as well in the past.   Gloria Porter is a 61 y.o. female who is being seen today for the evaluation of palpitations at the request of Bo Merino, MD.   She has had some palpiations in the last 3 months, 8-10 episodes.  Episodes last 15 minutes at a time approximately.  She can feel her heart rate in her throat.  She measured it at 150 beats a minute.  One episode occurred at rest, while in bed.  She has been sitting at a table during one episode.  THe only trigger she can think of is a medicine called Nucenta, a pain medicine.   Denies : Chest pain. Dizziness. Leg edema. Nitroglycerin use. Orthopnea. Paroxysmal nocturnal dyspnea. Shortness of breath. Syncope.   She can have an occasional chest tightness.  It is not related to exertion.  She does not do any regular exercise.  Going to the grocery store is her most strenuous activity.  Former smoker, quit in 2007.She has some DOE.         Past Medical History:  Diagnosis Date  . Anxiety   . Arthritis   . Cushing's disease ([place])    pituitary tumor removed 2012  . Diabetes mellitus    insulin pump.followed dr Peyton Bottoms [facility]  . Difficult intubation    2011 scratched trachea  . Fibromyalgia   . GERD (gastroesophageal reflux disease)   . Headache(784.0)   . Hypothyroidism   . Lumbar disc disease   . Neuropathy   . Sjogren's disease ([place])   . Sleep apnea    no cpap.sleep study 2006 [place]    Past  Surgical History:  Procedure Laterality Date  . APPENDECTOMY    . BACK SURGERY     lumbar fusion  . BRAIN SURGERY    . CESAREAN SECTION    . ENDOMETRIAL ABLATION    . FRACTURE SURGERY     l arm  . LUMBAR LAMINECTOMY/DECOMPRESSION MICRODISCECTOMY  11/13/2011   Procedure: LUMBAR LAMINECTOMY/DECOMPRESSION MICRODISCECTOMY;  Surgeon: Faythe Ghee, MD;  Location: [facility];  Service: Neurosurgery;  Laterality: Bilateral;  Bilateral Lumbar four-five Decompression  . TONSILLECTOMY    . TRANSPHENOIDAL / TRANSNASAL HYPOPHYSECTOMY / RESECTION PITUITARY TUMOR    . TRIGGER FINGER RELEASE    . TUBAL LIGATION       Current Outpatient Prescriptions  Medication Sig Dispense Refill  . ACCU-CHEK FASTCLIX LANCETS MISC     . buPROPion (WELLBUTRIN SR) 150 MG 12 hr tablet Take 150 mg by mouth daily.    Marland Kitchen BYDUREON 2 MG PEN   1  . calcium carbonate (OS-CAL - DOSED IN MG OF ELEMENTAL CALCIUM) 1250 (500 CA) MG tablet Take by mouth.    . Calcium Carbonate-Vitamin D (CALCIUM + D PO) Take 1 tablet by mouth daily.    . cetirizine (ZYRTEC) 10 MG tablet Take 10 mg by mouth.    Marland Kitchen  DEXILANT 60 MG capsule     . empagliflozin (JARDIANCE) 10 MG TABS tablet Take 10 mg by mouth.    . furosemide (LASIX) 40 MG tablet Take 40 mg by mouth daily.    Marland Kitchen HORIZANT 600 MG TBCR Take 600 mg by mouth 2 (two) times daily. 180 tablet 3  . hydroxychloroquine (PLAQUENIL) 200 MG tablet Take 1 tablet by mouth BID Monday through Friday 40 tablet 2  . imipramine (TOFRANIL) 25 MG tablet Take one or two po at bedtime 180 tablet 4  . insulin regular (NOVOLIN R,HUMULIN R) 100 units/mL injection Inject into the skin 3 (three) times daily before meals. Has insulin pump    . Insulin Regular Human (HUMULIN R U-500, CONCENTRATED, Nina) Inject 20 Units into the skin 3 (three) times daily as needed.    . lidocaine (XYLOCAINE) 5 % ointment Apply 1 application topically as needed. 35.44 g 11  . montelukast (SINGULAIR) 10 MG tablet Take 10 mg by mouth  at bedtime.    Marland Kitchen thyroid (ARMOUR) 240 MG tablet Take by mouth.    . losartan (COZAAR) 50 MG tablet Take 50 mg by mouth.     No current facility-administered medications for this visit.     Allergies:   Atorvastatin; Codeine; and Quinapril hcl    Social History:  The patient  reports that she has quit smoking. She has a 30.00 pack-year smoking history. She has never used smokeless tobacco. She reports that she drinks about 0.6 oz of alcohol per week . She reports that she does not use drugs.   Family History:  The patient's family history includes Colon cancer in her maternal grandmother; Diabetes type II in her father and mother; Heart attack in her father; Heart disease in her father and maternal grandfather.    ROS:  Please see the history of present illness.   Otherwise, review of systems are positive for DOE.   All other systems are reviewed and negative.    PHYSICAL EXAM: VS:  BP (!) 132/54   Pulse 80   Ht 5\' 3"  (1.6 m)   Wt 251 lb 12.8 oz (114.2 kg)   LMP 11/08/2006   SpO2 94%   BMI 44.60 kg/m  , BMI Body mass index is 44.6 kg/m. GEN: Well nourished, well developed, in no acute distress  HEENT: normal  Neck: no JVD, carotid bruits, or masses Cardiac: RRR; no murmurs, rubs, or gallops,no edema  Respiratory:  clear to auscultation bilaterally, normal work of breathing GI: soft, nontender, nondistended, + BS MS: no deformity or atrophy  Skin: warm and dry, no rash Neuro:  Strength and sensation are intact Psych: euthymic mood, full affect   EKG:   The ekg ordered today demonstrates NSR, left axis deviation, NSST, PRWP   Recent Labs: 03/14/2017: ALT 15; BUN 13; Creat 0.62; Hemoglobin 12.4; Platelets 270; Potassium 4.5; Sodium 138   Lipid Panel No results found for: CHOL, TRIG, HDL, CHOLHDL, VLDL, LDLCALC, LDLDIRECT   Other studies Reviewed: Additional studies/ records that were reviewed today with results demonstrating: LDL 100, TG 175 in 2009, Cr 0.62 in  8/18.   ASSESSMENT AND PLAN:  1. Palpitations:  Symptoms and heart rate detected by the patient was suspicious for SVT.  Plan on event monitor. 2. Shortness of breath: Given mixed connective tissue disease, would like to exclude pulmonary hypertension or any other structural heart disease that may be contributing. Most likely, this is a large part due to deconditioning. She is not very active.  3. Diabetes: Continue aggressive diabetes management. Most recent A1c is not available. A1c 10.9 back in 2010.   Current medicines are reviewed at length with the patient today.  The patient concerns regarding her medicines were addressed.  The following changes have been made:  No change  Labs/ tests ordered today include:  No orders of the defined types were placed in this encounter.   Recommend 150 minutes/week of aerobic exercise Low fat, low carb, high fiber diet recommended  Disposition:   FU in based on monitor results.   Signed, Larae Grooms, MD  05/02/2017 2:30 PM    [address], [place], [place]  [address] Phone: [phone]; Fax: [phone]

## 2017-05-02 NOTE — Patient Instructions (Signed)
Medication Instructions:  Your physician recommends that you continue on your current medications as directed. Please refer to the Current Medication list given to you today.   Labwork: None ordered  Testing/Procedures: Your physician has requested that you have an echocardiogram. Echocardiography is a painless test that uses sound waves to create images of your heart. It provides your doctor with information about the size and shape of your heart and how well your heart's chambers and valves are working. This procedure takes approximately one hour. There are no restrictions for this procedure.  Your physician has recommended that you wear an event monitor. Event monitors are medical devices that record the heart's electrical activity. Doctors most often [place] these monitors to diagnose arrhythmias. Arrhythmias are problems with the speed or rhythm of the heartbeat. The monitor is a small, portable device. You can wear one while you do your normal daily activities. This is usually used to diagnose what is causing palpitations/syncope (passing out).    Follow-Up: Your physician recommends that you schedule a follow-up AS NEEDED ppointment    Any Other Special Instructions Will Be Listed Below (If Applicable).     If you need a refill on your cardiac medications before your next appointment, please call your pharmacy.

## 2017-05-15 ENCOUNTER — Ambulatory Visit (HOSPITAL_COMMUNITY): Attending: Cardiology

## 2017-05-15 ENCOUNTER — Other Ambulatory Visit: Payer: Self-pay

## 2017-05-15 ENCOUNTER — Ambulatory Visit (INDEPENDENT_AMBULATORY_CARE_PROVIDER_SITE_OTHER)

## 2017-05-15 DIAGNOSIS — R0602 Shortness of breath: Secondary | ICD-10-CM | POA: Insufficient documentation

## 2017-05-15 DIAGNOSIS — R002 Palpitations: Secondary | ICD-10-CM | POA: Diagnosis not present

## 2017-05-15 DIAGNOSIS — Z6841 Body Mass Index (BMI) 40.0 and over, adult: Secondary | ICD-10-CM | POA: Diagnosis not present

## 2017-05-15 DIAGNOSIS — G4730 Sleep apnea, unspecified: Secondary | ICD-10-CM | POA: Insufficient documentation

## 2017-05-15 DIAGNOSIS — F419 Anxiety disorder, unspecified: Secondary | ICD-10-CM | POA: Insufficient documentation

## 2017-05-15 DIAGNOSIS — E039 Hypothyroidism, unspecified: Secondary | ICD-10-CM | POA: Diagnosis not present

## 2017-05-15 DIAGNOSIS — E669 Obesity, unspecified: Secondary | ICD-10-CM | POA: Diagnosis not present

## 2017-05-15 DIAGNOSIS — M797 Fibromyalgia: Secondary | ICD-10-CM | POA: Insufficient documentation

## 2017-05-15 DIAGNOSIS — E1140 Type 2 diabetes mellitus with diabetic neuropathy, unspecified: Secondary | ICD-10-CM | POA: Insufficient documentation

## 2017-05-15 MED ORDER — PERFLUTREN LIPID MICROSPHERE
1.0000 mL | INTRAVENOUS | Status: AC | PRN
  Administered 2017-05-15: 2 mL via INTRAVENOUS

## 2017-05-22 ENCOUNTER — Telehealth: Payer: Self-pay | Admitting: *Deleted

## 2017-05-22 NOTE — Telephone Encounter (Signed)
Informed patient we had received notification from [employer]/ [employer], her monitor has not transmitted for the last 3 days and they are trying to contact her.  She spoke with them today.  Monitor was malfunctioning this weekend.  [employer] has shipped her a new monitor which was received today.  Asked patient when calling [employer] to baseline,  please make sure they extend her EOS date to accomodate the days her monitor was not working.  Patient understood and appreciated call.

## 2017-05-31 ENCOUNTER — Encounter: Payer: Self-pay | Admitting: Interventional Cardiology

## 2017-06-03 NOTE — Telephone Encounter (Signed)
Attempted to contact patient to see if she has tried getting more stickers from the company. Patient did not answer left message for patient to call back.

## 2017-06-05 NOTE — Telephone Encounter (Signed)
Patient calling back and states that she has had to try 2 different monitors because the first one was not working appropriately and that she has had several issues with the stickers and states that they caused a rash and kept falling off. Asked patient if she contacted the monitor company about trying different stickers and she states that she has already done that. Patient states that she has not had an episode since since July and denies having any palpitations, chest pain, SOB, or any other symptoms. Patient is requesting that she not wear the monitor anymore since she hasn't had any symptoms. Made patient aware that she could d/c the monitor and let us know if her Sx return. Patient verbalized understanding and thanked me for the call.

## 2017-07-01 ENCOUNTER — Other Ambulatory Visit: Payer: Self-pay | Admitting: Rheumatology

## 2017-07-02 NOTE — Telephone Encounter (Addendum)
Last Visit: 04/16/17 Next Visit: 08/01/17 Labs: 03/14/17 stable PLQ Eye Exam: 06/19/17 WNL  Okay to refill per Dr. Estanislado Pandy

## 2017-07-18 ENCOUNTER — Ambulatory Visit: Payer: PRIVATE HEALTH INSURANCE | Admitting: Neurology

## 2017-07-22 NOTE — Progress Notes (Signed)
Office Visit Note  Patient: Gloria Porter             Date of Birth: 01-20-1956           MRN: [id number]             PCP: Florestine Avers (Inactive) Referring: No ref. provider found Visit Date: 08/01/2017 Occupation: @GUAROCC @    Subjective:  Hand and wrist pain    History of Present Illness: Gloria Porter is a 61 y.o. female with a history of mixed connective tissue disease, Sjogren's, fibromyalgia, and osteoarthritis.  Patient states that she started PLQ after her visit on 04/16/17.  She states she has not noticed any improvement in joint swelling in her hands and wrists.  She denies any other joint swelling.  She has been taking Nucynta for pain relief and going to pain management.  She takes Tylenol pm to help with pain at night and to help her sleep.  She has been seeing a chiropractor for her neck pain and stiffness and has noticed improvement in ROM. She continues to go once a month.  She has constant mouth dryness and eye dryness.  She states her palpitations have resolved.  She followed up with cardiology and was cleared after wearing a halter monitor for 2 weeks.  She has not had any recent episodes.  She states her fibromyalgia has been doing ok.  She continues to have muscle tenderness on the medial aspects of her knees, calves, and thighs.  She states her left knee has been doing better since a fall in July.  She denies any knee swelling.      Activities of Daily Living:  Patient reports morning stiffness for 15  minutes.   Patient Reports nocturnal pain.  Difficulty dressing/grooming: Denies Difficulty climbing stairs: Reports Difficulty getting out of chair: Denies Difficulty using hands for taps, buttons, cutlery, and/or writing: Reports   Review of Systems  Constitutional: Positive for fatigue and weakness. Negative for fever and night sweats.  HENT: Positive for mouth dryness. Negative for mouth sores and nose dryness.   Eyes: Positive for dryness. Negative  for redness.  Respiratory: Negative for cough, shortness of breath and difficulty breathing.   Cardiovascular: Negative.  Negative for chest pain, palpitations, hypertension, irregular heartbeat and swelling in legs/feet.  Gastrointestinal: Negative.  Negative for blood in stool, constipation and diarrhea.  Endocrine: Negative.   Genitourinary: Positive for nocturia.  Musculoskeletal: Positive for arthralgias, joint pain, joint swelling, muscle weakness and morning stiffness. Negative for myalgias, muscle tenderness and myalgias.  Skin: Positive for color change. Negative for rash, hair loss, ulcers and sensitivity to sunlight.  Neurological: Positive for dizziness and numbness. Negative for headaches and night sweats.  Hematological: Positive for swollen glands.  Psychiatric/Behavioral: Positive for depressed mood and sleep disturbance. The patient is not nervous/anxious.     PMFS History:  Patient Active Problem List   Diagnosis Date Noted  . Primary osteoarthritis of both hands 04/10/2017  . Primary osteoarthritis of both knees 04/10/2017  . DDD (degenerative disc disease), lumbar 04/10/2017  . Mixed connective tissue disease ([place]) 04/10/2017  . S/P trigger finger release bilateral third 03/14/2017  . MGUS (monoclonal gammopathy of unknown significance) 03/11/2017  . History of chronic kidney disease 03/11/2017  . Monoclonal paraproteinemia 01/11/2016  . Restless leg 01/11/2015  . Carpal tunnel syndrome 12/28/2014  . Dysphagia, oropharyngeal 12/28/2014  . Anxiety, generalized 10/14/2014  . Generalized joint pain 08/17/2014  . Encounter for therapeutic drug monitoring  07/13/2014  . Failed back syndrome of lumbar spine 07/13/2014  . Encounter for therapeutic drug level monitoring 07/13/2014  . 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 Diabetes ([place]) 06/16/2014  . Fibrositis 06/16/2014  . Cannot sleep 06/16/2014  . Extreme obesity 06/16/2014  . Neuropathy 06/16/2014  . Allergic  rhinitis, seasonal 06/16/2014  . Post menopausal syndrome 06/16/2014  . Gougerout-Sjoegren syndrome ([place]) 06/16/2014  . Spinal stenosis 06/16/2014  . Adult hypothyroidism 06/16/2014  . 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 Diabetes mellitus ([place]) 06/16/2014  . Fibromyalgia 06/16/2014  . Morbid obesity ([place]) 06/16/2014  . Sjogren's syndrome ([place]) 06/16/2014  . Breath shortness 01/21/2014  . Essential (primary) hypertension 01/21/2014  . Acid reflux 01/21/2014  . Adiposity 01/21/2014  . Clinical depression 08/26/2013  . Obstructive apnea 01/17/2013  . 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 Pituitary adenoma ([place]) 05/23/2011    Past Medical History:  Diagnosis Date  . Anxiety   . Arthritis   . Cushing's disease ([place])    pituitary tumor removed 2012  . Diabetes mellitus    insulin pump.followed dr Peyton Bottoms [facility]  . Difficult intubation    2011 scratched trachea  . Fibromyalgia   . GERD (gastroesophageal reflux disease)   . Headache(784.0)   . Hypothyroidism   . Lumbar disc disease   . Neuropathy   . Sjogren's disease ([place])   . Sleep apnea    no cpap.sleep study 2006 [place]    Family History  Problem Relation Age of Onset  . Diabetes type II Mother   . Diabetes type II Father   . Heart disease Father   . Heart attack Father   . Colon cancer Maternal Grandmother   . Heart disease Maternal Grandfather    Past Surgical History:  Procedure Laterality Date  . APPENDECTOMY    . BACK SURGERY     lumbar fusion  . BRAIN SURGERY    . CESAREAN SECTION    . ENDOMETRIAL ABLATION    . FRACTURE SURGERY     l arm  . LUMBAR LAMINECTOMY/DECOMPRESSION MICRODISCECTOMY  11/13/2011   Procedure: LUMBAR LAMINECTOMY/DECOMPRESSION MICRODISCECTOMY;  Surgeon: Faythe Ghee, MD;  Location: [facility];  Service: Neurosurgery;  Laterality: Bilateral;  Bilateral Lumbar four-five Decompression  . TONSILLECTOMY    . TRANSPHENOIDAL / TRANSNASAL HYPOPHYSECTOMY /  RESECTION PITUITARY TUMOR    . TRIGGER FINGER RELEASE    . TUBAL LIGATION     Social History   Social History Narrative  . Not on file     Objective: Vital Signs: BP (!) 127/59   Pulse 90   Resp 16   Ht 5\' 3"  (1.6 m)   Wt 264 lb (119.7 kg)   LMP 11/08/2006   BMI 46.77 kg/m    Physical Exam  Constitutional: She is oriented to person, place, and time. She appears well-developed and well-nourished.  HENT:  Head: Normocephalic and atraumatic.  Eyes: Conjunctivae and EOM are normal.  Neck: Normal range of motion.  Cardiovascular: Normal rate, regular rhythm, normal heart sounds and intact distal pulses.  Pulmonary/Chest: Effort normal and breath sounds normal.  Abdominal: Soft. Bowel sounds are normal.  Lymphadenopathy:    She has no cervical adenopathy.  Neurological: She is alert and oriented to person, place, and time.  Skin: Skin is warm and dry. Capillary refill takes less than 2 seconds.  Psychiatric: She has a normal mood and affect. Her behavior is normal.  Nursing note and vitals reviewed.    Musculoskeletal Exam:  C-spine and lumbar limited ROM. Shoulder joints and elbow joints good ROM with no discomfort.  Wrist joints limited ROM bilaterally.  MCPs and PIPs, tender to palpation. Fist formation 80%.  Hip joints, knee joints, and ankle joints good ROM with no synovitis.  MTPs, PIPs, and DIPs good ROM with no synovitis.  No tenderness of trochanteric bursa.  No midline spinal tenderness.   CDAI Exam: No CDAI exam completed.    Investigation: No additional findings.PLQ eye exam: 06/19/2017 CBC Latest Ref Rng & Units 03/14/2017 11/08/2011 03/27/2010  WBC 3.8 - 10.8 K/uL 11.6(H) 11.8(H) 16.6(H)  Hemoglobin 11.7 - 15.5 g/dL 12.4 13.2 14.1  Hematocrit 35.0 - 45.0 % 39.0 40.0 43.2  Platelets 140 - 400 K/uL 270 308 330   CMP Latest Ref Rng & Units 03/14/2017 11/08/2011 03/27/2010  Glucose 65 - 99 mg/dL 165(H) 162(H) 144(H)  BUN 7 - 25 mg/dL 13 13 16   Creatinine 0.50 - 0.99  mg/dL 0.62 0.59 0.62  Sodium 135 - 146 mmol/L 138 137 138  Potassium 3.5 - 5.3 mmol/L 4.5 3.8 4.2  Chloride 98 - 110 mmol/L 101 98 102  CO2 20 - 31 mmol/L 22 27 27   Calcium 8.6 - 10.4 mg/dL 8.6 9.9 9.8  Total Protein 6.1 - 8.1 g/dL 7.2 8.0 -  Total Bilirubin 0.2 - 1.2 mg/dL 0.3 0.2(L) -  Alkaline Phos 33 - 130 U/L 82 87 -  AST 10 - 35 U/L 19 33 -  ALT 6 - 29 U/L 15 25 -    Imaging: No results found.  Speciality Comments: PLQ eye exam: 06/19/2017 Normal. [facility]. Follow up in 6 months.    Procedures:  No procedures performed Allergies: Atorvastatin; Codeine; and Quinapril hcl   Assessment / Plan:     Visit Diagnoses: Mixed connective tissue disease ([place]) - ANA 1:160 NS, RNP positive, inflammatory arthritis. Positive synovitis on ultrasound.   Patient continues to report hand pain and swelling.  She has no synovitis on exam.  Her swelling is likely due to fluid retention She has been on Plaquenil since her last visit 04/16/17. She takes Plaquenil 200 mg twice daily M-F. She previously had a hand ultrasound that revealed synovitis. She states the pain is not as severe but the swelling has not improved.  She will continue on PLQ at this time and we will schedule an ultrasound of her bilateral hands and wrists.    Palpitations - No recent episodes.  Patient followed up with cardiology and was cleared after wearing a halter monitor for 2 weeks.    Sjogren's syndrome with keratoconjunctivitis sicca ([place]): Continues to have sicca symptoms.  She uses OTC products.     High risk medication use - PLQ-eye exam: 06/19/2017, CBC and CMP drawn 04/16/17.  She will need routine CBC and CMP labs drawn in February 2019 to monitor for drug toxicity.    Primary osteoarthritis of both hands: No synovitis.  No pain in DIP joints.  PIP synovial thickening present. Discussed muscle strengthening and joint protection.    Primary osteoarthritis of both knees: Doing well. occasionally has left knee pain  and mild swelling. No effusion or warmth on exam today.    DDD (degenerative disc disease), lumbar - s/p laminectomy: Stable   History of fibromyalgia: tender points on medial aspects of calves, knees, and thighs.  Otherwise, her fibromyalgia is well controlled.  She continues to have insomnia and has been taking tylenol PM when she has pain to sleep through the  night.    History of chronic pain - She is going to pain clinic now and she was started  Nucynta.  Other medical conditions are listed as follows:   History of Cushing disease - Followed up by her endocrinologist  History of diabetes mellitus  History of chronic kidney disease  MGUS (monoclonal gammopathy of unknown significance) - Followed up by hematology  History of obesity  History of depression  History of anxiety  History of hypothyroidism - Followed by endocrinologist    Orders: No orders of the defined types were placed in this encounter.  No orders of the defined types were placed in this encounter.   Face-to-face time spent with patient was 30 minutes. Greater than 50% of time was spent in counseling and coordination of care.  Follow-Up Instructions: Return in about 5 months (around 12/30/2017) for Mixed connective tissue disease , Sjogren's syndrome, Fibromyalgia, Osteoarthritis.  Bo Merino, MD   Note - This record has been created using Editor, commissioning.  Chart creation errors have been sought, but may not always  have been located. Such creation errors do not reflect on  the standard of medical care.

## 2017-08-01 ENCOUNTER — Ambulatory Visit (INDEPENDENT_AMBULATORY_CARE_PROVIDER_SITE_OTHER): Admitting: Rheumatology

## 2017-08-01 ENCOUNTER — Encounter: Payer: Self-pay | Admitting: Rheumatology

## 2017-08-01 VITALS — BP 127/59 | HR 90 | Resp 16 | Ht 63.0 in | Wt 264.0 lb

## 2017-08-01 DIAGNOSIS — R002 Palpitations: Secondary | ICD-10-CM

## 2017-08-01 DIAGNOSIS — M351 Other overlap syndromes: Secondary | ICD-10-CM

## 2017-08-01 DIAGNOSIS — Z87448 Personal history of other diseases of urinary system: Secondary | ICD-10-CM | POA: Diagnosis not present

## 2017-08-01 DIAGNOSIS — Z8659 Personal history of other mental and behavioral disorders: Secondary | ICD-10-CM

## 2017-08-01 DIAGNOSIS — M19042 Primary osteoarthritis, left hand: Secondary | ICD-10-CM

## 2017-08-01 DIAGNOSIS — Z79899 Other long term (current) drug therapy: Secondary | ICD-10-CM

## 2017-08-01 DIAGNOSIS — Z8739 Personal history of other diseases of the musculoskeletal system and connective tissue: Secondary | ICD-10-CM

## 2017-08-01 DIAGNOSIS — M3501 Sicca syndrome with keratoconjunctivitis: Secondary | ICD-10-CM

## 2017-08-01 DIAGNOSIS — Z87898 Personal history of other specified conditions: Secondary | ICD-10-CM

## 2017-08-01 DIAGNOSIS — M170 Bilateral primary osteoarthritis of knee: Secondary | ICD-10-CM

## 2017-08-01 DIAGNOSIS — D472 Monoclonal gammopathy: Secondary | ICD-10-CM | POA: Diagnosis not present

## 2017-08-01 DIAGNOSIS — Z8639 Personal history of other endocrine, nutritional and metabolic disease: Secondary | ICD-10-CM | POA: Diagnosis not present

## 2017-08-01 DIAGNOSIS — M5136 Other intervertebral disc degeneration, lumbar region: Secondary | ICD-10-CM | POA: Diagnosis not present

## 2017-08-01 DIAGNOSIS — M19041 Primary osteoarthritis, right hand: Secondary | ICD-10-CM | POA: Diagnosis not present

## 2017-08-20 NOTE — Progress Notes (Signed)
Patient had synovitis and tenosynovitis on the examination today. We will schedule a follow-up appointment discuss additional DMARD therapy. Bo Merino, MD

## 2017-08-21 ENCOUNTER — Ambulatory Visit (INDEPENDENT_AMBULATORY_CARE_PROVIDER_SITE_OTHER): Admitting: Rheumatology

## 2017-08-21 ENCOUNTER — Inpatient Hospital Stay (INDEPENDENT_AMBULATORY_CARE_PROVIDER_SITE_OTHER): Payer: Self-pay

## 2017-08-21 DIAGNOSIS — M79641 Pain in right hand: Secondary | ICD-10-CM

## 2017-08-21 DIAGNOSIS — M79642 Pain in left hand: Secondary | ICD-10-CM | POA: Diagnosis not present

## 2017-08-26 NOTE — Progress Notes (Signed)
Office Visit Note  Patient: Gloria Porter             Date of Birth: [birth year]           MRN: [id number]             PCP: Florestine Avers (Inactive) Referring: No ref. provider found Visit Date: 09/02/2017 Occupation: @GUAROCC @    Subjective:  Hand pain   History of Present Illness: Gloria Porter is a 62 y.o. female with history of mixed connective tissue disease, osteoarthritis, and DDD.  Patient states she continues to have hand pain and swelling.  She has been taking her PLQ 200 mg BID M-F.  She also has pain in her right knee.  She has been using Voltaren gel.  She has been taking Ativan PRN at bedtime to help her sleep due to pain from neuropathy. She has significant joint stiffness.     Activities of Daily Living:  Patient reports morning stiffness for 5-10  minutes.   Patient Reports nocturnal pain.  Difficulty dressing/grooming: Denies Difficulty climbing stairs: Reports Difficulty getting out of chair: Reports Difficulty using hands for taps, buttons, cutlery, and/or writing: Reports   Review of Systems  Constitutional: Positive for fatigue. Negative for weakness.  HENT: Positive for mouth sores and mouth dryness. Negative for nose dryness.   Eyes: Positive for dryness. Negative for redness.  Respiratory: Negative for cough, hemoptysis, shortness of breath and difficulty breathing.   Cardiovascular: Negative for chest pain, palpitations, hypertension, irregular heartbeat and swelling in legs/feet.  Gastrointestinal: Negative for blood in stool, constipation and diarrhea.  Endocrine: Negative for increased urination.  Genitourinary: Negative for painful urination.  Musculoskeletal: Positive for arthralgias, joint pain, joint swelling and morning stiffness. Negative for myalgias, muscle weakness, muscle tenderness and myalgias.  Skin: Positive for skin tightness. Negative for color change, pallor, rash, hair loss, nodules/bumps, redness, ulcers and sensitivity  to sunlight.  Neurological: Negative for dizziness, numbness and headaches.  Hematological: Negative for swollen glands.  Psychiatric/Behavioral: Positive for depressed mood and sleep disturbance. The patient is nervous/anxious (Ativan PRN at bedtime).     PMFS History:  Patient Active Problem List   Diagnosis Date Noted  . Primary osteoarthritis of both hands 04/10/2017  . Primary osteoarthritis of both knees 04/10/2017  . DDD (degenerative disc disease), lumbar 04/10/2017  . Mixed connective tissue disease ([place]) 04/10/2017  . S/P trigger finger release bilateral third 03/14/2017  . MGUS (monoclonal gammopathy of unknown significance) 03/11/2017  . History of chronic kidney disease 03/11/2017  . Monoclonal paraproteinemia 01/11/2016  . Restless leg 01/11/2015  . Carpal tunnel syndrome 12/28/2014  . Dysphagia, oropharyngeal 12/28/2014  . Anxiety, generalized 10/14/2014  . Generalized joint pain 08/17/2014  . Encounter for therapeutic drug monitoring 07/13/2014  . Failed back syndrome of lumbar spine 07/13/2014  . Encounter for therapeutic drug level monitoring 07/13/2014  . 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 Diabetes ([place]) 06/16/2014  . Fibrositis 06/16/2014  . Cannot sleep 06/16/2014  . Extreme obesity 06/16/2014  . Neuropathy 06/16/2014  . Allergic rhinitis, seasonal 06/16/2014  . Post menopausal syndrome 06/16/2014  . 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 Spinal stenosis 06/16/2014  . Adult hypothyroidism 06/16/2014  . 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 Diabetes mellitus ([place]) 06/16/2014  . Fibromyalgia 06/16/2014  . Morbid obesity ([place]) 06/16/2014  . Sjogren's syndrome ([place]) 06/16/2014  . Breath shortness 01/21/2014  . Essential (primary) hypertension 01/21/2014  . Acid reflux 01/21/2014  . Adiposity 01/21/2014  .  Clinical depression 08/26/2013  . Obstructive apnea 01/17/2013  . Cushing's syndrome ([place]) 05/23/2011  . Pituitary adenoma  ([place]) 05/23/2011    Past Medical History:  Diagnosis Date  . Anxiety   . Arthritis   . Cushing's disease ([place])    pituitary tumor removed 2012  . Diabetes mellitus    insulin pump.followed dr Peyton Bottoms [facility]  . Difficult intubation    2011 scratched trachea  . Fibromyalgia   . GERD (gastroesophageal reflux disease)   . Headache(784.0)   . Hypothyroidism   . Lumbar disc disease   . Neuropathy   . Sjogren's disease ([place])   . Sleep apnea    no cpap.sleep study 2006 [facility]    Family History  Problem Relation Age of Onset  . Diabetes type II Mother   . Diabetes type II Father   . Heart disease Father   . Heart attack Father   . Colon cancer Maternal Grandmother   . Heart disease Maternal Grandfather    Past Surgical History:  Procedure Laterality Date  . APPENDECTOMY    . BACK SURGERY     lumbar fusion  . BRAIN SURGERY    . CESAREAN SECTION    . ENDOMETRIAL ABLATION    . FRACTURE SURGERY     l arm  . LUMBAR LAMINECTOMY/DECOMPRESSION MICRODISCECTOMY  11/13/2011   Procedure: LUMBAR LAMINECTOMY/DECOMPRESSION MICRODISCECTOMY;  Surgeon: Faythe Ghee, MD;  Location: [facility];  Service: Neurosurgery;  Laterality: Bilateral;  Bilateral Lumbar four-five Decompression  . TONSILLECTOMY    . TRANSPHENOIDAL / TRANSNASAL HYPOPHYSECTOMY / RESECTION PITUITARY TUMOR    . TRIGGER FINGER RELEASE    . TUBAL LIGATION     Social History   Social History Narrative  . Not on file     Objective: Vital Signs: BP 113/61 (BP Location: Left Arm, Patient Position: Sitting, Cuff Size: Normal)   Pulse 78   Resp 18   Ht 5' 3.5" (1.613 m)   Wt 259 lb (117.5 kg)   LMP 11/08/2006   BMI 45.16 kg/m    Physical Exam  Constitutional: She is oriented to person, place, and time. She appears well-developed and well-nourished.  HENT:  Head: Normocephalic and atraumatic.  Eyes: Conjunctivae and EOM are normal.  Neck: Normal range of motion.  Cardiovascular: Normal rate,  regular rhythm, normal heart sounds and intact distal pulses.  Pulmonary/Chest: Effort normal and breath sounds normal.  Abdominal: Soft. Bowel sounds are normal.  Lymphadenopathy:    She has no cervical adenopathy.  Neurological: She is alert and oriented to person, place, and time.  Skin: Skin is warm and dry. Capillary refill takes less than 2 seconds.  Psychiatric: She has a normal mood and affect. Her behavior is normal.  Nursing note and vitals reviewed.    Musculoskeletal Exam: C-spine, thoracic, and lumbar spine good ROM.  Shoulder joints, elbow joints, MCPs, PIPs, and DIPs good ROM. Limited wrist ROM.  Some limitation in fist formation. She has skin thickness of her hands and feet.  She is tender on wrists, MCPs, and PIPs.  Hip joints, knee joints, ankle joints, MTPs, PIPs, and DIPs good ROM with no synovitis.    CDAI Exam: No CDAI exam completed.    Investigation: No additional findings.PLQ eye exam: 06/19/2017 CBC Latest Ref Rng & Units 03/14/2017 11/08/2011 03/27/2010  WBC 3.8 - 10.8 K/uL 11.6(H) 11.8(H) 16.6(H)  Hemoglobin 11.7 - 15.5 g/dL 12.4 13.2 14.1  Hematocrit 35.0 - 45.0 % 39.0 40.0 43.2  Platelets 140 -  400 K/uL 270 308 330   CMP Latest Ref Rng & Units 03/14/2017 11/08/2011 03/27/2010  Glucose 65 - 99 mg/dL 165(H) 162(H) 144(H)  BUN 7 - 25 mg/dL 13 13 16   Creatinine 0.50 - 0.99 mg/dL 0.62 0.59 0.62  Sodium 135 - 146 mmol/L 138 137 138  Potassium 3.5 - 5.3 mmol/L 4.5 3.8 4.2  Chloride 98 - 110 mmol/L 101 98 102  CO2 20 - 31 mmol/L 22 27 27   Calcium 8.6 - 10.4 mg/dL 8.6 9.9 9.8  Total Protein 6.1 - 8.1 g/dL 7.2 8.0 -  Total Bilirubin 0.2 - 1.2 mg/dL 0.3 0.2(L) -  Alkaline Phos 33 - 130 U/L 82 87 -  AST 10 - 35 U/L 19 33 -  ALT 6 - 29 U/L 15 25 -    Imaging: [place] Extrem Up Bilat Comp  Result Date: 08/22/2017 Ultrasound examination of bilateral hands was performed per EULAR recommendations. Using 12 MHz transducer, grayscale and power Doppler bilateral second, third,  and fifth MCP joints and bilateral wrist joints both dorsal and volar aspects were evaluated to look for synovitis or tenosynovitis. The findings were there was  synovitis in right second third and fifth MCP joint right second PIP joint and bilateral wrist joints. Tenosynovitis was also noted in bilateral wrist joints on ultrasound examination. Right median nerve was 0.09 cm squares which was within normal limits and left median nerve was 0.09 cm squares which was within normal limits. Impression: Ultrasound examination revealed active synovitis and tenosynovitis.   Speciality Comments: PLQ eye exam: 06/19/2017 Normal. [facility]. Follow up in 6 months.    Procedures:  No procedures performed Allergies: Atorvastatin; Codeine; and Quinapril hcl     Assessment / Plan:     Visit Diagnoses: Mixed connective tissue disease ([place]) - ANA 1:160 NS, RNP positive, inflammatory arthritis.Synovitis and tenosynovitis on ultrasound of hands on 08/21/17.  -She will continue on Plaquenil 200 mg BID M-F.  Voltaren and ace wrap around right knee have helped.  She uses voltaren gel on her hands as well. As she continues to have some joint pain and stiffness and swelling. Different treatment options and their side effects were discussed at length. She agreed to proceed with methotrexate. She will be started on methotrexate 4 tablets.  We will check CBC and CMP 2 weeks after starting MTX.  We will call in the prescription for folic acid 2 mg daily and MTX 4 tablets weekly after her lab results come back.  If her labs are stable 2 weeks after starting we will increase her dose to 6 tablets weekly.  She is not complaining of any shortness of breath. But due to her history of mixed connective tissue disease will make a referral to Dr. Chase Caller.  A referral to Dr. Chase Caller was placed for evaluation.  She will need PFTs.  A referral to dermatologist was placed for a  skin biopsy to differentiate between scleroderma and  diabetic sclerosis.  She has skin tightness in hands and feet.  She also has diabetes, which could be leading to fibrosis of the skin.  She reports occasional dysphagia.  She has no reported raynaud's or on exam.   Plan: Ambulatory referral to pulmonology and dermatology  Drug Counseling TB Gold: negative 04/10/17 Hepatitis panel: Non-reactive 04/16/17  Chest-xray:  2013  Contraception: Discussed   Alcohol use: Discussed  Patient was counseled on the purpose, proper use, and adverse effects of methotrexate including nausea, infection, and signs and symptoms of  pneumonitis.  Reviewed instructions with patient to take methotrexate weekly along with folic acid daily.  Discussed the importance of frequent monitoring of kidney and liver function and blood counts, and provided patient with standing lab instructions.  Counseled patient to avoid NSAIDs and alcohol while on methotrexate.  Provided patient with educational materials on methotrexate and answered all questions.  Advised patient to get annual influenza vaccine and to get a pneumococcal vaccine if patient has not already had one.  Patient voiced understanding.  Patient consented to methotrexate use.  Will upload into chart.   High risk medication use - PLQ eye exam: 06/19/2017-CBC and CMP were drawn today.  We will recheck labs 2 weeks after starting MTX.  She will continue on PLQ. - Plan: CBC with Differential/Platelet, COMPLETE METABOLIC PANEL WITH GFR  Skin thickening - She has diffuse skin tightness and skin thickening.  A referral was made to dermatology to perform a skin biopsy to differentiate between scleroderma vs. Diabetic sclerosis. Plan: Ambulatory referral to Dermatology   Palpitations - No recent episodes.  Patient followed up with cardiology and was cleared after wearing a halter monitor for 2 weeks.    Sjogren's syndrome with keratoconjunctivitis sicca ([place]) - Continues to have sicca symptoms.  She uses OTC products, which help.       Primary osteoarthritis of both hands: PIP and DIP synovial thickening consistent with osteoarthritis.  Joint protection and muscle strengthening were discussed.    Primary osteoarthritis of both knees: No effusion or warmth on exam today.    DDD (degenerative disc disease), lumbar - s/p laminectomy  Fibromyalgia - She has tender points on medial aspects of calves, knees, and thighs.  Her fibromyalgia is well controlled.   History of chronic pain - She takes Nucynta PRN.  She goes to a pain clinic.   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 CBC with Differential/Platelet  . COMPLETE METABOLIC PANEL WITH GFR  . Ambulatory referral to Pulmonology  . Ambulatory referral to Dermatology   No orders of the defined types were placed in this encounter.   Face-to-face time spent with patient was 30 minutes.Greater than 50% of time was spent in counseling and coordination of care.  Follow-Up Instructions: Return for MCTD, Osteoarthritis, Sjogren's syndrome.   Bo Merino, MD  Note - This record has been created using Editor, commissioning.  Chart creation errors have been sought, but may not always  have been located. Such creation errors do not reflect on  the standard of medical care.

## 2017-09-02 ENCOUNTER — Ambulatory Visit (INDEPENDENT_AMBULATORY_CARE_PROVIDER_SITE_OTHER): Admitting: Rheumatology

## 2017-09-02 ENCOUNTER — Encounter: Payer: Self-pay | Admitting: Rheumatology

## 2017-09-02 VITALS — BP 113/61 | HR 78 | Resp 18 | Ht 63.5 in | Wt 259.0 lb

## 2017-09-02 DIAGNOSIS — Z87898 Personal history of other specified conditions: Secondary | ICD-10-CM

## 2017-09-02 DIAGNOSIS — M170 Bilateral primary osteoarthritis of knee: Secondary | ICD-10-CM | POA: Diagnosis not present

## 2017-09-02 DIAGNOSIS — M351 Other overlap syndromes: Secondary | ICD-10-CM

## 2017-09-02 DIAGNOSIS — M5136 Other intervertebral disc degeneration, lumbar region: Secondary | ICD-10-CM

## 2017-09-02 DIAGNOSIS — Z87448 Personal history of other diseases of urinary system: Secondary | ICD-10-CM

## 2017-09-02 DIAGNOSIS — M19041 Primary osteoarthritis, right hand: Secondary | ICD-10-CM | POA: Diagnosis not present

## 2017-09-02 DIAGNOSIS — Z8659 Personal history of other mental and behavioral disorders: Secondary | ICD-10-CM | POA: Diagnosis not present

## 2017-09-02 DIAGNOSIS — Z8639 Personal history of other endocrine, nutritional and metabolic disease: Secondary | ICD-10-CM | POA: Diagnosis not present

## 2017-09-02 DIAGNOSIS — D472 Monoclonal gammopathy: Secondary | ICD-10-CM

## 2017-09-02 DIAGNOSIS — R002 Palpitations: Secondary | ICD-10-CM

## 2017-09-02 DIAGNOSIS — Z79899 Other long term (current) drug therapy: Secondary | ICD-10-CM

## 2017-09-02 DIAGNOSIS — R234 Changes in skin texture: Secondary | ICD-10-CM

## 2017-09-02 DIAGNOSIS — M797 Fibromyalgia: Secondary | ICD-10-CM | POA: Diagnosis not present

## 2017-09-02 DIAGNOSIS — M19042 Primary osteoarthritis, left hand: Secondary | ICD-10-CM

## 2017-09-02 DIAGNOSIS — M3501 Sicca syndrome with keratoconjunctivitis: Secondary | ICD-10-CM | POA: Diagnosis not present

## 2017-09-02 NOTE — Patient Instructions (Addendum)
Standing Labs We placed an order today for your standing lab work.    Please come back and get your standing labs 2 weeks after starting MTX  We have open lab Monday through Friday from 8:30-11:30 AM and 1:30-4 PM at the office of Dr. Bo Merino.   The office is located at [address], [place], [place], [place] [address] No appointment is necessary.   Labs are drawn by [facility].  You may receive a bill from [facility] for your lab work. If you have any questions regarding directions or hours of operation,  please call [phone].     Methotrexate tablets What is this medicine? METHOTREXATE (METH oh TREX ate) is a chemotherapy drug used to treat cancer including breast cancer, leukemia, and lymphoma. This medicine can also be used to treat psoriasis and certain kinds of arthritis. This medicine may be used for other purposes; ask your health care provider or pharmacist if you have questions. COMMON BRAND NAME(S): Rheumatrex, Trexall What should I tell my health care provider before I take this medicine? They need to know if you have any of these conditions: -fluid in the stomach area or lungs -if you often drink alcohol -infection or immune system problems -kidney disease or on hemodialysis -liver disease -low blood counts, like low white cell, platelet, or red cell counts -lung disease -radiation therapy -stomach ulcers -ulcerative colitis -an unusual or allergic reaction to methotrexate, other medicines, foods, dyes, or preservatives -pregnant or trying to get pregnant -breast-feeding How should I use this medicine? Take this medicine by mouth with a glass of water. Follow the directions on the prescription label. Take your medicine at regular intervals. Do not take it more often than directed. Do not stop taking except on your doctor's advice. Make sure you know why you are taking this medicine and how often you should take it. If this medicine is used for a condition  that is not cancer, like arthritis or psoriasis, it should be taken weekly, NOT daily. Taking this medicine more often than directed can cause serious side effects, even death. Talk to your healthcare provider about safe handling and disposal of this medicine. You may need to take special precautions. Talk to your pediatrician regarding the use of this medicine in children. While this drug may be prescribed for selected conditions, precautions do apply. Overdosage: If you think you have taken too much of this medicine contact a poison control center or emergency room at once. NOTE: This medicine is only for you. Do not share this medicine with others. What if I miss a dose? If you miss a dose, talk with your doctor or health care professional. Do not take double or extra doses. What may interact with this medicine? This medicine may interact with the following medication: -acitretin -aspirin and aspirin-like medicines including salicylates -azathioprine -certain antibiotics like penicillins, tetracycline, and chloramphenicol -cyclosporine -gold -hydroxychloroquine -live virus vaccines -NSAIDs, medicines for pain and inflammation, like ibuprofen or naproxen -other cytotoxic agents -penicillamine -phenylbutazone -phenytoin -probenecid -retinoids such as isotretinoin and tretinoin -steroid medicines like prednisone or cortisone -sulfonamides like sulfasalazine and trimethoprim/sulfamethoxazole -theophylline This list may not describe all possible interactions. Give your health care provider a list of all the medicines, herbs, non-prescription drugs, or dietary supplements you use. Also tell them if you smoke, drink alcohol, or use illegal drugs. Some items may interact with your medicine. What should I watch for while using this medicine? Avoid alcoholic drinks. This medicine can make you more sensitive to  the sun. Keep out of the sun. If you cannot avoid being in the sun, wear protective  clothing and use sunscreen. Do not use sun lamps or tanning beds/booths. You may need blood work done while you are taking this medicine. Call your doctor or health care professional for advice if you get a fever, chills or sore throat, or other symptoms of a cold or flu. Do not treat yourself. This drug decreases your body's ability to fight infections. Try to avoid being around people who are sick. This medicine may increase your risk to bruise or bleed. Call your doctor or health care professional if you notice any unusual bleeding. Check with your doctor or health care professional if you get an attack of severe diarrhea, nausea and vomiting, or if you sweat a lot. The loss of too much body fluid can make it dangerous for you to take this medicine. Talk to your doctor about your risk of cancer. You may be more at risk for certain types of cancers if you take this medicine. Both men and women must use effective birth control with this medicine. Do not become pregnant while taking this medicine or until at least 1 normal menstrual cycle has occurred after stopping it. Women should inform their doctor if they wish to become pregnant or think they might be pregnant. Men should not father a child while taking this medicine and for 3 months after stopping it. There is a potential for serious side effects to an unborn child. Talk to your health care professional or pharmacist for more information. Do not breast-feed an infant while taking this medicine. What side effects may I notice from receiving this medicine? Side effects that you should report to your doctor or health care professional as soon as possible: -allergic reactions like skin rash, itching or hives, swelling of the face, lips, or tongue -breathing problems or shortness of breath -diarrhea -dry, nonproductive cough -low blood counts - this medicine may decrease the number of white blood cells, red blood cells and platelets. You may be at  increased risk for infections and bleeding. -mouth sores -redness, blistering, peeling or loosening of the skin, including inside the mouth -signs of infection - fever or chills, cough, sore throat, pain or trouble passing urine -signs and symptoms of bleeding such as bloody or black, tarry stools; red or dark-brown urine; spitting up blood or brown material that looks like coffee grounds; red spots on the skin; unusual bruising or bleeding from the eye, gums, or nose -signs and symptoms of kidney injury like trouble passing urine or change in the amount of urine -signs and symptoms of liver injury like dark yellow or brown urine; general ill feeling or flu-like symptoms; light-colored stools; loss of appetite; nausea; right upper belly pain; unusually weak or tired; yellowing of the eyes or skin Side effects that usually do not require medical attention (report to your doctor or health care professional if they continue or are bothersome): -dizziness -hair loss -tiredness -upset stomach -vomiting This list may not describe all possible side effects. Call your doctor for medical advice about side effects. You may report side effects to FDA at 1-800-FDA-1088. Where should I keep my medicine? Keep out of the reach of children. Store at room temperature between 20 and 25 degrees C (68 and 77 degrees F). Protect from light. Throw away any unused medicine after the expiration date. NOTE: This sheet is a summary. It may not cover all possible information. If you have  questions about this medicine, talk to your doctor, pharmacist, or health care provider.  2018 Elsevier/Gold Standard (2015-04-04 05:39:22)

## 2017-09-10 ENCOUNTER — Other Ambulatory Visit: Payer: Self-pay

## 2017-09-10 ENCOUNTER — Encounter: Payer: Self-pay | Admitting: Neurology

## 2017-09-10 ENCOUNTER — Ambulatory Visit (INDEPENDENT_AMBULATORY_CARE_PROVIDER_SITE_OTHER): Admitting: Neurology

## 2017-09-10 VITALS — BP 131/73 | HR 80 | Ht 63.5 in | Wt 256.0 lb

## 2017-09-10 DIAGNOSIS — M351 Other overlap syndromes: Secondary | ICD-10-CM

## 2017-09-10 DIAGNOSIS — D472 Monoclonal gammopathy: Secondary | ICD-10-CM | POA: Diagnosis not present

## 2017-09-10 DIAGNOSIS — G629 Polyneuropathy, unspecified: Secondary | ICD-10-CM

## 2017-09-10 DIAGNOSIS — M797 Fibromyalgia: Secondary | ICD-10-CM

## 2017-09-10 DIAGNOSIS — Z79899 Other long term (current) drug therapy: Secondary | ICD-10-CM

## 2017-09-10 DIAGNOSIS — G4733 Obstructive sleep apnea (adult) (pediatric): Secondary | ICD-10-CM

## 2017-09-10 LAB — COMPLETE METABOLIC PANEL WITH GFR
AG Ratio: 1.1 (calc) (ref 1.0–2.5)
ALBUMIN MSPROF: 4 g/dL (ref 3.6–5.1)
ALKALINE PHOSPHATASE (APISO): 78 U/L (ref 33–130)
ALT: 17 U/L (ref 6–29)
AST: 19 U/L (ref 10–35)
BUN: 17 mg/dL (ref 7–25)
CALCIUM: 9.3 mg/dL (ref 8.6–10.4)
CO2: 25 mmol/L (ref 20–32)
CREATININE: 0.75 mg/dL (ref 0.50–0.99)
Chloride: 103 mmol/L (ref 98–110)
GFR, EST AFRICAN AMERICAN: 100 mL/min/{1.73_m2} (ref >=60)
GFR, EST NON AFRICAN AMERICAN: 86 mL/min/{1.73_m2} (ref >=60)
GLOBULIN: 3.7 g/dL (ref 1.9–3.7)
Glucose, Bld: 225 mg/dL — ABNORMAL HIGH (ref 65–99)
Potassium: 4.2 mmol/L (ref 3.5–5.3)
SODIUM: 135 mmol/L (ref 135–146)
TOTAL PROTEIN: 7.7 g/dL (ref 6.1–8.1)
Total Bilirubin: 0.4 mg/dL (ref 0.2–1.2)

## 2017-09-10 LAB — CBC WITH DIFFERENTIAL/PLATELET
BASOS ABS: 41 {cells}/uL (ref 0–200)
BASOS PCT: 0.6 %
EOS PCT: 1.5 %
Eosinophils Absolute: 102 cells/uL (ref 15–500)
HEMATOCRIT: 40.9 % (ref 35.0–45.0)
HEMOGLOBIN: 13.1 g/dL (ref 11.7–15.5)
LYMPHS ABS: 1108 {cells}/uL (ref 850–3900)
MCH: 26.4 pg — ABNORMAL LOW (ref 27.0–33.0)
MCHC: 32 g/dL (ref 32.0–36.0)
MCV: 82.5 fL (ref 80.0–100.0)
MONOS PCT: 6.5 %
MPV: 10.6 fL (ref 7.5–12.5)
NEUTROS ABS: 5107 {cells}/uL (ref 1500–7800)
Neutrophils Relative %: 75.1 %
Platelets: 206 10*3/uL (ref 140–400)
RBC: 4.96 10*6/uL (ref 3.80–5.10)
RDW: 16 % — ABNORMAL HIGH (ref 11.0–15.0)
Total Lymphocyte: 16.3 %
WBC mixed population: 442 cells/uL (ref 200–950)
WBC: 6.8 10*3/uL (ref 3.8–10.8)

## 2017-09-10 MED ORDER — IMIPRAMINE HCL 25 MG PO TABS: ORAL_TABLET | ORAL | 4 refills | Status: DC

## 2017-09-10 MED ORDER — HORIZANT 600 MG PO TBCR: 600.0000 mg | EXTENDED_RELEASE_TABLET | Freq: Two times a day (BID) | ORAL | 3 refills | Status: DC

## 2017-09-10 MED ORDER — LIDOCAINE 5 % EX OINT: 1.0000 "application " | TOPICAL_OINTMENT | CUTANEOUS | 11 refills | Status: DC | PRN

## 2017-09-10 NOTE — Addendum Note (Signed)
Addended by: Nena Jordan on: 09/10/2017 01:34 PM   Modules accepted: Orders

## 2017-09-10 NOTE — Progress Notes (Signed)
[facility]  PATIENT: Gloria Porter DOB: [birth year]  REFERRING DOCTOR OR PCP:  Idamae Schuller SOURCE: paitent  _________________________________   HISTORICAL  CHIEF COMPLAINT:  Chief Complaint  Patient presents with  . Follow-up    Patient reports that she has since seen Dr. Estanislado Pandy and diagnosed with a connective tissue disorder.   . Peripheral Neuropathy    Patient reports that her feet are still giving her a lot of trouble.     HISTORY OF PRESENT ILLNESS:  Gloria Porter is a 62 y.o. woman with neuropathy and fibromyalgia.      Update 09/10/2017: She was recently diagnosed with mixed connective tissue disorder by Dr. Estanislado Pandy. The ANA has been positive and the ribonucleic protein (ANA) antibody is positive.   Plaquenil was started about 4 months ago and her hand pain and joint pain is less constant.   She may be starting methotrexate soon.   She also has MGUS with a IgG Kappa paraprotein.    Also of note she has a dry mouth  She has foot pain bilaterally that bothers her more when she is at rest or in bed.   Moving will help some.    Lidocaine ointment and wearing socks/footies also help   Horizant also helps the pain/RLS.  She has a h/o OSA.   She uses her old CPAP and she notes she does not snore when she uses it (according to her husband) but she does not feel her sleepiness is any better.       From 01/15/2017: Joint pain:   She is noting more generalized joint pain.  This has worsened over the past year.  Pain is worse in the wrists and fingers and ankles .  Bending her fingers is painful.   She sees Dr. Gerilyn Nestle but no further recommendations were made.   She had a carpal tunnel injection without benefit.     She has been referred to Dr Rochele Pages at [facility] (Pain management) for additional services.  Dilaudid had been prescribed to her previously.     She reports being diagnosed with Sjogren's in the past.   A recent ANA was 1:160 (speckled and  homogenous) and her CRP is also elevated.     Polyneuropathy/restless legs:   She reports a burning dysesthetic pain and RLS.   Pain is stabbing in quality. Pain is worse when she is not moving.   Shifting around helps.      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    She had an elevated M-spike on SPEP and is seeing Dr. Baird Cancer in [facility].    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    Bone survey was normal.   She has a family history of Multiple Myeloma  OSA/Insomnia:   She has OSA and used to use CPAP.   She stopped for a bad cold last year and had trouble resuming so has not restarted.   She falls asleep easy most nights.  However some nights she is up many hours.   She awakens 3-4 times most nights to urinate.   She often has trouble falling back asleep.    Insomnia initially did better with imipramine but is now not doing as well.     She is sleepy during the day and takes  naps.  EPWORTH SLEEPINESS SCALE  On a scale of 0 - 3 what is the chance of dozing:  Sitting and Reading:   3 Watching TV:    1 Sitting inactive in a public place: 0 Passenger in car for one hour: 3 Lying down to rest in the afternoon: 3 Sitting and talking to someone: 0 Sitting quietly after lunch:  1 In a car, stopped in traffic:  0  Total (out of 24):   11/24   Fatigue:   She had low cortisol and she had an ACTH stim test recently which was reportedly blunted but not abnormal.     Fibromyalgia:  Besides joint pain she also has mylagias. She also has dperession and poor sleep.   .  On her med's pain is usually mild but sometimes intensify.   Her worse pain is in the calves and inner thighs.   Current joint pain is worse in the hands.     She has not needed as much hydrocodone.     She is currently on Horizant, imipramine and meloxicam.     LBP/Neck pain:   More recently, she  has had neck pain and wrist pain.   A steroid pack hellped a lot but NSAIDs have not.  She has axial back pain and occasional left leg pain.  She has had lumbar surgery by Dr. Kennon Holter (Neurosurgery) in the past.     She sees Delrae Alfred, PT for physical therapy and feels it might help.    CTS:   She has had bilateral CTR over the past year.     Weight Loss:   Last year, we started phentermine and she lost 15 pounds but regained it back when placed on prednisone.   She stopped before her surgery.   She tolerates it well.    Anxiety/stress:   She gets anxious an notes more stress (her mother in law has cancer and she and her husband travel there a lot).     REVIEW OF SYSTEMS: Constitutional: No fevers, chills, sweats, or change in appetite.  Notes fatigue and sleepiness Eyes: No visual changes, double vision, eye pain Ear, nose and throat: No hearing loss, ear pain, nasal congestion, sore throat Cardiovascular: No chest pain, palpitations Respiratory: No shortness of breath at rest or with exertion.   No wheezes.  Has OSA GastrointestinaI: No nausea, vomiting, diarrhea, abdominal pain, fecal incontinence Genitourinary: Notes urgency and rare incontiennce Musculoskeletal: as above Integumentary: No rash, pruritus, skin lesions Neurological: as above Psychiatric: Notes depression and some anxiety Endocrine: No palpitations, diaphoresis, change in appetite, change in weigh or increased thirst Hematologic/Lymphatic: No anemia, purpura, petechiae. Allergic/Immunologic: No itchy/runny eyes, nasal congestion, recent allergic reactions, rashes  ALLERGIES: Allergies  Allergen Reactions  . Atorvastatin   . Codeine Nausea And Vomiting  . Quinapril Hcl     HOME MEDICATIONS:  Current Outpatient Medications:  .  ACCU-CHEK FASTCLIX LANCETS MISC, , Disp: , Rfl:  .  APPLE CIDER VINEGAR PO, Take by mouth daily., Disp: , Rfl:  .  buPROPion (WELLBUTRIN SR) 150 MG 12 hr tablet, Take 150 mg by mouth  daily., Disp: , Rfl:  .  Calcium Carbonate-Vitamin D (CALCIUM + D PO), Take 1 tablet by mouth daily., Disp: , Rfl:  .  cetirizine (ZYRTEC) 10 MG tablet, Take 10 mg by mouth., Disp: , Rfl:  .  DEXILANT 60 MG capsule, , Disp: , Rfl:  .  diclofenac sodium (VOLTAREN) 1 % GEL, as needed., Disp: , Rfl:  3 .  empagliflozin (JARDIANCE) 10 MG TABS tablet, Take 10 mg by mouth., Disp: , Rfl:  .  FREESTYLE LITE test strip, daily., Disp: , Rfl:  .  furosemide (LASIX) 40 MG tablet, Take 40 mg by mouth daily., Disp: , Rfl:  .  HORIZANT 600 MG TBCR, Take 600 mg by mouth 2 (two) times daily., Disp: 180 tablet, Rfl: 3 .  hydroxychloroquine (PLAQUENIL) 200 MG tablet, TAKE 1 TABLET BY MOUTH TWICE DAILY MONDAY THROUGH FRIDAY, Disp: 40 tablet, Rfl: 2 .  imipramine (TOFRANIL) 25 MG tablet, Take one or two po at bedtime, Disp: 180 tablet, Rfl: 4 .  Insulin Regular Human (HUMULIN R U-500, CONCENTRATED, [place]), Inject 20 Units into the skin 3 (three) times daily as needed., Disp: , Rfl:  .  lidocaine (XYLOCAINE) 5 % ointment, Apply 1 application topically as needed., Disp: 35.44 g, Rfl: 11 .  LORazepam (ATIVAN) 1 MG tablet, as needed., Disp: , Rfl: 5 .  montelukast (SINGULAIR) 10 MG tablet, Take 10 mg by mouth at bedtime., Disp: , Rfl:  .  thyroid (ARMOUR) 240 MG tablet, Take by mouth., Disp: , Rfl:  .  losartan (COZAAR) 50 MG tablet, Take 50 mg by mouth., Disp: , Rfl:   PAST MEDICAL HISTORY: Past Medical History:  Diagnosis Date  . Anxiety   . Arthritis   . 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 Diabetes mellitus    insulin pump.followed dr Peyton Bottoms [facility]  . Difficult intubation    2011 scratched trachea  . Fibromyalgia   . GERD (gastroesophageal reflux disease)   . Headache(784.0)   . Hypothyroidism   . Lumbar disc disease   . Neuropathy   . Sjogren's disease ([place])   . Sleep apnea    no cpap.sleep study 2006 [facility]    PAST SURGICAL HISTORY: Past Surgical History:    Procedure Laterality Date  . APPENDECTOMY    . BACK SURGERY     lumbar fusion  . BRAIN SURGERY    . CESAREAN SECTION    . ENDOMETRIAL ABLATION    . FRACTURE SURGERY     l arm  . LUMBAR LAMINECTOMY/DECOMPRESSION MICRODISCECTOMY  11/13/2011   Procedure: LUMBAR LAMINECTOMY/DECOMPRESSION MICRODISCECTOMY;  Surgeon: Faythe Ghee, MD;  Location: [facility];  Service: Neurosurgery;  Laterality: Bilateral;  Bilateral Lumbar four-five Decompression  . TONSILLECTOMY    . TRANSPHENOIDAL / TRANSNASAL HYPOPHYSECTOMY / RESECTION PITUITARY TUMOR    . TRIGGER FINGER RELEASE    . TUBAL LIGATION      FAMILY HISTORY: Family History  Problem Relation Age of Onset  . Diabetes type II Mother   . Diabetes type II Father   . Heart disease Father   . Heart attack Father   . Colon cancer Maternal Grandmother   . Heart disease Maternal Grandfather     SOCIAL HISTORY:  Social History   Socioeconomic History  . Marital status: Married    Spouse name: Not on file  . Number of children: Not on file  . Years of education: Not on file  . Highest education level: Not on file  Social Needs  . Financial resource strain: Not on file  . Food insecurity - worry: Not on file  . Food insecurity - inability: Not on file  . Transportation needs - medical: Not on file  . Transportation needs - non-medical: Not on file  Occupational History  . Not on file  Tobacco Use  . Smoking status: Former  Smoker    Packs/day: 1.00    Years: 30.00    Pack years: 30.00  . Smokeless tobacco: Never Used  Substance and Sexual Activity  . Alcohol use: Yes    Alcohol/week: 0.6 oz    Types: 1 Glasses of wine per week    Comment: rarley  . Drug use: No  . Sexual activity: Not on file  Other Topics Concern  . Not on file  Social History Narrative  . Not on file     PHYSICAL EXAM  Vitals:   09/10/17 1501  BP: 131/73  Pulse: 80  Weight: 256 lb (116.1 kg)  Height: 5' 3.5" (1.613 m)    Body mass index is  44.64 kg/m.   General: The patient is an obese woman in NAD.   Pharynx is Mallampati 3  Skin: Extremities are without significant edema.  Musculoskeletal:  She has tenderness over many of the classic fibromyalgia tender points of the upper chest and back.     Some enlargement of PIP finger joints (esp right #2)  Neurologic Exam  Mental status: The patient is alert and oriented x 3 at the time of the examination. The patient has apparent normal recent and remote memory, with an apparently normal attention span and concentration ability.   Speech is normal.  Cranial nerves: Extraocular movements are full.   There is good facial sensation to soft touch bilaterally.Facial strength is normal.  Trapezius and sternocleidomastoid strength is normal. No dysarthria is noted.  The tongue is midline, and the patient has symmetric elevation of the soft palate. No obvious hearing deficits are noted.  Motor:  Muscle bulk is normal.   Tone is normal. Strength is  5 / 5 in all 4 extremities.   Sensory: Sensory testing is intact to soft touch and vibration sensation in the arms. She has normal vibration sensation in the toes but mildly reduced touch sensation at the ankles with allodynia on the top of the foot  Coordination: Cerebellar testing reveals good finger-nose-finger and heel-to-shin bilaterally.  Gait and station: Station is normal.   Gait is normal. Tandem gait is slightly wide. Romberg is negative.   Reflexes: Deep tendon reflexes are symmetric and 1+ bilaterally.        DIAGNOSTIC DATA (LABS, IMAGING, TESTING) - I reviewed patient records, labs, notes, testing and imaging myself where available.  Lab Results  Component Value Date   WBC 11.6 (H) 03/14/2017   HGB 12.4 03/14/2017   HCT 39.0 03/14/2017   MCV 83.9 03/14/2017   PLT 270 03/14/2017      Component Value Date/Time   NA 138 03/14/2017 0922   K 4.5 03/14/2017 0922   CL 101 03/14/2017 0922   CO2 22 03/14/2017 0922   GLUCOSE  165 (H) 03/14/2017 0922   BUN 13 03/14/2017 0922   CREATININE 0.62 03/14/2017 0922   CALCIUM 8.6 03/14/2017 0922   PROT 7.2 03/14/2017 0922   ALBUMIN 3.5 (L) 03/14/2017 0922   AST 19 03/14/2017 0922   ALT 15 03/14/2017 0922   ALKPHOS 82 03/14/2017 0922   BILITOT 0.3 03/14/2017 0922   GFRNONAA >89 03/14/2017 0922   GFRAA >89 03/14/2017 0922      ASSESSMENT AND PLAN  Obstructive apnea  Fibromyalgia  Mixed connective tissue disease (HCC)  MGUS (monoclonal gammopathy of unknown significance)  Neuropathy    1.    For her polyneuropathy/restless leg pain, she will continue Horizant and lidocaine ointment.  2.    Continue imipramine for  insomnia and nocturia 3.   He had to cancel her sleep study. She is, however, using her old CPAP machine. She states that her husband reports that she is no longer snoring. We could see if we can get a reading on the efficacy and she can bring the chip or the entire machine in to be evaluated 4.   She is advised to stay active and exercises as tolerated.  5.   RTC 12 months, sooner if she has new or worsening neurologic symptoms.   Sumedha Munnerlyn A. Felecia Shelling, MD, PhD 0/24/0973, 5:32 PM Certified in Neurology, Clinical Neurophysiology, Sleep Medicine, Pain Medicine and Neuroimaging  [facility] [address], [place], [place] [address] [phone]  7

## 2017-09-12 ENCOUNTER — Telehealth: Payer: Self-pay | Admitting: Rheumatology

## 2017-09-12 NOTE — Telephone Encounter (Signed)
Patient left a voicemail stating that she had questions about her labs and requested a return call.

## 2017-09-13 MED ORDER — METHOTREXATE 2.5 MG PO TABS: ORAL_TABLET | ORAL | 2 refills | Status: DC

## 2017-09-13 MED ORDER — FOLIC ACID 1 MG PO TABS: 2.0000 mg | ORAL_TABLET | Freq: Every day | ORAL | 3 refills | Status: DC

## 2017-09-13 NOTE — Telephone Encounter (Signed)
She will be started on methotrexate 4 tablets.  We will check CBC and CMP 2 weeks after starting MTX.  We will call in the prescription for folic acid 2 mg daily and MTX 4 tablets weekly after her lab results come back.  If her labs are stable 2 weeks after starting we will increase her dose to 6 tablets weekly. As labs are stable prescriptions have been sent to the pharmacy.

## 2017-09-16 ENCOUNTER — Other Ambulatory Visit: Payer: Self-pay | Admitting: Rheumatology

## 2017-09-16 NOTE — Telephone Encounter (Signed)
Last visit: 09/02/2017 Next visit: 12/17/2017 Labs: 09/10/2017 elevated glucose, all other labs stable Eye exam: 06/19/2017  Okay to refill per Dr. Estanislado Pandy.

## 2017-09-30 ENCOUNTER — Other Ambulatory Visit: Payer: Self-pay

## 2017-09-30 DIAGNOSIS — Z79899 Other long term (current) drug therapy: Secondary | ICD-10-CM

## 2017-10-01 LAB — COMPLETE METABOLIC PANEL WITH GFR
AG RATIO: 1.1 (calc) (ref 1.0–2.5)
ALT: 20 U/L (ref 6–29)
AST: 17 U/L (ref 10–35)
Albumin: 3.9 g/dL (ref 3.6–5.1)
Alkaline phosphatase (APISO): 77 U/L (ref 33–130)
BUN: 14 mg/dL (ref 7–25)
CALCIUM: 9.7 mg/dL (ref 8.6–10.4)
CO2: 28 mmol/L (ref 20–32)
Chloride: 102 mmol/L (ref 98–110)
Creat: 0.63 mg/dL (ref 0.50–0.99)
GFR, EST NON AFRICAN AMERICAN: 97 mL/min/{1.73_m2} (ref >=60)
GFR, Est African American: 112 mL/min/{1.73_m2} (ref >=60)
Globulin: 3.4 g/dL (calc) (ref 1.9–3.7)
Glucose, Bld: 133 mg/dL — ABNORMAL HIGH (ref 65–99)
POTASSIUM: 4.5 mmol/L (ref 3.5–5.3)
Sodium: 135 mmol/L (ref 135–146)
Total Bilirubin: 0.5 mg/dL (ref 0.2–1.2)
Total Protein: 7.3 g/dL (ref 6.1–8.1)

## 2017-10-01 LAB — CBC WITH DIFFERENTIAL/PLATELET
BASOS ABS: 31 {cells}/uL (ref 0–200)
Basophils Relative: 0.4 %
EOS PCT: 2.1 %
Eosinophils Absolute: 162 cells/uL (ref 15–500)
HCT: 39.4 % (ref 35.0–45.0)
Hemoglobin: 12.9 g/dL (ref 11.7–15.5)
Lymphs Abs: 1394 cells/uL (ref 850–3900)
MCH: 26.7 pg — ABNORMAL LOW (ref 27.0–33.0)
MCHC: 32.7 g/dL (ref 32.0–36.0)
MCV: 81.6 fL (ref 80.0–100.0)
MONOS PCT: 7 %
MPV: 10.1 fL (ref 7.5–12.5)
NEUTROS PCT: 72.4 %
Neutro Abs: 5575 cells/uL (ref 1500–7800)
PLATELETS: 221 10*3/uL (ref 140–400)
RBC: 4.83 10*6/uL (ref 3.80–5.10)
RDW: 16.3 % — AB (ref 11.0–15.0)
TOTAL LYMPHOCYTE: 18.1 %
WBC mixed population: 539 cells/uL (ref 200–950)
WBC: 7.7 10*3/uL (ref 3.8–10.8)

## 2017-10-15 ENCOUNTER — Encounter: Payer: Self-pay | Admitting: Internal Medicine

## 2017-10-15 ENCOUNTER — Ambulatory Visit (INDEPENDENT_AMBULATORY_CARE_PROVIDER_SITE_OTHER): Admitting: Internal Medicine

## 2017-10-15 VITALS — BP 124/60 | HR 84 | Ht 63.5 in | Wt 252.8 lb

## 2017-10-15 DIAGNOSIS — M351 Other overlap syndromes: Secondary | ICD-10-CM | POA: Diagnosis not present

## 2017-10-15 DIAGNOSIS — R0609 Other forms of dyspnea: Secondary | ICD-10-CM

## 2017-10-15 DIAGNOSIS — R0602 Shortness of breath: Secondary | ICD-10-CM | POA: Diagnosis not present

## 2017-10-15 NOTE — Progress Notes (Signed)
   Subjective:    Patient ID: Gloria Porter, female    DOB: March 03, 1956, 62 y.o.   MRN: [id number]  HPI    Review of Systems  Constitutional: Positive for unexpected weight change. Negative for fever.  HENT: Negative for congestion, dental problem, ear pain, nosebleeds, postnasal drip, rhinorrhea, sinus pressure, sneezing, sore throat and trouble swallowing.   Eyes: Positive for itching. Negative for redness.  Respiratory: Positive for cough, chest tightness and shortness of breath. Negative for wheezing.   Cardiovascular: Positive for palpitations. Negative for leg swelling.  Gastrointestinal: Positive for nausea. Negative for vomiting.  Genitourinary: Negative for dysuria.  Musculoskeletal: Negative for joint swelling.  Skin: Negative for rash.  Allergic/Immunologic: Negative.  Negative for environmental allergies, food allergies and immunocompromised state.  Neurological: Negative for headaches.  Hematological: Does not bruise/bleed easily.  Psychiatric/Behavioral: Positive for dysphoric mood. The patient is nervous/anxious.        Objective:   Physical Exam        Assessment & Plan:

## 2017-10-15 NOTE — Patient Instructions (Addendum)
ICD-10-CM   1. Dyspnea on exertion R06.09   2. Mixed connective tissue disease ([place]) M35.1    Plan Do HRCT supine and prone Do PFT test Do ECHO  Followup Finish above next few weeks to several weeks and return to see me to regroup

## 2017-10-15 NOTE — Progress Notes (Signed)
Subjective:     Patient ID: Gloria Porter, female   DOB: 1956-03-24, 62 y.o.   MRN: [id number]  PCP Florestine Avers (Inactive)   HPI   IOV 10/15/2017  Chief Complaint  Patient presents with  . Follow-up    Referred by Dr. Estanislado Pandy due to see if a skin biopsy needs to be done.  Pt does have c/o SOB, occ. cough in the morning from sinus drainage, and occ. chest tightness.    62 year old female with history of mixed connective tissue disease.  History is obtained from talking to her and review of the chart referred by Dr. Keturah Barre.  As best as I can gather she is ANA positive and she is also RNP positive.  I only have the RNP levels with me.   She tells me that in 2010 she started getting symptomatic and then in 2012 was diagnosed with pituitary adenoma and underwent resection.  After that she was on hydrocortisone until 2015.  Sometime around this there was significant weight gain.  She then had low back issues and sciatica and underwent surgery for that in 2015 and since then has been bothered by sciatica but has limited her mobility.  For the last few to several years she has had insidious onset of shortness of breath that has been progressive but recently in the last few years it is been stable.  Activities such as doing her laundry and moving around in the house make her dyspneic.  In fact walking in our office 185 feet x3 laps on room air made her dyspneic.  There is no associated cough.  Mild cough early in the morning with some sinus drainage.  There is no wheezing.  There is no edema orthopnea.  She does have sleep apnea history but she does not use CPAP saying that her sleep doctor has not pushed for it.  Recently since 2000 17/18 she has been bothered by significant arthralgia and edema of her hands.  She saw orthopedics and then she finally saw Dr. D rheumatologist as a second opinion and has been given the diagnosis of mixed connective tissue disease.  She has had refractory pain and in  January 2019 was started on methotrexate which is now started improving her pain .  She is due for upcoming skin biopsy to differentiate between diabetic sclerosis and systemic sclerosis.  Given the presence of mixed connective tissue disease she has been referred to pulmonary for evaluation of associated pulmonary issues.  She is having shortness of breath. Walking desaturation test on 10/15/2017 185 feet x 3 laps on ROOM AIR:  did not desaturate but got dyspneic at very end. Rest pulse ox was 99%, final pulse ox was 97%. HR response 82/min at rest to 93/min at peak exertion. Patient Gloria Porter  Did not Desaturate < 88% . Vivyan N Pepper yes  Desaturated </= 3% points. Gloria Porter yes did get tachyardic   cxr April 2018 in PACS - personally visualized - looks clear   Results for ARYELLE, FIGG (MRN [id number]) as of 10/15/2017 12:15  Ref. Range 03/14/2017 09:22  ds DNA Ab Latest Units: IU/mL <1  ENA SM Ab Ser-aCnc Latest Ref Range: <1.0 NEG AI  <1.0 NEG  C3 Complement Latest Ref Range: 83 - 193 mg/dL 163  C4 Complement Latest Ref Range: 15 - 57 mg/dL 21  Ribonucleic Protein(ENA) Antibody, IgG Latest Ref Range: <1.0 NEG AI  1.8 POS (H)  SSA (Ro) (ENA) Antibody, IgG Latest Ref  Range: <1.0 NEG AI  <1.0 NEG  SSB (La) (ENA) Antibody, IgG Latest Ref Range: <1.0 NEG AI  <1.0 NEG  Scleroderma (Scl-70) (ENA) Antibody, IgG Latest Ref Range: <1.0 NEG AI  <1.0 NEG   TB Gold: negative 04/10/17 Hepatitis panel: Non-reactive 04/16/17  Results for SHERANDA, SEABROOKS (MRN [id number]) as of 10/15/2017 12:15  Ref. Range 09/30/2017 14:36  Eosinophils Absolute Latest Ref Range: 15 - 500 cells/uL 162   Results for REIDA, HEM (MRN [id number]) as of 10/15/2017 12:15  Ref. Range 09/30/2017 14:36  Creatinine Latest Ref Range: 0.50 - 0.99 mg/dL 0.63  Results for SHARLETTE, JANSMA (MRN [id number]) as of 10/15/2017 12:15  Ref. Range 09/30/2017 14:36  Hemoglobin Latest Ref Range: 11.7 - 15.5 g/dL 12.9   Results for  JAMYAH, FOLK (MRN [id number]) as of 10/15/2017 12:15  Ref. 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   reports that she quit smoking about 11 years ago. Her smoking use included cigarettes. She has a 32.00 pack-year smoking history. she has never used smokeless tobacco.  Past Surgical History:  Procedure Laterality Date  . APPENDECTOMY    . BACK SURGERY     lumbar fusion  . BRAIN SURGERY    . CESAREAN SECTION    . ENDOMETRIAL ABLATION    . FRACTURE SURGERY     l arm  . LUMBAR LAMINECTOMY/DECOMPRESSION MICRODISCECTOMY  11/13/2011   Procedure: LUMBAR LAMINECTOMY/DECOMPRESSION MICRODISCECTOMY;  Surgeon: Faythe Ghee, MD;  Location: [place];  Service: Neurosurgery;  Laterality: Bilateral;  Bilateral Lumbar four-five Decompression  . TONSILLECTOMY    . TRANSPHENOIDAL / TRANSNASAL HYPOPHYSECTOMY / RESECTION PITUITARY TUMOR    . TRIGGER FINGER RELEASE    . TUBAL LIGATION      Allergies  Allergen Reactions  . Atorvastatin   . Codeine Nausea And Vomiting  . Quinapril Hcl     Immunization History  Administered Date(s) Administered  . Influenza Split 06/13/2017    Family History  Problem Relation Age of Onset  . Diabetes type II Mother   . Diabetes type II Father   . Heart disease Father   . Heart attack Father   . Colon cancer Maternal Grandmother   . Heart disease Maternal Grandfather      Current Outpatient Medications:  .  ACCU-CHEK FASTCLIX LANCETS MISC, , Disp: , Rfl:  .  APPLE CIDER VINEGAR PO, Take by mouth daily., Disp: , Rfl:  .  buPROPion (WELLBUTRIN SR) 150 MG 12 hr tablet, Take 150 mg by mouth daily., Disp: , Rfl:  .  Calcium  Carbonate-Vitamin D (CALCIUM + D PO), Take 1 tablet by mouth daily., Disp: , Rfl:  .  cetirizine (ZYRTEC) 10 MG tablet, Take 10 mg by mouth., Disp: , Rfl:  .  DEXILANT 60 MG capsule, , Disp: , Rfl:  .  empagliflozin (JARDIANCE) 10 MG TABS tablet, Take 10 mg by mouth., Disp: , Rfl:  .  folic acid (FOLVITE) 1 MG tablet, Take 2 tablets (2 mg total) by mouth daily., Disp: 180 tablet, Rfl: 3 .  FREESTYLE LITE test strip, daily., Disp: , Rfl:  .  furosemide (LASIX) 40 MG tablet, Take 40 mg by mouth daily., Disp: , Rfl:  .  HORIZANT 600 MG  TBCR, Take 600 mg by mouth 2 (two) times daily., Disp: 180 tablet, Rfl: 3 .  hydroxychloroquine (PLAQUENIL) 200 MG tablet, TAKE 1 TABLET BY MOUTH TWICE DAILY MONDAY THROUGH FRIDAY, Disp: 40 tablet, Rfl: 1 .  imipramine (TOFRANIL) 25 MG tablet, Take one or two po at bedtime, Disp: 180 tablet, Rfl: 4 .  Insulin Regular Human (HUMULIN R U-500, CONCENTRATED, [place]), Inject 20 Units into the skin 3 (three) times daily as needed., Disp: , Rfl:  .  lidocaine (XYLOCAINE) 5 % ointment, Apply 1 application topically as needed., Disp: 35.44 g, Rfl: 11 .  LORazepam (ATIVAN) 1 MG tablet, as needed., Disp: , Rfl: 5 .  losartan (COZAAR) 50 MG tablet, , Disp: , Rfl:  .  methotrexate (RHEUMATREX) 2.5 MG tablet, Take 4 tablets by mouth weekly x 2 weeks. If labs are stable increase to 6 tablets weekly Caution:Chemotherapy. Protect from light., Disp: 24 tablet, Rfl: 2 .  montelukast (SINGULAIR) 10 MG tablet, Take 10 mg by mouth at bedtime., Disp: , Rfl:  .  Semaglutide (OZEMPIC) 0.25 or 0.5 MG/DOSE SOPN, Inject into the skin every Saturday., Disp: , Rfl:  .  thyroid (ARMOUR) 240 MG tablet, Take by mouth., Disp: , Rfl:  .  losartan (COZAAR) 50 MG tablet, Take 50 mg by mouth., Disp: , Rfl:     Review of Systems     Objective:   Physical Exam  Constitutional: She is oriented to person, place, and time. She appears well-developed and well-nourished. No distress.  obese  HENT:  Head:  Normocephalic and atraumatic.  Right Ear: External ear normal.  Left Ear: External ear normal.  Mouth/Throat: Oropharynx is clear and moist. No oropharyngeal exudate.  Eyes: Conjunctivae and EOM are normal. Pupils are equal, round, and reactive to light. Right eye exhibits no discharge. Left eye exhibits no discharge. No scleral icterus.  Neck: Normal range of motion. Neck supple. No JVD present. No tracheal deviation present. No thyromegaly present.  Cardiovascular: Normal rate, regular rhythm, normal heart sounds and intact distal pulses. Exam reveals no gallop and no friction rub.  No murmur heard. Pulmonary/Chest: Effort normal and breath sounds normal. No respiratory distress. She has no wheezes. She has no rales. She exhibits no tenderness.  No crackles No loud P2  Abdominal: Soft. Bowel sounds are normal. She exhibits no distension and no mass. There is no tenderness. There is no rebound and no guarding.  Musculoskeletal: Normal range of motion. She exhibits no edema or tenderness.  Lymphadenopathy:    She has no cervical adenopathy.  Neurological: She is alert and oriented to person, place, and time. She has normal reflexes. No cranial nerve deficit. She exhibits normal muscle tone. Coordination normal.  Skin: Skin is warm and dry. No rash noted. She is not diaphoretic. No erythema. No pallor.  Psychiatric: Her behavior is normal. Judgment and thought content normal.  Mild flat affect  Vitals reviewed.  Vitals:   10/15/17 1213  BP: 124/60  Pulse: 84  SpO2: 96%  Weight: 252 lb 12.8 oz (114.7 kg)  Height: 5' 3.5" (1.613 m)    Estimated body mass index is 44.08 kg/m as calculated from the following:   Height as of this encounter: 5' 3.5" (1.613 m).   Weight as of this encounter: 252 lb 12.8 oz (114.7 kg).      Assessment:       ICD-10-CM   1. Dyspnea on exertion R06.09 CT Chest High Resolution    Pulmonary function test  2. Mixed  connective tissue disease ([place]) M35.1 CT  Chest High Resolution    Pulmonary function test  3. Shortness of breath R06.02    Need to rule out ILD and pulm htn that are higher proevalance in this disease population though based on exam my suspicion is low for these. If investigations rule these out then look at more common reasons for dyspnea that are obesity, diast dysfunction and deconditioning     Plan:      Plan Do HRCT supine and prone Do PFT test Do ECHO  Followup Finish above next few weeks to several weeks and return to see me to regroup   Dr. Brand Males, M.D., [facility].C.P Pulmonary and Critical Care Medicine Staff Physician, [place] Director - Interstitial Lung Disease  Program  Pulmonary [place] at [place], [place], [address]  Pager: [phone], If no answer or between  15:00h - 7:00h: call [phone] Telephone: [phone]

## 2017-10-22 ENCOUNTER — Ambulatory Visit (HOSPITAL_COMMUNITY): Attending: Cardiology

## 2017-10-22 ENCOUNTER — Ambulatory Visit (INDEPENDENT_AMBULATORY_CARE_PROVIDER_SITE_OTHER)
Admission: RE | Admit: 2017-10-22 | Discharge: 2017-10-22 | Disposition: A | Discharge status: Home / Self Care | Source: Ambulatory Visit | Attending: Internal Medicine | Admitting: Internal Medicine

## 2017-10-22 ENCOUNTER — Other Ambulatory Visit: Payer: Self-pay

## 2017-10-22 DIAGNOSIS — G4733 Obstructive sleep apnea (adult) (pediatric): Secondary | ICD-10-CM | POA: Diagnosis not present

## 2017-10-22 DIAGNOSIS — R0609 Other forms of dyspnea: Secondary | ICD-10-CM | POA: Diagnosis not present

## 2017-10-22 DIAGNOSIS — M351 Other overlap syndromes: Secondary | ICD-10-CM

## 2017-10-22 DIAGNOSIS — I119 Hypertensive heart disease without heart failure: Secondary | ICD-10-CM | POA: Insufficient documentation

## 2017-10-22 DIAGNOSIS — Z87891 Personal history of nicotine dependence: Secondary | ICD-10-CM | POA: Insufficient documentation

## 2017-10-22 DIAGNOSIS — Z6841 Body Mass Index (BMI) 40.0 and over, adult: Secondary | ICD-10-CM | POA: Insufficient documentation

## 2017-10-22 DIAGNOSIS — E119 Type 2 diabetes mellitus without complications: Secondary | ICD-10-CM | POA: Insufficient documentation

## 2017-10-22 DIAGNOSIS — E669 Obesity, unspecified: Secondary | ICD-10-CM | POA: Insufficient documentation

## 2017-10-22 MED ORDER — PERFLUTREN LIPID MICROSPHERE
1.0000 mL | INTRAVENOUS | Status: AC | PRN
  Administered 2017-10-22: 2 mL via INTRAVENOUS

## 2017-10-24 ENCOUNTER — Ambulatory Visit (INDEPENDENT_AMBULATORY_CARE_PROVIDER_SITE_OTHER): Admitting: Internal Medicine

## 2017-10-24 DIAGNOSIS — R0609 Other forms of dyspnea: Secondary | ICD-10-CM

## 2017-10-24 DIAGNOSIS — M351 Other overlap syndromes: Secondary | ICD-10-CM

## 2017-10-24 LAB — PULMONARY FUNCTION TEST
DL/VA % PRED: 116 %
DL/VA: 5.53 ml/min/mmHg/L
DLCO UNC % PRED: 91 %
DLCO UNC: 21.64 ml/min/mmHg
DLCO cor % pred: 93 %
DLCO cor: 21.99 ml/min/mmHg
FEF 25-75 PRE: 2.56 L/s
FEF 25-75 Post: 2.73 L/sec
FEF2575-%CHANGE-POST: 6 %
FEF2575-%PRED-PRE: 112 %
FEF2575-%Pred-Post: 120 %
FEV1-%Change-Post: 1 %
FEV1-%PRED-PRE: 84 %
FEV1-%Pred-Post: 85 %
FEV1-POST: 2.11 L
FEV1-PRE: 2.08 L
FEV1FVC-%Change-Post: 1 %
FEV1FVC-%Pred-Pre: 108 %
FEV6-%CHANGE-POST: 0 %
FEV6-%PRED-POST: 79 %
FEV6-%Pred-Pre: 79 %
FEV6-POST: 2.46 L
FEV6-Pre: 2.46 L
FEV6FVC-%PRED-POST: 104 %
FEV6FVC-%PRED-PRE: 104 %
FVC-%Change-Post: 0 %
FVC-%Pred-Post: 76 %
FVC-%Pred-Pre: 76 %
FVC-Post: 2.47 L
FVC-Pre: 2.46 L
POST FEV6/FVC RATIO: 100 %
Post FEV1/FVC ratio: 85 %
Pre FEV1/FVC ratio: 85 %
Pre FEV6/FVC Ratio: 100 %
RV % pred: 76 %
RV: 1.53 L
TLC % PRED: 83 %
TLC: 4.16 L

## 2017-10-24 NOTE — Progress Notes (Signed)
PFT done today. 

## 2017-11-05 ENCOUNTER — Telehealth: Payer: Self-pay | Admitting: Internal Medicine

## 2017-11-05 ENCOUNTER — Encounter: Payer: Self-pay | Admitting: Internal Medicine

## 2017-11-05 ENCOUNTER — Ambulatory Visit (INDEPENDENT_AMBULATORY_CARE_PROVIDER_SITE_OTHER): Admitting: Internal Medicine

## 2017-11-05 VITALS — BP 122/68 | HR 82 | Ht 63.5 in | Wt 259.0 lb

## 2017-11-05 DIAGNOSIS — I2510 Atherosclerotic heart disease of native coronary artery without angina pectoris: Secondary | ICD-10-CM

## 2017-11-05 DIAGNOSIS — R0609 Other forms of dyspnea: Secondary | ICD-10-CM

## 2017-11-05 DIAGNOSIS — M351 Other overlap syndromes: Secondary | ICD-10-CM

## 2017-11-05 DIAGNOSIS — R0982 Postnasal drip: Secondary | ICD-10-CM

## 2017-11-05 DIAGNOSIS — I2584 Coronary atherosclerosis due to calcified coronary lesion: Secondary | ICD-10-CM | POA: Diagnosis not present

## 2017-11-05 MED ORDER — FLUTICASONE PROPIONATE 50 MCG/ACT NA SUSP: 2.0000 | Freq: Every day | NASAL | 2 refills | Status: DC

## 2017-11-05 NOTE — Telephone Encounter (Signed)
Gloria Porter is your patient. [place] if you think she needs stress test. I did CT chest - and she has coronary artery calcificaitopn. No chest pain. Please let her know directly  Thanks  Dr. Brand Males, M.D., [facility].C.P Pulmonary and Critical Care Medicine Staff Physician, [place] Director - Interstitial Lung Disease  Program  Pulmonary [place] at [place], [place], [address]  Pager: [phone], If no answer or between  15:00h - 7:00h: call [phone] Telephone: [phone]

## 2017-11-05 NOTE — Patient Instructions (Addendum)
Dyspnea on exertion Mixed connective tissue disease (HCC)   - no evidence of pulmonary fibrosis or pulmonary hypertension though you are at risk; this is good news - curently shortness of breath due to weight and stif heart muscle - we can refer  To pulmonary rehab if you want  -but we need to keep an eye with annual followup - do PFT in 1 year  Coronary artery calcification - wil write to [place] to see if he think you need stress test  Post-nasal drip -  take generic fluticasone inhaler 2 squirts each nostril daily   followup PFT in 1 year ROV Dr Chase Caller in 1 yuear

## 2017-11-05 NOTE — Progress Notes (Signed)
Subjective:     Patient ID: Gloria Porter, female   DOB: 01-23-1956, 62 y.o.   MRN: [id number]  HPI    HPI   IOV 10/15/2017  Chief Complaint  Patient presents with  . Follow-up    Referred by Dr. Estanislado Pandy due to see if a skin biopsy needs to be done.  Pt does have c/o SOB, occ. cough in the morning from sinus drainage, and occ. chest tightness.    62 year old female with history of mixed connective tissue disease.  History is obtained from talking to her and review of the chart referred by Dr. Keturah Barre.  As best as I can gather she is ANA positive and she is also RNP positive.  I only have the RNP levels with me.   She tells me that in 2010 she started getting symptomatic and then in 2012 was diagnosed with pituitary adenoma and underwent resection.  After that she was on hydrocortisone until 2015.  Sometime around this there was significant weight gain.  She then had low back issues and sciatica and underwent surgery for that in 2015 and since then has been bothered by sciatica but has limited her mobility.  For the last few to several years she has had insidious onset of shortness of breath that has been progressive but recently in the last few years it is been stable.  Activities such as doing her laundry and moving around in the house make her dyspneic.  In fact walking in our office 185 feet x3 laps on room air made her dyspneic.  gh.  Mild cough early in the morning with some sinus drainage.  There is no wheezing.  There is no edema orthopnea.  She does have sleep apnea history but she does not use CPAP saying that her sleep doctor has not pushed for it.  Recently since 2000 17/18 she has been bothered by significant arthralgia and edema of her hands.  She saw orthopedics and then she finally saw Dr. D rheumatologist as a second opinion and has been given the diagnosis of mixed connective tissue disease.  She has had refractory pain and in January 2019 was started on methotrexate which is now  started improving her pain .  She is due for upcoming skin biopsy to differentiate between diabetic sclerosis and systemic sclerosis.  Given the presence of mixed connective tissue disease she has been referred to pulmonary for evaluation of associated pulmonary issues.  She is having shortness of breath. Walking desaturation test on 10/15/2017 185 feet x 3 laps on ROOM AIR:  did not desaturate but got dyspneic at very end. Rest pulse ox was 99%, final pulse ox was 97%. HR response 82/min at rest to 93/min at peak exertion. Patient Gloria Porter  Did not Desaturate < 88% . Ezell N Schmall yes  Desaturated </= 3% points. Gloria Porter yes did get tachyardic   cxr April 2018 in PACS - personally visualized - looks clear      Results for VEEDA, VIRGO (MRN [id number]) as of 10/15/2017 12:15  Ref. Range 03/14/2017 09:22  ds DNA Ab Latest Units: IU/mL <1  ENA SM Ab Ser-aCnc Latest Ref Range: <1.0 NEG AI  <1.0 NEG  C3 Complement Latest Ref Range: 83 - 193 mg/dL 163  C4 Complement Latest Ref Range: 15 - 57 mg/dL 21  Ribonucleic Protein(ENA) Antibody, IgG Latest Ref Range: <1.0 NEG AI  1.8 POS (H)  SSA (Ro) (ENA) Antibody, IgG Latest Ref Range: <1.0 NEG  AI  <1.0 NEG  SSB (La) (ENA) Antibody, IgG Latest Ref Range: <1.0 NEG AI  <1.0 NEG  Scleroderma (Scl-70) (ENA) Antibody, IgG Latest Ref Range: <1.0 NEG AI  <1.0 NEG   TB Gold: negative 04/10/17 Hepatitis panel: Non-reactive 04/16/17  Results for TYISHA, CRESSY (MRN [id number]) as of 10/15/2017 12:15  Ref. Range 09/30/2017 14:36  Eosinophils Absolute Latest Ref Range: 15 - 500 cells/uL 162   Results for TANISHA, LUTES (MRN [id number]) as of 10/15/2017 12:15  Ref. Range 09/30/2017 14:36  Creatinine Latest Ref Range: 0.50 - 0.99 mg/dL 0.63  Results for NOVALYNN, BRANAMAN (MRN [id number]) as of 10/15/2017 12:15  Ref. Range 09/30/2017 14:36  Hemoglobin Latest Ref Range: 11.7 - 15.5 g/dL 12.9   Results for SHATERICA, MCCLATCHY (MRN [id number]) as of 10/15/2017  12:15  Ref. Range 09/30/2017 14:36  Eosinophils Absolute Latest Ref Range: 15 - 500 cells/uL 162     OV 11/05/2017   Chief Complaint  Patient presents with  . Follow-up    PFT 10/24/17. Pt states she is still having problems with SOB that is no different from last visit and has a mild cough in the mornings due to sinus drainage. Denies any CP.    FU to discuss test results  1. REalized she has chronic posta nasal drip with AM cough despite antihistamine and singulair  2. TEsts show restriction due to obesity (normal DLC). HRCT - nol ILD. Echo with diast dysfunctio only. Normal RV    Results for NATASCHA, EDMONDS (MRN [id number]) as of 11/05/2017 12:29  Ref. Range 10/24/2017 15:31  FVC-Pre Latest Units: L 2.46  FVC-%Pred-Pre Latest Units: % 76  FEV1-Pre Latest Units: L 2.08  FEV1-%Pred-Pre Latest Units: % 84  Pre FEV1/FVC ratio Latest Units: % 85   Results for CAMILLIA, MARCY (MRN [id number]) as of 11/05/2017 12:29  Ref. Range 10/24/2017 15:31  DLCO cor Latest Units: ml/min/mmHg 21.99  DLCO cor % pred Latest Units: % 93     ECHO 10/22/17  =- ef 65%, Gr 1 diast dysfn, Normal RV     HRCT Chest 0 10/22/17 Mediastinum/Nodes: Mediastinal and axillary lymph nodes are not enlarged by CT size criteria. Hilar regions are difficult to definitively evaluate without IV contrast but appear grossly unremarkable. Esophagus is grossly unremarkable.  Lungs/Pleura: Mild scattered pulmonary parenchymal scarring. Negative for subpleural reticulation, traction bronchiectasis/bronchiolectasis, ground-glass, architectural distortion or honeycombing. Scattered calcified granulomas. Subpleural nodules in the lower lobes measure 6 mm or less in size, as on 05/12/2013, indicative of benign lesions such as subpleural lymph nodes. No air trapping. Inspiratory prone imaging is unremarkable. No pleural fluid. Airway is unremarkable.  IMPRESSION: 1. No evidence of interstitial lung disease. No  pulmonary parenchymal findings to explain the patient's shortness of breath. 2. Aortic atherosclerosis (ICD10-170.0). Coronary artery calcification. 3. Liver margin may be irregular raising suspicion for cirrhosis. 4. Cholelithiasis.   Electronically Signed   By: Lorin Picket M.D.   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   reports that she quit smoking about 11 years ago. Her smoking use included cigarettes. She has a 32.00 pack-year smoking history. She has never used smokeless tobacco.  Past Surgical History:  Procedure Laterality Date  . APPENDECTOMY    . BACK SURGERY     lumbar fusion  .  BRAIN SURGERY    . CESAREAN SECTION    . ENDOMETRIAL ABLATION    . FRACTURE SURGERY     l arm  . LUMBAR LAMINECTOMY/DECOMPRESSION MICRODISCECTOMY  11/13/2011   Procedure: LUMBAR LAMINECTOMY/DECOMPRESSION MICRODISCECTOMY;  Surgeon: Faythe Ghee, MD;  Location: [place] [facility];  Service: Neurosurgery;  Laterality: Bilateral;  Bilateral Lumbar four-five Decompression  . TONSILLECTOMY    . TRANSPHENOIDAL / TRANSNASAL HYPOPHYSECTOMY / RESECTION PITUITARY TUMOR    . TRIGGER FINGER RELEASE    . TUBAL LIGATION      Allergies  Allergen Reactions  . Atorvastatin   . Codeine Nausea And Vomiting  . Quinapril Hcl     Immunization History  Administered Date(s) Administered  . Influenza Split 06/13/2017    Family History  Problem Relation Age of Onset  . Diabetes type II Mother   . Diabetes type II Father   . Heart disease Father   . Heart attack Father   . Colon cancer Maternal Grandmother   . Heart disease Maternal Grandfather      Current Outpatient Medications:  .  ACCU-CHEK FASTCLIX LANCETS MISC, , Disp: , Rfl:  .   APPLE CIDER VINEGAR PO, Take by mouth daily., Disp: , Rfl:  .  buPROPion (WELLBUTRIN SR) 150 MG 12 hr tablet, Take 150 mg by mouth daily., Disp: , Rfl:  .  Calcium Carbonate-Vitamin D (CALCIUM + D PO), Take 1 tablet by mouth daily., Disp: , Rfl:  .  cetirizine (ZYRTEC) 10 MG tablet, Take 10 mg by mouth., Disp: , Rfl:  .  DEXILANT 60 MG capsule, , Disp: , Rfl:  .  empagliflozin (JARDIANCE) 10 MG TABS tablet, Take 10 mg by mouth., Disp: , Rfl:  .  folic acid (FOLVITE) 1 MG tablet, Take 2 tablets (2 mg total) by mouth daily., Disp: 180 tablet, Rfl: 3 .  FREESTYLE LITE test strip, daily., Disp: , Rfl:  .  furosemide (LASIX) 40 MG tablet, Take 40 mg by mouth daily., Disp: , Rfl:  .  HORIZANT 600 MG TBCR, Take 600 mg by mouth 2 (two) times daily., Disp: 180 tablet, Rfl: 3 .  hydroxychloroquine (PLAQUENIL) 200 MG tablet, TAKE 1 TABLET BY MOUTH TWICE DAILY MONDAY THROUGH FRIDAY, Disp: 40 tablet, Rfl: 1 .  imipramine (TOFRANIL) 25 MG tablet, Take one or two po at bedtime, Disp: 180 tablet, Rfl: 4 .  Insulin Regular Human (HUMULIN R U-500, CONCENTRATED, [place]), Inject 20 Units into the skin 3 (three) times daily as needed., Disp: , Rfl:  .  lidocaine (XYLOCAINE) 5 % ointment, Apply 1 application topically as needed., Disp: 35.44 g, Rfl: 11 .  LORazepam (ATIVAN) 1 MG tablet, as needed., Disp: , Rfl: 5 .  losartan (COZAAR) 50 MG tablet, , Disp: , Rfl:  .  methotrexate (RHEUMATREX) 2.5 MG tablet, Take 4 tablets by mouth weekly x 2 weeks. If labs are stable increase to 6 tablets weekly Caution:Chemotherapy. Protect from light., Disp: 24 tablet, Rfl: 2 .  montelukast (SINGULAIR) 10 MG tablet, Take 10 mg by mouth at bedtime., Disp: , Rfl:  .  Semaglutide (OZEMPIC) 0.25 or 0.5 MG/DOSE SOPN, Inject into the skin every Saturday., Disp: , Rfl:  .  thyroid (ARMOUR) 240 MG tablet, Take by mouth., Disp: , Rfl:  .  losartan (COZAAR) 50 MG tablet, Take 50 mg by mouth., Disp: , Rfl:    Review of Systems     Objective:    Physical Exam Vitals:   11/05/17 1159  BP: 122/68  Pulse: 82  SpO2: 99%  Weight: 259 lb (117.5 kg)  Height: 5' 3.5" (1.613 m)    Estimated body mass index is 45.16 kg/m as calculated from the following:   Height as of this encounter: 5' 3.5" (1.613 m).   Weight as of this encounter: 259 lb (117.5 kg).      Assessment:       ICD-10-CM   1. Dyspnea on exertion R06.09   2. Mixed connective tissue disease ([place]) M35.1   3. Coronary artery calcification I25.10    I25.84   4. Post-nasal drip R09.82        Plan:     Dyspnea on exertion Mixed connective tissue disease (HCC)   - no evidence of pulmonary fibrosis or pulmonary hypertension though you are at risk; this is good news - curently shortness of breath due to weight and stif heart muscle - we can refer  To pulmonary rehab if you want  -but we need to keep an eye with annual followup - do PFT in 1 year  Coronary artery calcification - wil write to [place] to see if he think you need stress test  Post-nasal drip -  take generic fluticasone inhaler 2 squirts each nostril daily   followup PFT in 1 year ROV Dr Chase Caller in 1 yuear   (> 50% of this 15 min visit spent in face to face counseling or/and coordination of care)   Dr. Brand Males, M.D., [facility].C.P Pulmonary and Critical Care Medicine Staff Physician, [place] Director - Interstitial Lung Disease  Program  Pulmonary [place] at [place], [place], [address]  Pager: [phone], If no answer or between  15:00h - 7:00h: call [phone] Telephone: [phone]

## 2017-11-21 NOTE — Telephone Encounter (Signed)
Called patient and made her aware that Dr. Chase Caller let Dr. Irish Lack know that her chest CT showed that she has coronary artery calcification and wanted to see if Dr. Irish Lack felt like she needed any further testing. Patient states that she is not having any chest discomfort or any other symptoms at this time. Made patient aware that since she is not having any chest pain there is no additional testing needed at this time. Instructed patient to let us know if she develops any symptoms. Patient verbalized understanding and thanked me for the call.

## 2017-11-21 NOTE — Telephone Encounter (Signed)
Would not pursue stress test if she is not having any chest discomfort.  Please let her know to inform us if she has any symptoms.

## 2017-12-03 ENCOUNTER — Other Ambulatory Visit: Payer: Self-pay | Admitting: Rheumatology

## 2017-12-03 NOTE — Progress Notes (Signed)
Office Visit Note  Patient: Gloria Porter             Date of Birth: [birth year]           MRN: [id number]             PCP: Florestine Avers (Inactive) Referring: No ref. provider found Visit Date: 12/17/2017 Occupation: @GUAROCC @    Subjective:  Pain in hands.  History of Present Illness: Gloria Porter is a 62 y.o. female with history of mixed connective tissue disease, Sjogren's and osteoarthritis.  She states she continues to have tightness in her hands.  She was referred by her dermatologist to [facility].  She states she has not had a skin biopsy yet.  She has been taking methotrexate Plaquenil and folic acid on a regular basis.  She continues to have sicca symptoms.  She has some discomfort in her right knee joint.  She states she has been using diclofenac which is been helpful.  She does have some generalized discomfort from fibromyalgia.  Activities of Daily Living:  Patient reports morning stiffness for all day hours.   Patient Denies nocturnal pain.  Difficulty dressing/grooming: Denies Difficulty climbing stairs: Reports Difficulty getting out of chair: Reports Difficulty using hands for taps, buttons, cutlery, and/or writing: Reports   Review of Systems  Constitutional: Positive for fatigue. Negative for night sweats, weight gain and weight loss.  HENT: Positive for mouth dryness. Negative for mouth sores, trouble swallowing, trouble swallowing and nose dryness.   Eyes: Positive for dryness. Negative for pain, redness and visual disturbance.  Respiratory: Positive for shortness of breath. Negative for cough and difficulty breathing.        With exertion  Cardiovascular: Negative for chest pain, palpitations, hypertension, irregular heartbeat and swelling in legs/feet.  Gastrointestinal: Positive for constipation. Negative for blood in stool and diarrhea.  Endocrine: Negative for increased urination.  Genitourinary: Negative for vaginal dryness.    Musculoskeletal: Positive for arthralgias, joint pain, myalgias, morning stiffness and myalgias. Negative for joint swelling, muscle weakness and muscle tenderness.  Skin: Positive for skin tightness. Negative for color change, rash, hair loss, ulcers and sensitivity to sunlight.  Allergic/Immunologic: Negative for susceptible to infections.  Neurological: Negative for dizziness, memory loss, night sweats and weakness.  Hematological: Negative for swollen glands.  Psychiatric/Behavioral: Positive for depressed mood and sleep disturbance. The patient is nervous/anxious.     PMFS History:  Patient Active Problem List   Diagnosis Date Noted  . Primary osteoarthritis of both hands 04/10/2017  . Primary osteoarthritis of both knees 04/10/2017  . DDD (degenerative disc disease), lumbar 04/10/2017  . Mixed connective tissue disease ([place]) 04/10/2017  . S/P trigger finger release bilateral third 03/14/2017  . MGUS (monoclonal gammopathy of unknown significance) 03/11/2017  . History of chronic kidney disease 03/11/2017  . Monoclonal paraproteinemia 01/11/2016  . Restless leg 01/11/2015  . Carpal tunnel syndrome 12/28/2014  . Dysphagia, oropharyngeal 12/28/2014  . Anxiety, generalized 10/14/2014  . Generalized joint pain 08/17/2014  . Encounter for therapeutic drug monitoring 07/13/2014  . Failed back syndrome of lumbar spine 07/13/2014  . Encounter for therapeutic drug level monitoring 07/13/2014  . 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 Diabetes ([place]) 06/16/2014  . Fibrositis 06/16/2014  . Cannot sleep 06/16/2014  . Extreme obesity 06/16/2014  . Neuropathy 06/16/2014  . Allergic rhinitis, seasonal 06/16/2014  . Post menopausal syndrome 06/16/2014  . 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 Spinal stenosis 06/16/2014  . Adult hypothyroidism 06/16/2014  .  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 Diabetes mellitus ([place]) 06/16/2014  . Fibromyalgia 06/16/2014  . Morbid  obesity ([place]) 06/16/2014  . Sjogren's syndrome ([place]) 06/16/2014  . Breath shortness 01/21/2014  . Essential (primary) hypertension 01/21/2014  . Acid reflux 01/21/2014  . Adiposity 01/21/2014  . Clinical depression 08/26/2013  . Obstructive apnea 01/17/2013  . Cushing's syndrome ([place]) 05/23/2011  . Pituitary adenoma ([place]) 05/23/2011    Past Medical History:  Diagnosis Date  . Anxiety   . Arthritis   . Cushing's disease ([place])    pituitary tumor removed 2012  . Diabetes mellitus    insulin pump.followed dr Peyton Bottoms [facility]  . Difficult intubation    2011 scratched trachea  . Fibromyalgia   . GERD (gastroesophageal reflux disease)   . Headache(784.0)   . Hypothyroidism   . Lumbar disc disease   . Neuropathy   . Sjogren's disease ([place])   . Sleep apnea    no cpap.sleep study 2006 [place]    Family History  Problem Relation Age of Onset  . Diabetes type II Mother   . Diabetes type II Father   . Heart disease Father   . Heart attack Father   . Colon cancer Maternal Grandmother   . Heart disease Maternal Grandfather    Past Surgical History:  Procedure Laterality Date  . APPENDECTOMY    . BACK SURGERY     lumbar fusion  . BRAIN SURGERY    . CESAREAN SECTION    . ENDOMETRIAL ABLATION    . FRACTURE SURGERY     l arm  . LUMBAR LAMINECTOMY/DECOMPRESSION MICRODISCECTOMY  11/13/2011   Procedure: LUMBAR LAMINECTOMY/DECOMPRESSION MICRODISCECTOMY;  Surgeon: Faythe Ghee, MD;  Location: [facility];  Service: Neurosurgery;  Laterality: Bilateral;  Bilateral Lumbar four-five Decompression  . TONSILLECTOMY    . TRANSPHENOIDAL / TRANSNASAL HYPOPHYSECTOMY / RESECTION PITUITARY TUMOR    . TRIGGER FINGER RELEASE    . TUBAL LIGATION     Social History   Social History Narrative  . Not on file     Objective: Vital Signs: BP 130/76 (BP Location: Left Arm, Patient Position: Sitting, Cuff Size: Large)   Pulse 88   Resp 16   Ht 5\' 3"  (1.6 m)   Wt 266 lb  (120.7 kg)   LMP 11/08/2006   BMI 47.12 kg/m    Physical Exam  Constitutional: She is oriented to person, place, and time. She appears well-developed and well-nourished.  HENT:  Head: Normocephalic and atraumatic.  Eyes: Conjunctivae and EOM are normal.  Neck: Normal range of motion.  Cardiovascular: Normal rate, regular rhythm, normal heart sounds and intact distal pulses.  Pulmonary/Chest: Effort normal and breath sounds normal.  Abdominal: Soft. Bowel sounds are normal.  Lymphadenopathy:    She has no cervical adenopathy.  Neurological: She is alert and oriented to person, place, and time.  Skin: Skin is warm and dry. Capillary refill takes less than 2 seconds.  skin thickening was noted on both upper and lower extremities.  She does not have much wrinkles on her face.  I do not see many Telengectesia's.  No nailbed capillary changes were noted.  Psychiatric: She has a normal mood and affect. Her behavior is normal.  Nursing note and vitals reviewed.    Musculoskeletal Exam: C-spine thoracic lumbar spine limited range of motion.  Shoulder joints elbow joints wrist joints are good range of motion.  She is incomplete fist formation due to tightness in her hands.  She also  has some osteoarthritic changes in her hands.  Hip joints knee joints ankles were in good range of motion without synovitis.  She is some generalized hyperalgesia.  CDAI Exam: No CDAI exam completed.    Investigation: No additional findings.PLQ eye exam: 06/19/2017 CBC Latest Ref Rng & Units 09/30/2017 09/10/2017 03/14/2017  WBC 3.8 - 10.8 Thousand/uL 7.7 6.8 11.6(H)  Hemoglobin 11.7 - 15.5 g/dL 12.9 13.1 12.4  Hematocrit 35.0 - 45.0 % 39.4 40.9 39.0  Platelets 140 - 400 Thousand/uL 221 206 270   CMP Latest Ref Rng & Units 09/30/2017 09/10/2017 03/14/2017  Glucose 65 - 99 mg/dL 133(H) 225(H) 165(H)  BUN 7 - 25 mg/dL 14 17 13   Creatinine 0.50 - 0.99 mg/dL 0.63 0.75 0.62  Sodium 135 - 146 mmol/L 135 135 138   Potassium 3.5 - 5.3 mmol/L 4.5 4.2 4.5  Chloride 98 - 110 mmol/L 102 103 101  CO2 20 - 32 mmol/L 28 25 22   Calcium 8.6 - 10.4 mg/dL 9.7 9.3 8.6  Total Protein 6.1 - 8.1 g/dL 7.3 7.7 7.2  Total Bilirubin 0.2 - 1.2 mg/dL 0.5 0.4 0.3  Alkaline Phos 33 - 130 U/L - - 82  AST 10 - 35 U/L 17 19 19   ALT 6 - 29 U/L 20 17 15     Imaging: No results found.  Speciality Comments: PLQ eye exam: 06/19/2017 Normal. [facility]. Follow up in 6 months.    Procedures:  No procedures performed Allergies: Atorvastatin; Codeine; and Quinapril hcl   Assessment / Plan:     Visit Diagnoses: Mixed connective tissue disease ([place]) - ANA 1:160 NS, RNP positive, inflammatory arthritis.Synovitis and tenosynovitis on ultrasound of hands on 08/21/17.  Concerned that she may have a component of scleroderma due to tightness in the skin in upper and lower extremities.  She is also lost wrinkles on her face.- Plan: Urinalysis, Routine w reflex microscopic.  I discussed her diagnosis at length with her.  She would prefer a second opinion.  I will make a referral to [facility] rheumatology.  High risk medication use - MTX 6 tabs po qwk, folic acid 2mg  po qd, PLQ 200mg  po bid. eye exam: 06/19/2017 - Plan: CBC with Differential/Platelet, COMPLETE METABOLIC PANEL WITH GFR  Skin thickening -  A referral was made to dermatology to perform a skin biopsy to differentiate between scleroderma vs. Diabetic sclerosis.  Patient has not had a skin biopsy it.  Sjogren's syndrome with keratoconjunctivitis sicca (HCC)-she continues to have sicca symptoms.  Over-the-counter products were discussed.    Primary osteoarthritis of both hands-joint protection muscle strengthening was discussed.  Primary osteoarthritis of both knees-  DDD (degenerative disc disease), lumbar - s/p laminectomy-she has limited range of motion of her lumbar spine.  Fibromyalgia-she continues to have some generalized pain and discomfort.  History of chronic pain  -   She goes to a pain clinic.   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  History of hypothyroidism - Followed by endocrinologist   History of chronic kidney disease  History of diabetes mellitus  History of depression  History of anxiety    Orders: Orders Placed This Encounter  Procedures  . CBC with Differential/Platelet  . COMPLETE METABOLIC PANEL WITH GFR  . Urinalysis, Routine w reflex microscopic  . Ambulatory referral to Rheumatology   Meds ordered this  encounter  Medications  . methotrexate (RHEUMATREX) 2.5 MG tablet    Sig: Take 6 tablets once weekly Caution:Chemotherapy. Protect from light.    Dispense:  24 tablet    Refill:  2    Face-to-face time spent with patient was 30 minutes.  .50% of time was spent in counseling and coordination of care.  Follow-Up Instructions: Return in about 4 months (around 04/19/2018) for MCTD.   Bo Merino, MD  Note - This record has been created using Editor, commissioning.  Chart creation errors have been sought, but may not always  have been located. Such creation errors do not reflect on  the standard of medical care.

## 2017-12-04 NOTE — Telephone Encounter (Signed)
Last visit: 09/02/2017 Next visit: 12/17/2017 Labs: 09/10/2017 elevated glucose, all other labs stable Eye exam: 06/19/2017  Okay to refill per Dr. Estanislado Pandy.

## 2017-12-17 ENCOUNTER — Encounter: Payer: Self-pay | Admitting: Rheumatology

## 2017-12-17 ENCOUNTER — Ambulatory Visit (INDEPENDENT_AMBULATORY_CARE_PROVIDER_SITE_OTHER): Admitting: Rheumatology

## 2017-12-17 VITALS — BP 130/76 | HR 88 | Resp 16 | Ht 63.0 in | Wt 266.0 lb

## 2017-12-17 DIAGNOSIS — R234 Changes in skin texture: Secondary | ICD-10-CM | POA: Diagnosis not present

## 2017-12-17 DIAGNOSIS — M3501 Sicca syndrome with keratoconjunctivitis: Secondary | ICD-10-CM

## 2017-12-17 DIAGNOSIS — Z79899 Other long term (current) drug therapy: Secondary | ICD-10-CM

## 2017-12-17 DIAGNOSIS — M797 Fibromyalgia: Secondary | ICD-10-CM | POA: Diagnosis not present

## 2017-12-17 DIAGNOSIS — M19041 Primary osteoarthritis, right hand: Secondary | ICD-10-CM | POA: Diagnosis not present

## 2017-12-17 DIAGNOSIS — D472 Monoclonal gammopathy: Secondary | ICD-10-CM

## 2017-12-17 DIAGNOSIS — M170 Bilateral primary osteoarthritis of knee: Secondary | ICD-10-CM

## 2017-12-17 DIAGNOSIS — M5136 Other intervertebral disc degeneration, lumbar region: Secondary | ICD-10-CM

## 2017-12-17 DIAGNOSIS — M351 Other overlap syndromes: Secondary | ICD-10-CM

## 2017-12-17 DIAGNOSIS — Z8639 Personal history of other endocrine, nutritional and metabolic disease: Secondary | ICD-10-CM

## 2017-12-17 DIAGNOSIS — Z87448 Personal history of other diseases of urinary system: Secondary | ICD-10-CM

## 2017-12-17 DIAGNOSIS — Z87898 Personal history of other specified conditions: Secondary | ICD-10-CM | POA: Diagnosis not present

## 2017-12-17 DIAGNOSIS — Z8659 Personal history of other mental and behavioral disorders: Secondary | ICD-10-CM

## 2017-12-17 DIAGNOSIS — M51369 Other intervertebral disc degeneration, lumbar region without mention of lumbar back pain or lower extremity pain: Secondary | ICD-10-CM

## 2017-12-17 DIAGNOSIS — M19042 Primary osteoarthritis, left hand: Secondary | ICD-10-CM

## 2017-12-17 MED ORDER — METHOTREXATE 2.5 MG PO TABS: ORAL_TABLET | ORAL | 2 refills | Status: DC

## 2017-12-17 NOTE — Patient Instructions (Signed)
Standing Labs We placed an order today for your standing lab work.    Please come back and get your standing labs in 3 months and then every 3 months  We have open lab Monday through Friday from 8:30-11:30 AM and 1:30-4:00 PM  at the office of Dr. Tosha Belgarde.   You may experience shorter wait times on Monday and Friday afternoons. The office is located at [address], [address], [place], [place] [address] No appointment is necessary.   Labs are drawn by [facility].  You may receive a bill from [facility] for your lab work. If you have any questions regarding directions or hours of operation,  please call [phone].    

## 2017-12-17 NOTE — Progress Notes (Incomplete)
Office Visit Note  Patient: Gloria Porter             Date of Birth: Feb 04, 1956           MRN: [id number]             PCP: Florestine Avers (Inactive) Referring: No ref. provider found Visit Date: 12/17/2017 Occupation: @GUAROCC @    Subjective:  Pain in hands.  History of Present Illness: Gloria Porter is a 62 y.o. female with history of mixed connective tissue disease, Sjogren's and osteoarthritis.  She states she continues to have tightness in her hands.  She was referred by her dermatologist to [facility].  She states she has not had a skin biopsy yet.  She has been taking methotrexate Plaquenil and folic acid on a regular basis.  She continues to have sicca symptoms.  She has some discomfort in her right knee joint.  She states she has been using diclofenac which is been helpful.  She does have some generalized discomfort from fibromyalgia.  Activities of Daily Living:  Patient reports morning stiffness for all day hours.   Patient Denies nocturnal pain.  Difficulty dressing/grooming: Denies Difficulty climbing stairs: Reports Difficulty getting out of chair: Reports Difficulty using hands for taps, buttons, cutlery, and/or writing: Reports   Review of Systems  Constitutional: Positive for fatigue. Negative for night sweats, weight gain and weight loss.  HENT: Positive for mouth dryness. Negative for mouth sores, trouble swallowing, trouble swallowing and nose dryness.   Eyes: Positive for dryness. Negative for pain, redness and visual disturbance.  Respiratory: Positive for shortness of breath. Negative for cough and difficulty breathing.        With exertion  Cardiovascular: Negative for chest pain, palpitations, hypertension, irregular heartbeat and swelling in legs/feet.  Gastrointestinal: Positive for constipation. Negative for blood in stool and diarrhea.  Endocrine: Negative for increased urination.  Genitourinary: Negative for vaginal dryness.    Musculoskeletal: Positive for arthralgias, joint pain, myalgias, morning stiffness and myalgias. Negative for joint swelling, muscle weakness and muscle tenderness.  Skin: Positive for skin tightness. Negative for color change, rash, hair loss, ulcers and sensitivity to sunlight.  Allergic/Immunologic: Negative for susceptible to infections.  Neurological: Negative for dizziness, memory loss, night sweats and weakness.  Hematological: Negative for swollen glands.  Psychiatric/Behavioral: Positive for depressed mood and sleep disturbance. The patient is nervous/anxious.     PMFS History:  Patient Active Problem List   Diagnosis Date Noted  . Primary osteoarthritis of both hands 04/10/2017  . Primary osteoarthritis of both knees 04/10/2017  . DDD (degenerative disc disease), lumbar 04/10/2017  . Mixed connective tissue disease ([place]) 04/10/2017  . S/P trigger finger release bilateral third 03/14/2017  . MGUS (monoclonal gammopathy of unknown significance) 03/11/2017  . History of chronic kidney disease 03/11/2017  . Monoclonal paraproteinemia 01/11/2016  . Restless leg 01/11/2015  . Carpal tunnel syndrome 12/28/2014  . Dysphagia, oropharyngeal 12/28/2014  . Anxiety, generalized 10/14/2014  . Generalized joint pain 08/17/2014  . Encounter for therapeutic drug monitoring 07/13/2014  . Failed back syndrome of lumbar spine 07/13/2014  . Encounter for therapeutic drug level monitoring 07/13/2014  . 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 Diabetes ([place]) 06/16/2014  . Fibrositis 06/16/2014  . Cannot sleep 06/16/2014  . Extreme obesity 06/16/2014  . Neuropathy 06/16/2014  . Allergic rhinitis, seasonal 06/16/2014  . Post menopausal syndrome 06/16/2014  . 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 Spinal stenosis 06/16/2014  . Adult hypothyroidism 06/16/2014  .  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 Diabetes mellitus ([place]) 06/16/2014  . Fibromyalgia 06/16/2014  . Morbid  obesity ([place]) 06/16/2014  . Sjogren's syndrome ([place]) 06/16/2014  . Breath shortness 01/21/2014  . Essential (primary) hypertension 01/21/2014  . Acid reflux 01/21/2014  . Adiposity 01/21/2014  . Clinical depression 08/26/2013  . Obstructive apnea 01/17/2013  . 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 Pituitary adenoma ([place]) 05/23/2011    Past Medical History:  Diagnosis Date  . Anxiety   . Arthritis   . Cushing's disease ([place])    pituitary tumor removed 2012  . Diabetes mellitus    insulin pump.followed dr Peyton Bottoms [facility]  . Difficult intubation    2011 scratched trachea  . Fibromyalgia   . GERD (gastroesophageal reflux disease)   . Headache(784.0)   . Hypothyroidism   . Lumbar disc disease   . Neuropathy   . Sjogren's disease ([place])   . Sleep apnea    no cpap.sleep study 2006 [place]    Family History  Problem Relation Age of Onset  . Diabetes type II Mother   . Diabetes type II Father   . Heart disease Father   . Heart attack Father   . Colon cancer Maternal Grandmother   . Heart disease Maternal Grandfather    Past Surgical History:  Procedure Laterality Date  . APPENDECTOMY    . BACK SURGERY     lumbar fusion  . BRAIN SURGERY    . CESAREAN SECTION    . ENDOMETRIAL ABLATION    . FRACTURE SURGERY     l arm  . LUMBAR LAMINECTOMY/DECOMPRESSION MICRODISCECTOMY  11/13/2011   Procedure: LUMBAR LAMINECTOMY/DECOMPRESSION MICRODISCECTOMY;  Surgeon: Faythe Ghee, MD;  Location: [facility];  Service: Neurosurgery;  Laterality: Bilateral;  Bilateral Lumbar four-five Decompression  . TONSILLECTOMY    . TRANSPHENOIDAL / TRANSNASAL HYPOPHYSECTOMY / RESECTION PITUITARY TUMOR    . TRIGGER FINGER RELEASE    . TUBAL LIGATION     Social History   Social History Narrative  . Not on file     Objective: Vital Signs: BP 130/76 (BP Location: Left Arm, Patient Position: Sitting, Cuff Size: Large)   Pulse 88   Resp 16   Ht 5\' 3"  (1.6 m)   Wt 266 lb  (120.7 kg)   LMP 11/08/2006   BMI 47.12 kg/m    Physical Exam  Constitutional: She is oriented to person, place, and time. She appears well-developed and well-nourished.  HENT:  Head: Normocephalic and atraumatic.  Eyes: Conjunctivae and EOM are normal.  Neck: Normal range of motion.  Cardiovascular: Normal rate, regular rhythm, normal heart sounds and intact distal pulses.  Pulmonary/Chest: Effort normal and breath sounds normal.  Abdominal: Soft. Bowel sounds are normal.  Lymphadenopathy:    She has no cervical adenopathy.  Neurological: She is alert and oriented to person, place, and time.  Skin: Skin is warm and dry. Capillary refill takes less than 2 seconds.  skin thickening was noted on both upper and lower extremities.  She does not have much wrinkles on her face.  I do not see many Telengectesia's.  No nailbed capillary changes were noted.  Psychiatric: She has a normal mood and affect. Her behavior is normal.  Nursing note and vitals reviewed.    Musculoskeletal Exam: C-spine thoracic lumbar spine limited range of motion.  Shoulder joints elbow joints wrist joints are good range of motion.  She is incomplete fist formation due to tightness in her hands.  She also  has some osteoarthritic changes in her hands.  Hip joints knee joints ankles were in good range of motion without synovitis.  She is some generalized hyperalgesia.  CDAI Exam: No CDAI exam completed.    Investigation: No additional findings.PLQ eye exam: 06/19/2017 CBC Latest Ref Rng & Units 09/30/2017 09/10/2017 03/14/2017  WBC 3.8 - 10.8 Thousand/uL 7.7 6.8 11.6(H)  Hemoglobin 11.7 - 15.5 g/dL 12.9 13.1 12.4  Hematocrit 35.0 - 45.0 % 39.4 40.9 39.0  Platelets 140 - 400 Thousand/uL 221 206 270   CMP Latest Ref Rng & Units 09/30/2017 09/10/2017 03/14/2017  Glucose 65 - 99 mg/dL 133(H) 225(H) 165(H)  BUN 7 - 25 mg/dL 14 17 13   Creatinine 0.50 - 0.99 mg/dL 0.63 0.75 0.62  Sodium 135 - 146 mmol/L 135 135 138   Potassium 3.5 - 5.3 mmol/L 4.5 4.2 4.5  Chloride 98 - 110 mmol/L 102 103 101  CO2 20 - 32 mmol/L 28 25 22   Calcium 8.6 - 10.4 mg/dL 9.7 9.3 8.6  Total Protein 6.1 - 8.1 g/dL 7.3 7.7 7.2  Total Bilirubin 0.2 - 1.2 mg/dL 0.5 0.4 0.3  Alkaline Phos 33 - 130 U/L - - 82  AST 10 - 35 U/L 17 19 19   ALT 6 - 29 U/L 20 17 15     Imaging: No results found.  Speciality Comments: PLQ eye exam: 06/19/2017 Normal. [facility]. Follow up in 6 months.    Procedures:  No procedures performed Allergies: Atorvastatin; Codeine; and Quinapril hcl   Assessment / Plan:     Visit Diagnoses: Mixed connective tissue disease ([place]) - ANA 1:160 NS, RNP positive, inflammatory arthritis.Synovitis and tenosynovitis on ultrasound of hands on 08/21/17.  Concerned that she may have a component of scleroderma due to tightness in the skin in upper and lower extremities.  She is also lost wrinkles on her face.- Plan: Urinalysis, Routine w reflex microscopic.  I discussed her diagnosis at length with her.  She would prefer a second opinion.  I will make a referral to [facility] rheumatology.  High risk medication use - MTX 6 tabs po qwk, folic acid 2mg  po qd, PLQ 200mg  po bid. eye exam: 06/19/2017 - Plan: CBC with Differential/Platelet, COMPLETE METABOLIC PANEL WITH GFR  Skin thickening -  A referral was made to dermatology to perform a skin biopsy to differentiate between scleroderma vs. Diabetic sclerosis.  Patient has not had a skin biopsy it.  Sjogren's syndrome with keratoconjunctivitis sicca (HCC)-she continues to have sicca symptoms.  Over-the-counter products were discussed.    Primary osteoarthritis of both hands-joint protection muscle strengthening was discussed.  Primary osteoarthritis of both knees-  DDD (degenerative disc disease), lumbar - s/p laminectomy-she has limited range of motion of her lumbar spine.  Fibromyalgia-she continues to have some generalized pain and discomfort.  History of chronic pain  -   She goes to a pain clinic.   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  History of hypothyroidism - Followed by endocrinologist   History of chronic kidney disease  History of diabetes mellitus  History of depression  History of anxiety    Orders: Orders Placed This Encounter  Procedures  . CBC with Differential/Platelet  . COMPLETE METABOLIC PANEL WITH GFR  . Urinalysis, Routine w reflex microscopic  . Ambulatory referral to Rheumatology   Meds ordered this  encounter  Medications  . methotrexate (RHEUMATREX) 2.5 MG tablet    Sig: Take 6 tablets once weekly Caution:Chemotherapy. Protect from light.    Dispense:  24 tablet    Refill:  2    Face-to-face time spent with patient was 30 minutes.  .50% of time was spent in counseling and coordination of care.  Follow-Up Instructions: Return in about 4 months (around 04/19/2018) for MCTD.   Bo Merino, MD  Note - This record has been created using Editor, commissioning.  Chart creation errors have been sought, but may not always  have been located. Such creation errors do not reflect on  the standard of medical care.

## 2017-12-18 ENCOUNTER — Other Ambulatory Visit: Payer: Self-pay | Admitting: Rheumatology

## 2017-12-18 LAB — URINALYSIS, ROUTINE W REFLEX MICROSCOPIC
BILIRUBIN URINE: NEGATIVE
Hgb urine dipstick: NEGATIVE
Hyaline Cast: NONE SEEN /LPF
Ketones, ur: NEGATIVE
Leukocytes, UA: NEGATIVE
NITRITE: POSITIVE — AB
Protein, ur: NEGATIVE
RBC / HPF: NONE SEEN /HPF (ref 0–2)
SPECIFIC GRAVITY, URINE: 1.029 (ref 1.001–1.03)
pH: 6.5 (ref 5.0–8.0)

## 2017-12-18 LAB — CBC WITH DIFFERENTIAL/PLATELET
BASOS ABS: 47 {cells}/uL (ref 0–200)
Basophils Relative: 0.7 %
EOS ABS: 141 {cells}/uL (ref 15–500)
Eosinophils Relative: 2.1 %
HEMATOCRIT: 38.2 % (ref 35.0–45.0)
HEMOGLOBIN: 12.6 g/dL (ref 11.7–15.5)
Lymphs Abs: 1447 cells/uL (ref 850–3900)
MCH: 27.8 pg (ref 27.0–33.0)
MCHC: 33 g/dL (ref 32.0–36.0)
MCV: 84.1 fL (ref 80.0–100.0)
MPV: 10.3 fL (ref 7.5–12.5)
Monocytes Relative: 9 %
NEUTROS ABS: 4462 {cells}/uL (ref 1500–7800)
Neutrophils Relative %: 66.6 %
Platelets: 197 10*3/uL (ref 140–400)
RBC: 4.54 10*6/uL (ref 3.80–5.10)
RDW: 17.7 % — ABNORMAL HIGH (ref 11.0–15.0)
Total Lymphocyte: 21.6 %
WBC: 6.7 10*3/uL (ref 3.8–10.8)
WBCMIX: 603 {cells}/uL (ref 200–950)

## 2017-12-18 LAB — COMPLETE METABOLIC PANEL WITH GFR
AG RATIO: 1.2 (calc) (ref 1.0–2.5)
ALBUMIN MSPROF: 3.8 g/dL (ref 3.6–5.1)
ALT: 20 U/L (ref 6–29)
AST: 22 U/L (ref 10–35)
Alkaline phosphatase (APISO): 69 U/L (ref 33–130)
BILIRUBIN TOTAL: 0.3 mg/dL (ref 0.2–1.2)
BUN: 13 mg/dL (ref 7–25)
CHLORIDE: 101 mmol/L (ref 98–110)
CO2: 29 mmol/L (ref 20–32)
Calcium: 9.3 mg/dL (ref 8.6–10.4)
Creat: 0.68 mg/dL (ref 0.50–0.99)
GFR, EST AFRICAN AMERICAN: 109 mL/min/{1.73_m2} (ref >=60)
GFR, Est Non African American: 94 mL/min/{1.73_m2} (ref >=60)
GLOBULIN: 3.2 g/dL (ref 1.9–3.7)
Glucose, Bld: 64 mg/dL — ABNORMAL LOW (ref 65–99)
POTASSIUM: 4 mmol/L (ref 3.5–5.3)
SODIUM: 139 mmol/L (ref 135–146)
TOTAL PROTEIN: 7 g/dL (ref 6.1–8.1)

## 2017-12-18 NOTE — Telephone Encounter (Signed)
Last Visit: 12/17/17 Next Visit: 04/22/18 Labs: 12/17/17 cbc/cmp wnl  Okay to refill per Dr. Estanislado Pandy

## 2017-12-18 NOTE — Progress Notes (Signed)
UA is positive for nitrite and bacteria. Please advise her to see her PCP if symptomatic.

## 2018-01-09 ENCOUNTER — Telehealth: Payer: Self-pay | Admitting: Rheumatology

## 2018-01-09 NOTE — Telephone Encounter (Signed)
Per note from Dr Barbee Shropshire:   I suggest considering increasing methotrexate to 10 tablets by mouth weekly. On the day once weekly that you take methotrexate, please split the dose to take half the tablets in the morning and half the tablets in the evening. - Continue folic acid 1mg  by mouth daily. - Continue plaquenil 200mg  2 tablets by mouth daily. Could consider taking it every day rather than just Monday through Friday. - Continue ACT lozenges and systane eye drops for dry eyes.   As I am currently only providing a second opinion and she plans on continuing with Dr. Estanislado Pandy, I defer making these changes to her primary rheumatologist if she is also in agreement. She will return to me in 6 months to follow-up on her progress and provide any additional recommendations  Please advise

## 2018-01-09 NOTE — Telephone Encounter (Signed)
Patient calling to see if Dr. received correspondence from Dr. Barbee Shropshire about increasing her meds. Patient would like to see if doctor agrees with this, and discuss a new rx being sent to [facility] if she does. Please call patient to info.

## 2018-01-10 NOTE — Telephone Encounter (Signed)
Patient will follow the recommendations given by Dr. Barbee Shropshire.

## 2018-01-13 MED ORDER — HYDROXYCHLOROQUINE SULFATE 200 MG PO TABS: 200.0000 mg | ORAL_TABLET | Freq: Two times a day (BID) | ORAL | 0 refills | Status: DC

## 2018-01-13 MED ORDER — METHOTREXATE 2.5 MG PO TABS: 25.0000 mg | ORAL_TABLET | ORAL | 0 refills | Status: DC

## 2018-01-13 NOTE — Telephone Encounter (Signed)
Last Visit: 12/17/17 Next Visit: 04/22/18 Labs: 12/17/17 cbc/cmp wnl PLQ eye exam: 06/19/2017 Normal.   Okay to refill per Dr. Estanislado Pandy  Patient advised an prescriptions sent to the pharmacy.

## 2018-01-13 NOTE — Addendum Note (Signed)
Addended by: Carole Binning on: 01/13/2018 10:09 AM   Modules accepted: Orders

## 2018-02-02 ENCOUNTER — Encounter: Payer: Self-pay | Admitting: Internal Medicine

## 2018-03-21 ENCOUNTER — Other Ambulatory Visit: Payer: Self-pay

## 2018-03-21 DIAGNOSIS — Z79899 Other long term (current) drug therapy: Secondary | ICD-10-CM

## 2018-03-21 LAB — COMPLETE METABOLIC PANEL WITH GFR
AG Ratio: 1.2 (calc) (ref 1.0–2.5)
ALT: 24 U/L (ref 6–29)
AST: 22 U/L (ref 10–35)
Albumin: 4 g/dL (ref 3.6–5.1)
Alkaline phosphatase (APISO): 77 U/L (ref 33–130)
BUN: 12 mg/dL (ref 7–25)
CALCIUM: 9.2 mg/dL (ref 8.6–10.4)
CO2: 25 mmol/L (ref 20–32)
Chloride: 99 mmol/L (ref 98–110)
Creat: 0.64 mg/dL (ref 0.50–0.99)
GFR, EST AFRICAN AMERICAN: 112 mL/min/{1.73_m2} (ref >=60)
GFR, Est Non African American: 96 mL/min/{1.73_m2} (ref >=60)
GLUCOSE: 141 mg/dL — AB (ref 65–99)
Globulin: 3.4 g/dL (calc) (ref 1.9–3.7)
Potassium: 4.4 mmol/L (ref 3.5–5.3)
Sodium: 141 mmol/L (ref 135–146)
TOTAL PROTEIN: 7.4 g/dL (ref 6.1–8.1)
Total Bilirubin: 0.5 mg/dL (ref 0.2–1.2)

## 2018-03-21 LAB — CBC WITH DIFFERENTIAL/PLATELET
BASOS PCT: 0.5 %
Basophils Absolute: 33 cells/uL (ref 0–200)
EOS ABS: 211 {cells}/uL (ref 15–500)
EOS PCT: 3.2 %
HEMATOCRIT: 41.4 % (ref 35.0–45.0)
HEMOGLOBIN: 13.6 g/dL (ref 11.7–15.5)
LYMPHS ABS: 1287 {cells}/uL (ref 850–3900)
MCH: 28.9 pg (ref 27.0–33.0)
MCHC: 32.9 g/dL (ref 32.0–36.0)
MCV: 87.9 fL (ref 80.0–100.0)
MPV: 10.6 fL (ref 7.5–12.5)
Monocytes Relative: 7 %
NEUTROS ABS: 4607 {cells}/uL (ref 1500–7800)
Neutrophils Relative %: 69.8 %
Platelets: 198 10*3/uL (ref 140–400)
RBC: 4.71 10*6/uL (ref 3.80–5.10)
RDW: 17 % — ABNORMAL HIGH (ref 11.0–15.0)
Total Lymphocyte: 19.5 %
WBC mixed population: 462 cells/uL (ref 200–950)
WBC: 6.6 10*3/uL (ref 3.8–10.8)

## 2018-03-28 ENCOUNTER — Telehealth: Payer: Self-pay | Admitting: Rheumatology

## 2018-03-28 MED ORDER — PROMETHAZINE HCL 25 MG PO TABS: 25.0000 mg | ORAL_TABLET | Freq: Every evening | ORAL | 0 refills | Status: DC | PRN

## 2018-03-28 NOTE — Telephone Encounter (Signed)
She should see her PCP for the rash. Ok to call in [place].

## 2018-03-28 NOTE — Telephone Encounter (Signed)
Patient called stating she has been taking her Methotrexate 10 pills every Monday and usually feels nauseated for two days after.  This week she has been nauseated all week and has been throwing up.  Patient states she has been unable to keep anything down.  Patient states she has also noticed small bumps on both arms that have turned into water blisters.  Patient states she has only been able to eat ice chips and is requesting a prescription of Phenergan which she has taken in the past to be called into [facility] on [address].

## 2018-03-28 NOTE — Telephone Encounter (Signed)
Advised patient to see PCP for the rash and patient verbalized understanding. Prescription for phenergan sent to the pharmacy, patient is aware.

## 2018-04-08 NOTE — Progress Notes (Signed)
Office Visit Note  Patient: Gloria Porter             Date of Birth: Apr 23, 1956           MRN: [id number]             PCP: Florestine Avers (Inactive) Referring: No ref. provider found Visit Date: 04/22/2018 Occupation: @GUAROCC @  Subjective:  Pain in both hands   History of Present Illness: Gloria Porter is a 62 y.o. female with history of mixed connective tissue disease, osteoarthritis, sjogren's, and fibromyalgia.  She is on MTX 8 tablets po once a week, folic acid 2 mg po daily, and PLQ 200 mg po BID. She went for a second opinion at [facility] and was evaluated by Dr. Barbee Shropshire.  He recommended that she increase her dose of methotrexate to 10 tablets by mouth once a week.  3 weeks ago she was tried taking 10 tablets by mouth but had severe nausea that lasted 3 to 4 days.  She took Mylanta which provided temporary relief.  She then took Phenergan which provided significant relief.  She restarted on methotrexate but has been taking 8 tablets by mouth once a week.  She continues to have discomfort in bilateral hands.  She has skin thickening in her hands and ankles.  She states that she was referred to a dermatologist in the past but a biopsy was not performed.  She is unsure which dermatologist office she went to. She continues to have sicca symptoms.  She uses active allergens as well as Systane eyedrops for dry eyes.  She reports that she has intermittent mouth sores.  She uses Orajel mouthwash as needed.  She denies any rashes recently.  She continues to have Raynaud's. She denies any photosensitivity.  She continues to have chronic fatigue.   Activities of Daily Living:  Patient reports morning stiffness for 20-30 minutes.   Patient Denies nocturnal pain.  Difficulty dressing/grooming: Denies Difficulty climbing stairs: Reports Difficulty getting out of chair: Denies Difficulty using hands for taps, buttons, cutlery, and/or writing: Reports  Review of Systems  Constitutional:  Positive for fatigue.  HENT: Positive for mouth sores (Painful) and mouth dryness (ACT lozenges). Negative for nose dryness.   Eyes: Positive for dryness. Negative for pain and visual disturbance.  Respiratory: Negative for cough, hemoptysis, shortness of breath and difficulty breathing.   Cardiovascular: Negative for chest pain, palpitations, hypertension and swelling in legs/feet.  Gastrointestinal: Positive for constipation (Taking fiber). Negative for blood in stool and diarrhea.  Endocrine: Negative for increased urination.  Genitourinary: Negative for painful urination.  Musculoskeletal: Positive for arthralgias and joint pain. Negative for joint swelling, myalgias, muscle weakness, morning stiffness, muscle tenderness and myalgias.  Skin: Positive for color change and skin tightness. Negative for pallor, rash, hair loss, nodules/bumps, ulcers and sensitivity to sunlight.  Allergic/Immunologic: Negative for susceptible to infections.  Neurological: Negative for dizziness, numbness, headaches and weakness.  Hematological: Negative for swollen glands.  Psychiatric/Behavioral: Positive for depressed mood and sleep disturbance. The patient is nervous/anxious.     PMFS History:  Patient Active Problem List   Diagnosis Date Noted  . Primary osteoarthritis of both hands 04/10/2017  . Primary osteoarthritis of both knees 04/10/2017  . DDD (degenerative disc disease), lumbar 04/10/2017  . Mixed connective tissue disease ([place]) 04/10/2017  . S/P trigger finger release bilateral third 03/14/2017  . MGUS (monoclonal gammopathy of unknown significance) 03/11/2017  . History of chronic kidney disease 03/11/2017  . Monoclonal paraproteinemia 01/11/2016  .  Restless leg 01/11/2015  . Carpal tunnel syndrome 12/28/2014  . Dysphagia, oropharyngeal 12/28/2014  . Anxiety, generalized 10/14/2014  . Generalized joint pain 08/17/2014  . Encounter for therapeutic drug monitoring 07/13/2014  . Failed back  syndrome of lumbar spine 07/13/2014  . Encounter for therapeutic drug level monitoring 07/13/2014  . 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 Diabetes ([place]) 06/16/2014  . Fibrositis 06/16/2014  . Cannot sleep 06/16/2014  . Extreme obesity 06/16/2014  . Neuropathy 06/16/2014  . Allergic rhinitis, seasonal 06/16/2014  . Post menopausal syndrome 06/16/2014  . 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 Spinal stenosis 06/16/2014  . Adult hypothyroidism 06/16/2014  . 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 Diabetes mellitus ([place]) 06/16/2014  . Fibromyalgia 06/16/2014  . Morbid obesity ([place]) 06/16/2014  . Sjogren's syndrome ([place]) 06/16/2014  . Breath shortness 01/21/2014  . Essential (primary) hypertension 01/21/2014  . Acid reflux 01/21/2014  . Adiposity 01/21/2014  . Clinical depression 08/26/2013  . Obstructive apnea 01/17/2013  . Cushing's syndrome ([place]) 05/23/2011  . Pituitary adenoma ([place]) 05/23/2011    Past Medical History:  Diagnosis Date  . Anxiety   . Arthritis   . Cushing's disease ([place])    pituitary tumor removed 2012  . Diabetes mellitus    insulin pump.followed dr Peyton Bottoms [facility]  . Difficult intubation    2011 scratched trachea  . Fibromyalgia   . GERD (gastroesophageal reflux disease)   . Headache(784.0)   . Hypothyroidism   . Lumbar disc disease   . Neuropathy   . Sjogren's disease ([place])   . Sleep apnea    no cpap.sleep study 2006 [place]    Family History  Problem Relation Age of Onset  . Diabetes type II Mother   . Diabetes type II Father   . Heart disease Father   . Heart attack Father   . Colon cancer Maternal Grandmother   . Heart disease Maternal Grandfather    Past Surgical History:  Procedure Laterality Date  . APPENDECTOMY    . BACK SURGERY     lumbar fusion  . BRAIN SURGERY    . CESAREAN SECTION    . ENDOMETRIAL ABLATION    . FRACTURE SURGERY     l arm  . LUMBAR LAMINECTOMY/DECOMPRESSION MICRODISCECTOMY   11/13/2011   Procedure: LUMBAR LAMINECTOMY/DECOMPRESSION MICRODISCECTOMY;  Surgeon: Faythe Ghee, MD;  Location: [facility];  Service: Neurosurgery;  Laterality: Bilateral;  Bilateral Lumbar four-five Decompression  . TONSILLECTOMY    . TRANSPHENOIDAL / TRANSNASAL HYPOPHYSECTOMY / RESECTION PITUITARY TUMOR    . TRIGGER FINGER RELEASE    . TUBAL LIGATION     Social History   Social History Narrative  . Not on file    Objective: Vital Signs: BP (!) 139/54 (BP Location: Left Arm, Patient Position: Sitting, Cuff Size: Small)   Pulse 77   Resp 14   Ht 5\' 3"  (1.6 m)   Wt 263 lb 12.8 oz (119.7 kg)   LMP 11/08/2006   BMI 46.73 kg/m    Physical Exam  Constitutional: She is oriented to person, place, and time. She appears well-developed and well-nourished.  HENT:  Head: Normocephalic and atraumatic.  Eyes: Conjunctivae and EOM are normal.  Neck: Normal range of motion.  Cardiovascular: Normal rate, regular rhythm, normal heart sounds and intact distal pulses.  Pulmonary/Chest: Effort normal and breath sounds normal.  Abdominal: Soft. Bowel sounds are normal.  Lymphadenopathy:    She has no cervical adenopathy.  Neurological: She is alert and oriented to  person, place, and time.  Skin: Skin is warm and dry. Capillary refill takes less than 2 seconds.  Psychiatric: She has a normal mood and affect. Her behavior is normal.  Nursing note and vitals reviewed.    Musculoskeletal Exam: Generalized hyperalgesia.  C-spine, thoracic spine, and lumbar spine good ROM.  Shoulder joints, elbow joints, wrist joints, MCPs, PIPs and DIPs good ROM with no synovitis. PIP and DIP synovial thickening.  Skin thickening noted.  Hip joints, knee joints, ankle joints, MTPs, PIPs, and DIPs good ROM with no synovitis.  No warmth or effusion of knee joints.    CDAI Exam: CDAI Score: Not documented Patient Global Assessment: Not documented; Provider Global Assessment: Not documented Swollen: Not documented;  Tender: Not documented Joint Exam   Not documented   There is currently no information documented on the homunculus. Go to the Rheumatology activity and complete the homunculus joint exam.  Investigation: No additional findings.  Imaging: No results found.  Recent Labs: Lab Results  Component Value Date   WBC 6.6 03/21/2018   HGB 13.6 03/21/2018   PLT 198 03/21/2018   NA 141 03/21/2018   K 4.4 03/21/2018   CL 99 03/21/2018   CO2 25 03/21/2018   GLUCOSE 141 (H) 03/21/2018   BUN 12 03/21/2018   CREATININE 0.64 03/21/2018   BILITOT 0.5 03/21/2018   ALKPHOS 82 03/14/2017   AST 22 03/21/2018   ALT 24 03/21/2018   PROT 7.4 03/21/2018   ALBUMIN 3.5 (L) 03/14/2017   CALCIUM 9.2 03/21/2018   GFRAA 112 03/21/2018    Speciality Comments: PLQ eye exam: 06/19/2017 Normal. [facility]. Follow up in 6 months.  Procedures:  No procedures performed Allergies: Atorvastatin; Codeine; and Quinapril hcl   Assessment / Plan:     Visit Diagnoses: Mixed connective tissue disease ([place]): ANA 1:160 NS, RNP positive, inflammatory arthritis. She had synovitis and tenosynovitis on ultrasound of hands on 08/21/17. We are concerned she may have a component of scleroderma due to increased skin tightness in distal upper and lower extremities. We will place a referral to be evaluated by a dermatologist so she can have a skin biopsy performed.  She went for a second opinion at [facility] and she was evaluated by Dr. Barbee Shropshire, who recommended that she increase MTX dose to 10 tablets by mouth once a week.  She experienced severe nausea and could not tolerate the increased dose of MTX.  She will continue on current treatment regimen of MTX 8 tablets by mouth once a week, folic acid 2 mg daily, and PLQ 200 mg po BID.  She will follow up in 5 months. She was advised to notify us if develops new or worsening symptoms.    High risk medication use -  MTX 8 tabs po qwk, folic acid 2mg  po qd, PLQ 200mg  po bid. eye exam:  06/19/2017. CBC and CMP were drawn on 03/21/18.  She will return for lab work in November and every 3 months to monitor for drug toxicity.    Skin thickening - A referral was made to dermatology to perform a skin biopsy to differentiate between scleroderma vs. Diabetic sclerosis.   Sjogren's syndrome with keratoconjunctivitis sicca ([place]): She continues to take ACT lozenges and Systane eye drops for eye dryness.  She has no parotid swelling on exam.    Primary osteoarthritis of both hands: She has PIP and DIP synovial thickening.  She continues to have discomfort in both hands.  No synovitis noted. Joint protection  and muscle strengthening were discussed.    Primary osteoarthritis of both knees: No warmth or effusion noted.  She has good ROM.   DDD (degenerative disc disease), lumbar -  s/p laminectomy: Chronic pain   Fibromyalgia: She has generalized hyperalgesia on exam today. She continues to have generalized muscle aches and muscle tenderness.  She has chronic fatigue and insomnia.  She was encouraged to stay active and exercise on a regular basis.   Other medical conditions are listed as follows:   MGUS (monoclonal gammopathy of unknown significance) - Followed up by hematology  History of chronic pain - She goes to pain management.   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 Ambulatory referral to Dermatology   No orders of the defined types were placed in this encounter.   Face-to-face time spent with patient was 30 minutes. Greater than 50% of time was spent in counseling and coordination of care.  Follow-Up Instructions: Return in about 5 months (around 09/22/2018) for Mixed connective tissue disease, Sjogren's syndrome, Osteoarthritis, Fibromyalgia.   Ofilia Neas,  PA-C  Note - This record has been created using Dragon software.  Chart creation errors have been sought, but may not always  have been located. Such creation errors do not reflect on  the standard of medical care.

## 2018-04-18 ENCOUNTER — Other Ambulatory Visit: Payer: Self-pay | Admitting: Rheumatology

## 2018-04-18 NOTE — Telephone Encounter (Signed)
Last Visit: 12/17/17 Next Visit: 04/22/18 Labs: 03/21/18 Glucose is elevated at 141. RDW stable. All other labs are WNL. PLQ eye exam: 06/19/2017 Normal.  Okay to refill per Dr. Estanislado Pandy

## 2018-04-22 ENCOUNTER — Encounter: Payer: Self-pay | Admitting: Physician Assistant

## 2018-04-22 ENCOUNTER — Ambulatory Visit (INDEPENDENT_AMBULATORY_CARE_PROVIDER_SITE_OTHER): Admitting: Physician Assistant

## 2018-04-22 VITALS — BP 139/54 | HR 77 | Resp 14 | Ht 63.0 in | Wt 263.8 lb

## 2018-04-22 DIAGNOSIS — Z8639 Personal history of other endocrine, nutritional and metabolic disease: Secondary | ICD-10-CM

## 2018-04-22 DIAGNOSIS — R234 Changes in skin texture: Secondary | ICD-10-CM

## 2018-04-22 DIAGNOSIS — Z8659 Personal history of other mental and behavioral disorders: Secondary | ICD-10-CM

## 2018-04-22 DIAGNOSIS — M351 Other overlap syndromes: Secondary | ICD-10-CM

## 2018-04-22 DIAGNOSIS — M5136 Other intervertebral disc degeneration, lumbar region: Secondary | ICD-10-CM

## 2018-04-22 DIAGNOSIS — M3501 Sicca syndrome with keratoconjunctivitis: Secondary | ICD-10-CM

## 2018-04-22 DIAGNOSIS — M797 Fibromyalgia: Secondary | ICD-10-CM

## 2018-04-22 DIAGNOSIS — Z79899 Other long term (current) drug therapy: Secondary | ICD-10-CM | POA: Diagnosis not present

## 2018-04-22 DIAGNOSIS — D472 Monoclonal gammopathy: Secondary | ICD-10-CM

## 2018-04-22 DIAGNOSIS — Z87898 Personal history of other specified conditions: Secondary | ICD-10-CM

## 2018-04-22 DIAGNOSIS — Z87448 Personal history of other diseases of urinary system: Secondary | ICD-10-CM

## 2018-04-22 DIAGNOSIS — M19041 Primary osteoarthritis, right hand: Secondary | ICD-10-CM

## 2018-04-22 DIAGNOSIS — M19042 Primary osteoarthritis, left hand: Secondary | ICD-10-CM

## 2018-04-22 DIAGNOSIS — M170 Bilateral primary osteoarthritis of knee: Secondary | ICD-10-CM

## 2018-04-22 NOTE — Patient Instructions (Signed)
Standing Labs We placed an order today for your standing lab work.    Please come back and get your standing labs in November and every 3 months   We have open lab Monday through Friday from 8:30-11:30 AM and 1:30-4:00 PM  at the office of Dr. Shaili Deveshwar.   You may experience shorter wait times on Monday and Friday afternoons. The office is located at [address], [address], [place], [place] [address] No appointment is necessary.   Labs are drawn by [facility].  You may receive a bill from [facility] for your lab work. If you have any questions regarding directions or hours of operation,  please call [phone].     

## 2018-05-09 ENCOUNTER — Telehealth: Payer: Self-pay | Admitting: Physician Assistant

## 2018-05-09 NOTE — Telephone Encounter (Signed)
05/08/18: Punch biopsy of left ankle revealed: Sparse dermatitis

## 2018-07-25 ENCOUNTER — Other Ambulatory Visit: Payer: Self-pay

## 2018-07-25 ENCOUNTER — Other Ambulatory Visit: Payer: Self-pay | Admitting: Rheumatology

## 2018-07-25 DIAGNOSIS — Z79899 Other long term (current) drug therapy: Secondary | ICD-10-CM

## 2018-07-25 LAB — COMPLETE METABOLIC PANEL WITH GFR
AG RATIO: 1.3 (calc) (ref 1.0–2.5)
ALT: 20 U/L (ref 6–29)
AST: 22 U/L (ref 10–35)
Albumin: 4.1 g/dL (ref 3.6–5.1)
Alkaline phosphatase (APISO): 76 U/L (ref 33–130)
BUN: 15 mg/dL (ref 7–25)
CO2: 29 mmol/L (ref 20–32)
Calcium: 9.4 mg/dL (ref 8.6–10.4)
Chloride: 101 mmol/L (ref 98–110)
Creat: 0.64 mg/dL (ref 0.50–0.99)
GFR, EST NON AFRICAN AMERICAN: 96 mL/min/{1.73_m2} (ref >=60)
GFR, Est African American: 111 mL/min/{1.73_m2} (ref >=60)
GLOBULIN: 3.2 g/dL (ref 1.9–3.7)
Glucose, Bld: 57 mg/dL — ABNORMAL LOW (ref 65–99)
Potassium: 4.5 mmol/L (ref 3.5–5.3)
Sodium: 139 mmol/L (ref 135–146)
Total Bilirubin: 0.4 mg/dL (ref 0.2–1.2)
Total Protein: 7.3 g/dL (ref 6.1–8.1)

## 2018-07-25 LAB — CBC WITH DIFFERENTIAL/PLATELET
BASOS PCT: 0.9 %
Basophils Absolute: 52 cells/uL (ref 0–200)
Eosinophils Absolute: 162 cells/uL (ref 15–500)
Eosinophils Relative: 2.8 %
HCT: 40.9 % (ref 35.0–45.0)
HEMOGLOBIN: 13.4 g/dL (ref 11.7–15.5)
Lymphs Abs: 1195 cells/uL (ref 850–3900)
MCH: 28.9 pg (ref 27.0–33.0)
MCHC: 32.8 g/dL (ref 32.0–36.0)
MCV: 88.1 fL (ref 80.0–100.0)
MPV: 10.7 fL (ref 7.5–12.5)
Monocytes Relative: 7.6 %
Neutro Abs: 3950 cells/uL (ref 1500–7800)
Neutrophils Relative %: 68.1 %
PLATELETS: 199 10*3/uL (ref 140–400)
RBC: 4.64 10*6/uL (ref 3.80–5.10)
RDW: 16.7 % — ABNORMAL HIGH (ref 11.0–15.0)
TOTAL LYMPHOCYTE: 20.6 %
WBC mixed population: 441 cells/uL (ref 200–950)
WBC: 5.8 10*3/uL (ref 3.8–10.8)

## 2018-07-28 NOTE — Telephone Encounter (Signed)
Last Visit: 04/22/18 Next Visit: 09/23/18 Labs: 07/25/18 Glucose is borderline low at 57. Rest of CMP WNL. CBC stable Eye exam: PLQ eye exam: 06/19/2017 Normal  Okay to refill per Dr. Estanislado Pandy

## 2018-09-09 NOTE — Progress Notes (Signed)
Office Visit Note  Patient: Gloria Porter             Date of Birth: [birth year]           MRN: [id number]             PCP: Florestine Avers (Inactive) Referring: No ref. provider found Visit Date: 09/23/2018 Occupation: @GUAROCC @  Subjective:  Pain in both wrists and both ankle joints   History of Present Illness: Gloria Porter is a 63 y.o. female with history of mixed connective tissue disease, Sjogren's, osteoarthritis, DDD, and fibromyalgia.  She is taking MTX 8 tablets once weekly and folic acid 1 mg po daily.    She has been experiencing nausea after taking MTX 8 tablet po once weekly.  She experiences significant fatigue 24-48 hours after taking MTX. She is out of phenergan and would like a refill.   She is open to injectable MTX. She is having pain in both ankle joints, both wrist joints, and both hands.  She has noticed swelling in ankle joint and both hands.  She is still unable to wear wedding band.  She has worse pain in the left ankle compared to the right ankle joint.  She uses voltaren gel and heating pad on both knees which helps provide relief.   She denies any recent rashes.   She has recurrent nasal ulcerations and oral ulcerations.  She uses oralgel PRN. She has chronic sicca symptoms.  She uses ACT lozenges and systane eye drops PRN for symptomatic relief. She has muscle tenderness worse in the lower extremities.   She sees cardiologist and pulmonologist on a regular basis and reports everything has been stable.   Activities of Daily Living:  Patient reports morning stiffness for 1 hour.   Patient Denies nocturnal pain.  Difficulty dressing/grooming: Reports Difficulty climbing stairs: Reports Difficulty getting out of chair: Reports Difficulty using hands for taps, buttons, cutlery, and/or writing: Reports  Review of Systems  Constitutional: Positive for fatigue.  HENT: Positive for mouth dryness. Negative for mouth sores and nose dryness.   Eyes:  Positive for dryness. Negative for pain and visual disturbance.  Respiratory: Negative for cough, hemoptysis, shortness of breath and difficulty breathing.   Cardiovascular: Positive for swelling in legs/feet. Negative for chest pain, palpitations and hypertension.  Gastrointestinal: Positive for constipation and nausea. Negative for blood in stool and diarrhea.  Endocrine: Negative for increased urination.  Genitourinary: Negative for painful urination.  Musculoskeletal: Positive for arthralgias, joint pain, joint swelling, muscle weakness and morning stiffness. Negative for myalgias, muscle tenderness and myalgias.  Skin: Negative for color change, pallor, rash, hair loss, nodules/bumps, skin tightness, ulcers and sensitivity to sunlight.  Allergic/Immunologic: Negative for susceptible to infections.  Neurological: Negative for dizziness and headaches.  Hematological: Negative for swollen glands.  Psychiatric/Behavioral: Positive for sleep disturbance. Negative for depressed mood. The patient is not nervous/anxious.     PMFS History:  Patient Active Problem List   Diagnosis Date Noted  . Primary osteoarthritis of both hands 04/10/2017  . Primary osteoarthritis of both knees 04/10/2017  . DDD (degenerative disc disease), lumbar 04/10/2017  . Mixed connective tissue disease ([place]) 04/10/2017  . S/P trigger finger release bilateral third 03/14/2017  . MGUS (monoclonal gammopathy of unknown significance) 03/11/2017  . History of chronic kidney disease 03/11/2017  . Monoclonal paraproteinemia 01/11/2016  . Restless leg 01/11/2015  . Carpal tunnel syndrome 12/28/2014  . Dysphagia, oropharyngeal 12/28/2014  . Anxiety, generalized 10/14/2014  . Generalized joint  pain 08/17/2014  . Encounter for therapeutic drug monitoring 07/13/2014  . Failed back syndrome of lumbar spine 07/13/2014  . Encounter for therapeutic drug level monitoring 07/13/2014  . 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  Diabetes ([place]) 06/16/2014  . Fibrositis 06/16/2014  . Cannot sleep 06/16/2014  . Extreme obesity 06/16/2014  . Neuropathy 06/16/2014  . Allergic rhinitis, seasonal 06/16/2014  . Post menopausal syndrome 06/16/2014  . 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 Spinal stenosis 06/16/2014  . Adult hypothyroidism 06/16/2014  . 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 Diabetes mellitus ([place]) 06/16/2014  . Fibromyalgia 06/16/2014  . Morbid obesity ([place]) 06/16/2014  . Sjogren's syndrome ([place]) 06/16/2014  . Breath shortness 01/21/2014  . Essential (primary) hypertension 01/21/2014  . Acid reflux 01/21/2014  . Adiposity 01/21/2014  . Clinical depression 08/26/2013  . Obstructive apnea 01/17/2013  . 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 Pituitary adenoma ([place]) 05/23/2011    Past Medical History:  Diagnosis Date  . Anxiety   . Arthritis   . Cushing's disease ([place])    pituitary tumor removed 2012  . Diabetes mellitus    insulin pump.followed dr Peyton Bottoms [email or web address]  . Difficult intubation    2011 scratched trachea  . Fibromyalgia   . GERD (gastroesophageal reflux disease)   . Headache(784.0)   . Hypothyroidism   . Lumbar disc disease   . Neuropathy   . Sjogren's disease ([place])   . Sleep apnea    no cpap.sleep study 2006 [facility]    Family History  Problem Relation Age of Onset  . Diabetes type II Mother   . Diabetes type II Father   . Heart disease Father   . Heart attack Father   . Colon cancer Maternal Grandmother   . Heart disease Maternal Grandfather    Past Surgical History:  Procedure Laterality Date  . APPENDECTOMY    . BACK SURGERY     lumbar fusion  . BRAIN SURGERY    . CESAREAN SECTION    . ENDOMETRIAL ABLATION    . FRACTURE SURGERY     l arm  . LUMBAR LAMINECTOMY/DECOMPRESSION MICRODISCECTOMY  11/13/2011   Procedure: LUMBAR LAMINECTOMY/DECOMPRESSION MICRODISCECTOMY;  Surgeon: Faythe Ghee, MD;  Location: [facility];  Service:  Neurosurgery;  Laterality: Bilateral;  Bilateral Lumbar four-five Decompression  . TONSILLECTOMY    . TRANSPHENOIDAL / TRANSNASAL HYPOPHYSECTOMY / RESECTION PITUITARY TUMOR    . TRIGGER FINGER RELEASE    . TUBAL LIGATION     Social History   Social History Narrative  . Not on file   Immunization History  Administered Date(s) Administered  . Influenza Split 06/13/2017     Objective: Vital Signs: BP 130/61 (BP Location: Left Arm, Patient Position: Sitting, Cuff Size: Normal)   Pulse 89   Resp 18   Ht 5\' 3"  (1.6 m)   Wt 264 lb 3.2 oz (119.8 kg)   LMP 11/08/2006   BMI 46.80 kg/m    Physical Exam Vitals signs and nursing note reviewed.  Constitutional:      Appearance: She is well-developed.  HENT:     Head: Normocephalic and atraumatic.  Eyes:     Conjunctiva/sclera: Conjunctivae normal.  Neck:     Musculoskeletal: Normal range of motion.  Cardiovascular:     Rate and Rhythm: Normal rate and regular rhythm.     Heart sounds: Normal heart sounds.  Pulmonary:     Effort: Pulmonary effort is normal.     Breath sounds: Normal breath sounds.  Abdominal:     General: Bowel sounds are normal.     Palpations: Abdomen is soft.  Lymphadenopathy:     Cervical: No cervical adenopathy.  Skin:    General: Skin is warm and dry.     Capillary Refill: Capillary refill takes less than 2 seconds.  Neurological:     Mental Status: She is alert and oriented to person, place, and time.  Psychiatric:        Behavior: Behavior normal.      Musculoskeletal Exam: C-spine, thoracic spine, and lumbar spine good ROM.  No midline spinal tenderness.  No SI joint tenderness.  Shoulder joints, wrist joints, MCPs, PIPs,and DIPs good ROM with no synovitis.Right elbow mild contracture.  PIP and DIP syonvial thickening consistent with osteoarthritis.  Hip joints, knee joints, ankle joints, MTPs, PIPs, and DIPs good ROM with no synovitis.  No warmth or effusion of knee joints.    CDAI Exam: CDAI  Score: Not documented Patient Global Assessment: Not documented; Provider Global Assessment: Not documented Swollen: Not documented; Tender: Not documented Joint Exam   Not documented   There is currently no information documented on the homunculus. Go to the Rheumatology activity and complete the homunculus joint exam.  Investigation: No additional findings.  Imaging: No results found.  Recent Labs: Lab Results  Component Value Date   WBC 5.8 07/25/2018   HGB 13.4 07/25/2018   PLT 199 07/25/2018   NA 139 07/25/2018   K 4.5 07/25/2018   CL 101 07/25/2018   CO2 29 07/25/2018   GLUCOSE 57 (L) 07/25/2018   BUN 15 07/25/2018   CREATININE 0.64 07/25/2018   BILITOT 0.4 07/25/2018   ALKPHOS 82 03/14/2017   AST 22 07/25/2018   ALT 20 07/25/2018   PROT 7.3 07/25/2018   ALBUMIN 3.5 (L) 03/14/2017   CALCIUM 9.4 07/25/2018   GFRAA 111 07/25/2018    Speciality Comments: PLQ eye exam: 06/19/2017 Normal. [facility]. Follow up in 6 months.  Procedures:  No procedures performed Allergies: Atorvastatin; Codeine; and Quinapril hcl   Assessment / Plan:     Visit Diagnoses: Mixed connective tissue disease ([place]) -  ANA 1:160 NS, RNP positive, inflammatory arthritis: She has no synovitis on exam.  She has not had any recent flares.  She is clinically doing well on MTX 8 tablets by mouth once weekly and folic acid 1 mg po daily.  She has been experiencing significant nausea 24-48 hours after taking weekly MTX tablets.  She has tried taking Phenergan in the past which was effective, and she would like a refill today.  We discussed switching her from tablets to injectable MTX.  We will apply for [place], and it is not approved she is ok with proceeding with vial and syringe. She was encouraged to increased the dose of folic acid to 2 mg po daily.  She will inject MTX 0.8 ml once weekly, take folic acid 2 mg po daily, and continue on PLQ 200 mg BID. A refill or PLQ and Phenergan were sent to  the pharmacy.  She was advised to notify us if she develops increase joint pain or joint swelling.  She has not had any recent rashes. She has no symptoms of Raynaud's and no digital ulcerations or signs of gangrene were noted. She has recurrent and oral ulcerations and severe sicca symptoms.  She is followed by Dr. Chase Caller and Dr. Haroldine Laws.   She was advised to notify us if she develops new or worsening symptoms.  She will follow up in 5 months.   Educated patient on how to use a vial and syringe and reviewed injection technique with patient.  Patient was able to demonstrate proper technique for injections using vial and syringe.  Provided patient educational material regarding injection technique and storage of methotrexate.    High risk medication use -She will be switching from MTX tablets to injectable MTX. She will start on 0.8 ml sq once weekly.  She could not tolerate tablets due to experience severe nausea. She will continue on PLQ 200mg  po BID. CBC and CMP were drawn on 07/25/18.  She will return for lab work in March and every 3 months.   Skin thickening  Sjogren's syndrome with keratoconjunctivitis sicca ([place]) -She continues to have severe sicca symptoms.  She continues to take ACT lozenges and Systane eye drops for eye dryness.  She is taking PLQ 200 mg BID.  She has no parotid swelling or tenderness on exam.    Primary osteoarthritis of both hands: She has PIP and DIP synovial thickening consistent with osteoarthritis.  She has no synovitis on exam.  Joint protection and muscle strengthening were discussed.  Primary osteoarthritis of both knees: No warmth or effusion.  She has good ROM with no discomfort.  She uses voltaren gel topically and heating pad if she experiences increased knee joint pain.  DDD (degenerative disc disease), lumbar - s/p laminectomy: Chronic pain.   Fibromyalgia: She has generalized muscle aches and muscle tenderness in bilateral lower extremities.  She  continues to have chronic fatigue and insomnia.    MGUS (monoclonal gammopathy of unknown significance) - She is followed by hematology.   Other medical conditions are listed as follows:   History of Cushing disease  History of hypothyroidism  History of chronic pain  History of chronic kidney disease  History of diabetes mellitus  History of depression  History of anxiety  Palpitations   Orders: No orders of the defined types were placed in this encounter.  Meds ordered this encounter  Medications  . hydroxychloroquine (PLAQUENIL) 200 MG tablet    Sig: Take 1 tablet (200 mg total) by mouth 2 (two) times daily.    Dispense:  180 tablet    Refill:  0  . promethazine (PHENERGAN) 25 MG tablet    Sig: Take 1 tablet (25 mg total) by mouth at bedtime as needed for nausea or vomiting.    Dispense:  30 tablet    Refill:  0    Face-to-face time spent with patient was 30 minutes. Greater than 50% of time was spent in counseling and coordination of care.  Follow-Up Instructions: Return in about 5 months (around 02/21/2019) for Mixed connective tissue disorder, Sjogren's syndrome, Osteoarthritis.   Ofilia Neas, PA-C   I examined and evaluated the patient with Hazel Sams PA.  She had no synovitis on my examination.  She continues to have some and stiffness and discomfort due to underlying osteoarthritis.  She states she has been having difficulty tolerating oral methotrexate.  We will switch her to subcu methotrexate.  The plan of care was discussed as noted above.  Bo Merino, MD  Note - This record has been created using Editor, commissioning.  Chart creation errors have been sought, but may not always  have been located. Such creation errors do not reflect on  the standard of medical care.

## 2018-09-11 ENCOUNTER — Ambulatory Visit (INDEPENDENT_AMBULATORY_CARE_PROVIDER_SITE_OTHER): Admitting: Neurology

## 2018-09-11 ENCOUNTER — Other Ambulatory Visit: Payer: Self-pay

## 2018-09-11 ENCOUNTER — Encounter: Payer: Self-pay | Admitting: Neurology

## 2018-09-11 VITALS — BP 134/72 | HR 82 | Resp 18 | Ht 63.0 in | Wt 261.5 lb

## 2018-09-11 DIAGNOSIS — M351 Other overlap syndromes: Secondary | ICD-10-CM | POA: Diagnosis not present

## 2018-09-11 DIAGNOSIS — Z9889 Other specified postprocedural states: Secondary | ICD-10-CM

## 2018-09-11 DIAGNOSIS — M5136 Other intervertebral disc degeneration, lumbar region: Secondary | ICD-10-CM | POA: Diagnosis not present

## 2018-09-11 DIAGNOSIS — M5417 Radiculopathy, lumbosacral region: Secondary | ICD-10-CM | POA: Diagnosis not present

## 2018-09-11 DIAGNOSIS — G629 Polyneuropathy, unspecified: Secondary | ICD-10-CM

## 2018-09-11 DIAGNOSIS — D472 Monoclonal gammopathy: Secondary | ICD-10-CM

## 2018-09-11 DIAGNOSIS — G2581 Restless legs syndrome: Secondary | ICD-10-CM

## 2018-09-11 MED ORDER — HORIZANT 600 MG PO TBCR: 600.0000 mg | EXTENDED_RELEASE_TABLET | Freq: Two times a day (BID) | ORAL | 3 refills | Status: DC

## 2018-09-11 MED ORDER — LIDOCAINE 5 % EX OINT: 1.0000 "application " | TOPICAL_OINTMENT | CUTANEOUS | 11 refills | Status: DC | PRN

## 2018-09-11 MED ORDER — IMIPRAMINE HCL 25 MG PO TABS: ORAL_TABLET | ORAL | 4 refills | Status: DC

## 2018-09-11 NOTE — Progress Notes (Signed)
[facility]  PATIENT: Gloria Porter DOB: 1955/12/26  REFERRING DOCTOR OR PCP:  Idamae Schuller SOURCE: paitent  _________________________________   HISTORICAL  CHIEF COMPLAINT:  Chief Complaint  Patient presents with  . Sleep Apnea    Rm. 12.  Missed sleep study due to death in family.  Sts. sleep cycle is off. Husband works 2nd shift so she waits up for him and then they'll watch t.v. Today she didn't go to bed til 6am. Back pain worse, sometimess down left leg. Wants referral to Dr. Arnoldo Morale at [facility]  . FMS  . Back Pain    HISTORY OF PRESENT ILLNESS:  Gloria Porter is a 63 y.o. woman with neuropathy and fibromyalgia.    Update 09/11/2018: She feels her pain is mostly stable .  Her foot dysesthesias (Left >> right) are doing fairly well with Horizant plus Lidocaine ointment.    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  She was having a lot of hand/wrist pain and ankle pain and was diagnosed with mixed connective tissue disorder by Dr. Estanislado Pandy    Methotrexate and plaquenil has helped.   She takes 8 pills MTX every week as higher dose was not well tolerates.  She was seeing Dr. Rochele Pages at [facility] for pain management  She has had lumbar surgery x 2 by Dr. Hal Neer, last one April 2013.   MRi 10/2011 (between the two operations) had shown spinal stenosis at L4L5.    ESI x 2 did not help.    Also she is Cushingoid.    Update 09/10/2017: She was recently diagnosed with mixed connective tissue disorder by Dr. Estanislado Pandy. The ANA has been positive and the ribonucleic protein (ANA) antibody is positive.   Plaquenil was started about 4 months ago and her hand pain and joint pain is less constant.   She may be starting methotrexate soon.   She also has MGUS with a IgG Kappa paraprotein.    Also of note she has a dry mouth  She has foot pain bilaterally that bothers her more when she is at rest or in bed.   Moving will help some.     Lidocaine ointment and wearing socks/footies also help   Horizant also helps the pain/RLS.  She has a h/o OSA.   She uses her old CPAP and she notes she does not snore when she uses it (according to her husband) but she does not feel her sleepiness is any better.       From 01/15/2017: Joint pain:   She is noting more generalized joint pain.  This has worsened over the past year.  Pain is worse in the wrists and fingers and ankles .  Bending her fingers is painful.   She sees Dr. Gerilyn Nestle but no further recommendations were made.   She had a carpal tunnel injection without benefit.     She has been referred to Dr Rochele Pages at [facility] (Pain management) for additional services.  Dilaudid had been prescribed to her previously.     She reports being diagnosed with Sjogren's in the past.   A recent ANA was 1:160 (speckled and homogenous) and her CRP is also elevated.     Polyneuropathy/restless legs:   She reports a burning dysesthetic pain and RLS.   Pain is stabbing in quality. Pain is worse when she is not moving.   Shifting around helps.      Horizant with Lidocaine ointment has helped a lot  for neruopathic pain and RLS     She tolerates her med's well    Etiology is likely related to either IDDM (dx in 1991; IDDM since 2008) or MGUS.    She had an elevated M-spike on SPEP and is seeing Dr. Baird Cancer in [facility].    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    Bone survey was normal.   She has a family history of Multiple Myeloma  OSA/Insomnia:   She has OSA and used to use CPAP.   She stopped for a bad cold last year and had trouble resuming so has not restarted.   She falls asleep easy most nights.  However some nights she is up many hours.   She awakens 3-4 times most nights to urinate.   She often has trouble falling back asleep.    Insomnia initially did better with imipramine but is now not doing as well.     She is sleepy during the day and takes naps.  EPWORTH SLEEPINESS SCALE  On a scale of 0 - 3 what is the  chance of dozing:  Sitting and Reading:   3 Watching TV:    1 Sitting inactive in a public place: 0 Passenger in car for one hour: 3 Lying down to rest in the afternoon: 3 Sitting and talking to someone: 0 Sitting quietly after lunch:  1 In a car, stopped in traffic:  0  Total (out of 24):   11/24   Fatigue:   She had low cortisol and she had an ACTH stim test recently which was reportedly blunted but not abnormal.     Fibromyalgia:  Besides joint pain she also has mylagias. She also has dperession and poor sleep.   .  On her med's pain is usually mild but sometimes intensify.   Her worse pain is in the calves and inner thighs.   Current joint pain is worse in the hands.     She has not needed as much hydrocodone.     She is currently on Horizant, imipramine and meloxicam.     LBP/Neck pain:   More recently, she has had neck pain and wrist pain.   A steroid pack hellped a lot but NSAIDs have not.  She has axial back pain and occasional left leg pain.  She has had lumbar surgery by Dr. Kennon Holter (Neurosurgery) in the past.     She sees Delrae Alfred, PT for physical therapy and feels it might help.    CTS:   She has had bilateral CTR over the past year.     Weight Loss:   Last year, we started phentermine and she lost 15 pounds but regained it back when placed on prednisone.   She stopped before her surgery.   She tolerates it well.    Anxiety/stress:   She gets anxious an notes more stress (her mother in law has cancer and she and her husband travel there a lot).     REVIEW OF SYSTEMS: Constitutional: No fevers, chills, sweats, or change in appetite.  Notes fatigue and sleepiness Eyes: No visual changes, double vision, eye pain Ear, nose and throat: No hearing loss, ear pain, nasal congestion, sore throat Cardiovascular: No chest pain, palpitations Respiratory: No shortness of breath at rest or with exertion.   No wheezes.  Has OSA GastrointestinaI: No nausea, vomiting, diarrhea,  abdominal pain, fecal incontinence Genitourinary: Notes urgency and rare incontiennce Musculoskeletal: as above Integumentary: No rash, pruritus, skin lesions Neurological: as above  Psychiatric: Notes depression and some anxiety Endocrine: No palpitations, diaphoresis, change in appetite, change in weigh or increased thirst Hematologic/Lymphatic: No anemia, purpura, petechiae. Allergic/Immunologic: No itchy/runny eyes, nasal congestion, recent allergic reactions, rashes  ALLERGIES: Allergies  Allergen Reactions  . Atorvastatin   . Codeine Nausea And Vomiting  . Quinapril Hcl     HOME MEDICATIONS:  Current Outpatient Medications:  .  ACCU-CHEK FASTCLIX LANCETS MISC, , Disp: , Rfl:  .  APPLE CIDER VINEGAR PO, Take by mouth daily., Disp: , Rfl:  .  BELSOMRA 20 MG TABS, , Disp: , Rfl:  .  buPROPion (WELLBUTRIN SR) 150 MG 12 hr tablet, Take 150 mg by mouth daily., Disp: , Rfl:  .  Calcium Carbonate-Vitamin D (CALCIUM + D PO), Take 1 tablet by mouth daily., Disp: , Rfl:  .  cetirizine (ZYRTEC) 10 MG tablet, Take 10 mg by mouth., Disp: , Rfl:  .  DEXILANT 60 MG capsule, Take 60 mg by mouth as needed. , Disp: , Rfl:  .  diclofenac sodium (VOLTAREN) 1 % GEL, APP 4 GM TOPICALLY AA QID, Disp: , Rfl:  .  empagliflozin (JARDIANCE) 10 MG TABS tablet, Take 10 mg by mouth., Disp: , Rfl:  .  fluticasone (FLONASE) 50 MCG/ACT nasal spray, Place 2 sprays into both nostrils daily., Disp: 16 g, Rfl: 2 .  folic acid (FOLVITE) 1 MG tablet, Take 2 tablets (2 mg total) by mouth daily., Disp: 180 tablet, Rfl: 3 .  FREESTYLE LITE test strip, daily., Disp: , Rfl:  .  furosemide (LASIX) 40 MG tablet, Take 40 mg by mouth daily., Disp: , Rfl:  .  HORIZANT 600 MG TBCR, Take 1 tablet (600 mg total) by mouth 2 (two) times daily., Disp: 180 tablet, Rfl: 3 .  hydroxychloroquine (PLAQUENIL) 200 MG tablet, TAKE 1 TABLET TWICE A DAY, Disp: 180 tablet, Rfl: 0 .  imipramine (TOFRANIL) 25 MG tablet, Take one or two po  at bedtime, Disp: 180 tablet, Rfl: 4 .  Insulin Regular Human (HUMULIN R U-500, CONCENTRATED, [place]), Inject 20 Units into the skin 3 (three) times daily as needed., Disp: , Rfl:  .  lidocaine (XYLOCAINE) 5 % ointment, Apply 1 application topically as needed., Disp: 50 g, Rfl: 11 .  LORazepam (ATIVAN) 1 MG tablet, as needed., Disp: , Rfl: 5 .  losartan (COZAAR) 50 MG tablet, , Disp: , Rfl:  .  methotrexate (RHEUMATREX) 2.5 MG tablet, TAKE 10 TABLETS (25 MG) ONCE A WEEK. CAUTION, CHEMOTHERAPY. PROTECT FROM LIGHT, Disp: 120 tablet, Rfl: 0 .  montelukast (SINGULAIR) 10 MG tablet, Take 10 mg by mouth at bedtime., Disp: , Rfl:  .  promethazine (PHENERGAN) 25 MG tablet, Take 1 tablet (25 mg total) by mouth at bedtime as needed for nausea or vomiting., Disp: 30 tablet, Rfl: 0 .  Semaglutide (OZEMPIC) 0.25 or 0.5 MG/DOSE SOPN, Inject into the skin every Saturday., Disp: , Rfl:  .  thyroid (ARMOUR) 240 MG tablet, Take by mouth., Disp: , Rfl:  .  losartan (COZAAR) 50 MG tablet, Take 50 mg by mouth., Disp: , Rfl:   PAST MEDICAL HISTORY: Past Medical History:  Diagnosis Date  . Anxiety   . Arthritis   . 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 Diabetes mellitus    insulin pump.followed dr Peyton Bottoms [facility]  . Difficult intubation    2011 scratched trachea  . Fibromyalgia   . GERD (gastroesophageal reflux disease)   . Headache(784.0)   . Hypothyroidism   .  Lumbar disc disease   . Neuropathy   . Sjogren's disease ([place])   . Sleep apnea    no cpap.sleep study 2006 [facility]    PAST SURGICAL HISTORY: Past Surgical History:  Procedure Laterality Date  . APPENDECTOMY    . BACK SURGERY     lumbar fusion  . BRAIN SURGERY    . CESAREAN SECTION    . ENDOMETRIAL ABLATION    . FRACTURE SURGERY     l arm  . LUMBAR LAMINECTOMY/DECOMPRESSION MICRODISCECTOMY  11/13/2011   Procedure: LUMBAR LAMINECTOMY/DECOMPRESSION MICRODISCECTOMY;  Surgeon: Faythe Ghee, MD;  Location:  [facility];  Service: Neurosurgery;  Laterality: Bilateral;  Bilateral Lumbar four-five Decompression  . TONSILLECTOMY    . TRANSPHENOIDAL / TRANSNASAL HYPOPHYSECTOMY / RESECTION PITUITARY TUMOR    . TRIGGER FINGER RELEASE    . TUBAL LIGATION      FAMILY HISTORY: Family History  Problem Relation Age of Onset  . Diabetes type II Mother   . Diabetes type II Father   . Heart disease Father   . Heart attack Father   . Colon cancer Maternal Grandmother   . Heart disease Maternal Grandfather     SOCIAL HISTORY:  Social History   Socioeconomic History  . Marital status: Married    Spouse name: Not on file  . Number of children: Not on file  . Years of education: Not on file  . Highest education level: Not on file  Occupational History  . Not on file  Social Needs  . Financial resource strain: Not on file  . Food insecurity:    Worry: Not on file    Inability: Not on file  . Transportation needs:    Medical: Not on file    Non-medical: Not on file  Tobacco Use  . Smoking status: Former Smoker    Packs/day: 1.00    Years: 32.00    Pack years: 32.00    Types: Cigarettes    Last attempt to quit: 07/27/2006    Years since quitting: 12.1  . Smokeless tobacco: Never Used  Substance and Sexual Activity  . Alcohol use: Yes    Alcohol/week: 1.0 standard drinks    Types: 1 Glasses of wine per week    Comment: rarely  . Drug use: No  . Sexual activity: Not on file  Lifestyle  . Physical activity:    Days per week: Not on file    Minutes per session: Not on file  . Stress: Not on file  Relationships  . Social connections:    Talks on phone: Not on file    Gets together: Not on file    Attends religious service: Not on file    Active member of club or organization: Not on file    Attends meetings of clubs or organizations: Not on file    Relationship status: Not on file  . Intimate partner violence:    Fear of current or ex partner: Not on file    Emotionally abused:  Not on file    Physically abused: Not on file    Forced sexual activity: Not on file  Other Topics Concern  . Not on file  Social History Narrative  . Not on file     PHYSICAL EXAM  Vitals:   09/11/18 1547  BP: 134/72  Pulse: 82  Resp: 18  Weight: 261 lb 8 oz (118.6 kg)  Height: _0  (1.6 m)    Body mass index is 46.32 kg/m.  General: The patient is an obese woman in NAD.      Skin: Extremities are without significant edema.  Musculoskeletal: She has some tenderness over the classic fibromyalgia tender points of the upper chest and back.  Mild joint enlargement in the hands     Neurologic Exam  Mental status: The patient is alert and oriented x 3 at the time of the examination. The patient has apparent normal recent and remote memory, with an apparently normal attention span and concentration ability.   Speech is normal.  Cranial nerves: Extraocular movements are full.   There is good facial sensation to soft touch bilaterally.Facial strength is normal.  Trapezius and sternocleidomastoid strength is normal. No dysarthria is noted.  The tongue is midline, and the patient has symmetric elevation of the soft palate. No obvious hearing deficits are noted.  Motor:  Muscle bulk is normal.   Tone is normal. Strength is  5 / 5 in all 4 extremities.   Sensory: She has intact sensation to touch and vibration in the arms and hands.  She has reduced vibration sensation at the toes.  There is also dysesthesia over the left L5 distribution and some reduce touch sensation in both feet.  Coordination: Cerebellar testing reveals good finger-nose-finger and heel-to-shin  Gait and station: Station is normal.   Gait is normal.  Tandem gait is mildly wide.  Romberg is negative.   Reflexes: Deep tendon reflexes are symmetric and 1+ bilaterally.        DIAGNOSTIC DATA (LABS, IMAGING, TESTING) - I reviewed patient records, labs, notes, testing and imaging myself where available.  Lab  Results  Component Value Date   WBC 5.8 07/25/2018   HGB 13.4 07/25/2018   HCT 40.9 07/25/2018   MCV 88.1 07/25/2018   PLT 199 07/25/2018      Component Value Date/Time   NA 139 07/25/2018 1555   K 4.5 07/25/2018 1555   CL 101 07/25/2018 1555   CO2 29 07/25/2018 1555   GLUCOSE 57 (L) 07/25/2018 1555   BUN 15 07/25/2018 1555   CREATININE 0.64 07/25/2018 1555   CALCIUM 9.4 07/25/2018 1555   PROT 7.3 07/25/2018 1555   ALBUMIN 3.5 (L) 03/14/2017 0922   AST 22 07/25/2018 1555   ALT 20 07/25/2018 1555   ALKPHOS 82 03/14/2017 0922   BILITOT 0.4 07/25/2018 1555   GFRNONAA 96 07/25/2018 1555   GFRAA 111 07/25/2018 1555      ASSESSMENT AND PLAN  Lumbosacral radiculopathy at L5 - Plan: MR Lumbar Spine W Wo Contrast  DDD (degenerative disc disease), lumbar - Plan: MR Lumbar Spine W Wo Contrast  History of lumbar surgery - Plan: MR Lumbar Spine W Wo Contrast  Mixed connective tissue disease (HCC)  MGUS (monoclonal gammopathy of unknown significance)  Restless leg  Neuropathy    1.    She will continue Horizant and lidocaine ointment for the polyneuropathy and restless leg syndrome.   And also continue imipramine for the insomnia and polyneuropathy and nocturia 2.   She is having more difficulties with her back and left greater than right leg pain.  We need to check an MRI of the lumbar spine with and without contrast to determine if there is been worsening degenerative changes and also assess for epidural fibrosis.    Based on the results we will consider referral to neurosurgery for further evaluation.  She has had lumbar surgery twice for L4L5 3.    She is advised to stay active and exercises  as tolerated.  4.   RTC 12 months, sooner if she has new or worsening neurologic symptoms.   Jeff Frieden A. Felecia Shelling, MD, PhD 8/40/3754, 3:60 PM Certified in Neurology, Clinical Neurophysiology, Sleep Medicine, Pain Medicine and Neuroimaging  [facility] [address]  [place], [place] [address] [phone]  7

## 2018-09-12 ENCOUNTER — Telehealth: Payer: Self-pay | Admitting: Neurology

## 2018-09-12 NOTE — Telephone Encounter (Signed)
[employer] order sent GI lvm for pt to aware

## 2018-09-23 ENCOUNTER — Ambulatory Visit (INDEPENDENT_AMBULATORY_CARE_PROVIDER_SITE_OTHER): Admitting: Rheumatology

## 2018-09-23 ENCOUNTER — Telehealth: Payer: Self-pay | Admitting: Pharmacist

## 2018-09-23 ENCOUNTER — Encounter: Payer: Self-pay | Admitting: Rheumatology

## 2018-09-23 VITALS — BP 130/61 | HR 89 | Resp 18 | Ht 63.0 in | Wt 264.2 lb

## 2018-09-23 DIAGNOSIS — M797 Fibromyalgia: Secondary | ICD-10-CM

## 2018-09-23 DIAGNOSIS — M170 Bilateral primary osteoarthritis of knee: Secondary | ICD-10-CM

## 2018-09-23 DIAGNOSIS — Z79899 Other long term (current) drug therapy: Secondary | ICD-10-CM

## 2018-09-23 DIAGNOSIS — D472 Monoclonal gammopathy: Secondary | ICD-10-CM

## 2018-09-23 DIAGNOSIS — M351 Other overlap syndromes: Secondary | ICD-10-CM

## 2018-09-23 DIAGNOSIS — Z8639 Personal history of other endocrine, nutritional and metabolic disease: Secondary | ICD-10-CM

## 2018-09-23 DIAGNOSIS — R002 Palpitations: Secondary | ICD-10-CM

## 2018-09-23 DIAGNOSIS — Z87448 Personal history of other diseases of urinary system: Secondary | ICD-10-CM

## 2018-09-23 DIAGNOSIS — M19041 Primary osteoarthritis, right hand: Secondary | ICD-10-CM

## 2018-09-23 DIAGNOSIS — M5136 Other intervertebral disc degeneration, lumbar region: Secondary | ICD-10-CM

## 2018-09-23 DIAGNOSIS — R234 Changes in skin texture: Secondary | ICD-10-CM | POA: Diagnosis not present

## 2018-09-23 DIAGNOSIS — M3501 Sicca syndrome with keratoconjunctivitis: Secondary | ICD-10-CM | POA: Diagnosis not present

## 2018-09-23 DIAGNOSIS — Z87898 Personal history of other specified conditions: Secondary | ICD-10-CM

## 2018-09-23 DIAGNOSIS — Z8659 Personal history of other mental and behavioral disorders: Secondary | ICD-10-CM

## 2018-09-23 DIAGNOSIS — M19042 Primary osteoarthritis, left hand: Secondary | ICD-10-CM

## 2018-09-23 MED ORDER — HYDROXYCHLOROQUINE SULFATE 200 MG PO TABS: 200.0000 mg | ORAL_TABLET | Freq: Two times a day (BID) | ORAL | 0 refills | Status: DC

## 2018-09-23 MED ORDER — PROMETHAZINE HCL 25 MG PO TABS: 25.0000 mg | ORAL_TABLET | Freq: Every evening | ORAL | 0 refills | Status: DC | PRN

## 2018-09-23 NOTE — Progress Notes (Signed)
Pharmacy Note  Patient presents to [facility] orthopedics for follow-up appointment for mixed connective tissue disease and Sjogren's syndrome.  She is currently on methotrexate 8 tablets weekly and is having difficulty tolerating due to GI side effects.  She is also been prescribed Phenergan to help with GI upset with minimal response.  The plan is to switch her to injectable methotrexate to help decrease GI side effects.  Medication Samples have been provided to the patient.  Drug name: Rasuvo  Strength: 25 mg      Qty: 1  LOT: [id number]   Exp.Date: 04/2019  Dosing instructions: Inject 1 pen weekly  The patient has been instructed regarding the correct time, dose, and frequency of taking this medication, including desired effects and most common side effects. She was shown proper administration using the [employer].   We will apply for Rasuvo through insurance.  Informed patient that insurance does not always cover methotrexate pen devices and she may have to use vial and syringe.  Patient is a diabetic and has used vial and syringe insulin previously and feels comfortable administering at home.  We will update when we receive a response.  All questions encouraged and answered.  Instructed patient to call with any further questions or concerns.  Mariella Saa, PharmD, [facility] Rheumatology Clinical Pharmacist  09/23/2018 3:36 PM

## 2018-09-23 NOTE — Telephone Encounter (Signed)
Please start BIV for Gloria Porter.  Patient is not tolerating oral tablets well even with Phenergan as needed.

## 2018-09-23 NOTE — Patient Instructions (Signed)
Standing Labs We placed an order today for your standing lab work.    Please come back and get your standing labs in March and every 3 months  We have open lab Monday through Friday from 8:30-11:30 AM and 1:30-4:00 PM  at the office of Dr. Shaili Deveshwar.   You may experience shorter wait times on Monday and Friday afternoons. The office is located at [address], [address], [place], [place] [address] No appointment is necessary.   Labs are drawn by [facility].  You may receive a bill from [facility] for your lab work.  If you wish to have your labs drawn at another location, please call the office 24 hours in advance to send orders.  If you have any questions regarding directions or hours of operation,  please call [phone].   Just as a reminder please drink plenty of water prior to coming for your lab work. Thanks!  

## 2018-09-26 ENCOUNTER — Other Ambulatory Visit: Payer: Self-pay | Admitting: Rheumatology

## 2018-09-26 ENCOUNTER — Telehealth: Payer: Self-pay | Admitting: Rheumatology

## 2018-09-26 ENCOUNTER — Ambulatory Visit
Admission: RE | Admit: 2018-09-26 | Discharge: 2018-09-26 | Disposition: A | Discharge status: Home / Self Care | Source: Ambulatory Visit | Attending: Neurology | Admitting: Neurology

## 2018-09-26 DIAGNOSIS — Z9889 Other specified postprocedural states: Secondary | ICD-10-CM

## 2018-09-26 DIAGNOSIS — M5417 Radiculopathy, lumbosacral region: Secondary | ICD-10-CM

## 2018-09-26 DIAGNOSIS — M5136 Other intervertebral disc degeneration, lumbar region: Secondary | ICD-10-CM

## 2018-09-26 MED ORDER — GADOBENATE DIMEGLUMINE 529 MG/ML IV SOLN
20.0000 mL | Freq: Once | INTRAVENOUS | Status: AC | PRN
  Administered 2018-09-26: 20 mL via INTRAVENOUS

## 2018-09-26 MED ORDER — HYDROXYCHLOROQUINE SULFATE 200 MG PO TABS: 200.0000 mg | ORAL_TABLET | Freq: Two times a day (BID) | ORAL | 0 refills | Status: DC

## 2018-09-26 NOTE — Telephone Encounter (Signed)
Last Visit: 09/23/18 Next Visit: 02/18/19  Okay to refill per Dr. Estanislado Pandy

## 2018-09-26 NOTE — Telephone Encounter (Signed)
Patient states her prescription of Plaquenil was sent to [facility], but it needs to be sent to [facility].  Patient states she will call [facility] and cancel the order.  Patient states she spoke with [employer] and they will approve Methotrexate single injection pens which will also need to be sent to [facility].

## 2018-09-26 NOTE — Telephone Encounter (Signed)
Left message to advise patient prescription for PLQ has been sent to [facility]. Advised awaiting the BIV for Rasuvo. Once it has been approved we will be able to send it to the pharmacy.

## 2018-09-30 ENCOUNTER — Telehealth: Payer: Self-pay | Admitting: *Deleted

## 2018-09-30 DIAGNOSIS — M5417 Radiculopathy, lumbosacral region: Secondary | ICD-10-CM

## 2018-09-30 DIAGNOSIS — M48061 Spinal stenosis, lumbar region without neurogenic claudication: Secondary | ICD-10-CM

## 2018-09-30 NOTE — Telephone Encounter (Signed)
Pt returned RN's call. She said Dr Hal Neer has retired. She is wanting referral sent to Dr Arnoldo Morale in the same practice.

## 2018-09-30 NOTE — Telephone Encounter (Signed)
Placed referral  

## 2018-09-30 NOTE — Telephone Encounter (Signed)
Called, LVM (ok per DPR) relaying results per Dr. Felecia Shelling note. Asked her to call back to let us know if she would like neurosurgery referral to go to Dr. Hal Neer. Gave [facility] phone number.

## 2018-09-30 NOTE — Telephone Encounter (Signed)
-----   Message from Britt Bottom, MD sent at 09/29/2018  5:56 PM EST ----- Please let her know that the MRI of the lumbar spine she had a couple days ago looks about the same as the one from 2017.  It shows moderate spinal stenosis at L4-L5 and that is likely the source of her pain.   Since she had not received a benefit from epidural steroid injections, it would be reasonable for her to see neurosurgery again.  She has seen Dr. Hal Neer in the past.  Would she like [place] to refer her back?

## 2018-10-01 NOTE — Telephone Encounter (Signed)
Received a Prior Authorization request from [employer] for [place]. Authorization has been submitted to patient's insurance via Cover My Meds. Will update once we receive a response.

## 2018-10-01 NOTE — Telephone Encounter (Signed)
Received a fax from [place] regarding a prior authorization for [place]. Authorization has been APPROVED from 09/01/2018 to 08/12/2098.   Will send document to scan center.  Authorization # [id number]   Ran test claim- 1 month supply is $60, patient is copay card eligible.

## 2018-10-02 MED ORDER — METHOTREXATE (PF) 25 MG/0.5ML ~~LOC~~ SOAJ: 25.0000 mg | SUBCUTANEOUS | 0 refills | Status: DC

## 2018-10-02 NOTE — Telephone Encounter (Signed)
Left voicemail informing patient of approval and eligible for co-pay card.  Asked for a return call for where she would like [place] to send the prescription.

## 2018-10-02 NOTE — Addendum Note (Signed)
Addended by: Mariella Saa C on: 10/02/2018 10:51 AM   Modules accepted: Orders

## 2018-10-02 NOTE — Telephone Encounter (Signed)
Patient returned my call.  She requested prescription be sent to [facility].    All questions encouraged and answered.  Instructed patient to call with any further questions or concerns.  Mariella Saa, PharmD, [facility] Rheumatology Clinical Pharmacist  10/02/2018 10:45 AM

## 2018-10-23 ENCOUNTER — Telehealth: Payer: Self-pay | Admitting: Nurse Practitioner

## 2018-10-23 ENCOUNTER — Other Ambulatory Visit: Payer: Self-pay | Admitting: Neurosurgery

## 2018-10-23 DIAGNOSIS — M545 Low back pain, unspecified: Secondary | ICD-10-CM

## 2018-10-23 DIAGNOSIS — G8929 Other chronic pain: Secondary | ICD-10-CM

## 2018-10-23 NOTE — Telephone Encounter (Signed)
Phone call to patient to verify medication list and allergies for myelogram procedure. Pt instructed to hold Phenergan, Wellbutrin and Tofranil for 48hrs prior to myelogram appointment time. Pt verbalized understanding.

## 2018-10-31 ENCOUNTER — Other Ambulatory Visit: Payer: Self-pay

## 2018-10-31 MED ORDER — HYDROXYCHLOROQUINE SULFATE 200 MG PO TABS: 200.0000 mg | ORAL_TABLET | Freq: Two times a day (BID) | ORAL | 0 refills | Status: DC

## 2018-10-31 NOTE — Progress Notes (Signed)
Okay to refill PLQ per Dr. Estanislado Pandy. Patient has been notified.

## 2018-11-04 ENCOUNTER — Other Ambulatory Visit

## 2018-12-02 ENCOUNTER — Other Ambulatory Visit

## 2018-12-02 ENCOUNTER — Inpatient Hospital Stay: Admission: RE | Admit: 2018-12-02 | Source: Ambulatory Visit

## 2018-12-30 ENCOUNTER — Telehealth: Payer: Self-pay

## 2018-12-30 NOTE — Telephone Encounter (Signed)
Spoke with patient after she returned our call to screen her medications and allergies prior to being scheduled for a myelogram.  She understands she needs to hold Excedrine for three days and Phenergan, Tofranil and Wellbutrin for two days before and one day after her myelogram.  She was told she will be here 2-2.5 hours, will need a driver and will to be on strict bedrest for 24 hours after the procedure.

## 2019-01-07 ENCOUNTER — Other Ambulatory Visit: Payer: Self-pay | Admitting: *Deleted

## 2019-01-07 DIAGNOSIS — Z79899 Other long term (current) drug therapy: Secondary | ICD-10-CM

## 2019-01-08 LAB — COMPLETE METABOLIC PANEL WITH GFR
AG Ratio: 1.2 (calc) (ref 1.0–2.5)
ALT: 23 U/L (ref 6–29)
AST: 31 U/L (ref 10–35)
Albumin: 3.8 g/dL (ref 3.6–5.1)
Alkaline phosphatase (APISO): 71 U/L (ref 37–153)
BUN: 12 mg/dL (ref 7–25)
CO2: 25 mmol/L (ref 20–32)
Calcium: 9.6 mg/dL (ref 8.6–10.4)
Chloride: 104 mmol/L (ref 98–110)
Creat: 0.68 mg/dL (ref 0.50–0.99)
GFR, Est African American: 109 mL/min/{1.73_m2} (ref >=60)
GFR, Est Non African American: 94 mL/min/{1.73_m2} (ref >=60)
Globulin: 3.3 g/dL (calc) (ref 1.9–3.7)
Glucose, Bld: 148 mg/dL — ABNORMAL HIGH (ref 65–99)
Potassium: 4.4 mmol/L (ref 3.5–5.3)
Sodium: 140 mmol/L (ref 135–146)
Total Bilirubin: 0.4 mg/dL (ref 0.2–1.2)
Total Protein: 7.1 g/dL (ref 6.1–8.1)

## 2019-01-08 LAB — CBC WITH DIFFERENTIAL/PLATELET
Absolute Monocytes: 689 {cells}/uL (ref 200–950)
Basophils Absolute: 50 {cells}/uL (ref 0–200)
Basophils Relative: 0.7 %
Eosinophils Absolute: 178 {cells}/uL (ref 15–500)
Eosinophils Relative: 2.5 %
HCT: 42.3 % (ref 35.0–45.0)
Hemoglobin: 13.8 g/dL (ref 11.7–15.5)
Lymphs Abs: 1172 {cells}/uL (ref 850–3900)
MCH: 29.4 pg (ref 27.0–33.0)
MCHC: 32.6 g/dL (ref 32.0–36.0)
MCV: 90 fL (ref 80.0–100.0)
MPV: 11 fL (ref 7.5–12.5)
Monocytes Relative: 9.7 %
Neutro Abs: 5013 {cells}/uL (ref 1500–7800)
Neutrophils Relative %: 70.6 %
Platelets: 197 Thousand/uL (ref 140–400)
RBC: 4.7 Million/uL (ref 3.80–5.10)
RDW: 16.6 % — ABNORMAL HIGH (ref 11.0–15.0)
Total Lymphocyte: 16.5 %
WBC: 7.1 Thousand/uL (ref 3.8–10.8)

## 2019-01-08 NOTE — Progress Notes (Signed)
Office Visit Note  Patient: Gloria Porter             Date of Birth: [birth year]           MRN: [id number]             PCP: Florestine Avers (Inactive) Referring: No ref. provider found Visit Date: 01/22/2019 Occupation: @GUAROCC @  Subjective:  Discuss medications    History of Present Illness: Gloria Porter is a 63 y.o. female with history of mixed connective tissue disease, osteoarthritis, Sjogren's, and fibromyalgia.  She continues to take Plaquenil 200 mg 1 tablet twice daily.  She discontinued methotrexate about 3 weeks ago.  She states that she tried taking Rasuvo for about 7 weeks but continues to have severe nausea and occasional vomiting after injecting.  She usually injection on Sundays and was nauseous until Wednesday.  She said she tried going back on the tablets and continued to have nausea and intermittent vomiting.  She does not want to continue on methotrexate at this point.  She denies any increased joint pain or joint swelling since having off of methotrexate.  She has intermittent pain in bilateral hands and bilateral wrist joints.  She denies any joint swelling.  She states that she has noticed some decreased grip strength.  She denies any recent rashes or photosensitivity.  She does experience symptoms of Raynaud's in her feet.  She states that several months ago she had nasal ulcerations but denies any oral ulcerations.  She has chronic sicca symptoms due to Sjogren's.  She states her sicca symptoms have been stable.  She has chronic lower back pain.  She is scheduled for a fusion of L5-S1 by Dr. Arnoldo Morale on 03/02/2019.  She is experiencing sciatica bilaterally.  She continues to have generalized muscle aches and muscle tenderness in her lower extremities due to fibromyalgia.  She has chronic fatigue but it has been stable recently.     Activities of Daily Living:  Patient reports morning stiffness for 30 minutes.   Patient Reports nocturnal pain.  Difficulty  dressing/grooming: Reports Difficulty climbing stairs: Reports Difficulty getting out of chair: Reports Difficulty using hands for taps, buttons, cutlery, and/or writing: Reports  Review of Systems  Constitutional: Negative for fatigue.  HENT: Positive for mouth dryness. Negative for mouth sores and nose dryness.   Eyes: Positive for pain and dryness. Negative for visual disturbance.  Respiratory: Negative for cough, hemoptysis, shortness of breath, wheezing and difficulty breathing.   Cardiovascular: Positive for swelling in legs/feet. Negative for chest pain, palpitations and hypertension.  Gastrointestinal: Positive for constipation. Negative for abdominal pain, blood in stool and diarrhea.  Endocrine: Negative for excessive thirst and increased urination.  Genitourinary: Negative for painful urination.  Musculoskeletal: Positive for arthralgias, joint pain, joint swelling and morning stiffness. Negative for myalgias, muscle weakness, muscle tenderness and myalgias.  Skin: Negative for color change, pallor, rash, hair loss, nodules/bumps, redness, skin tightness, ulcers and sensitivity to sunlight.  Allergic/Immunologic: Negative for susceptible to infections.  Neurological: Negative for numbness, headaches and weakness.  Hematological: Negative for bruising/bleeding tendency and swollen glands.  Psychiatric/Behavioral: Positive for sleep disturbance. Negative for depressed mood. The patient is not nervous/anxious.     PMFS History:  Patient Active Problem List   Diagnosis Date Noted   Primary osteoarthritis of both hands 04/10/2017   Primary osteoarthritis of both knees 04/10/2017   DDD (degenerative disc disease), lumbar 04/10/2017   Mixed connective tissue disease ([place]) 04/10/2017   S/P trigger finger  release bilateral third 03/14/2017   MGUS (monoclonal gammopathy of unknown significance) 03/11/2017   History of chronic kidney disease 03/11/2017   Monoclonal  paraproteinemia 01/11/2016   Restless leg 01/11/2015   Carpal tunnel syndrome 12/28/2014   Dysphagia, oropharyngeal 12/28/2014   Anxiety, generalized 10/14/2014   Generalized joint pain 08/17/2014   Encounter for therapeutic drug monitoring 07/13/2014   Failed back syndrome of lumbar spine 07/13/2014   Encounter for therapeutic drug level monitoring 07/13/2014   ACTH excess, central ([place]) 06/16/2014   Diabetes ([place]) 06/16/2014   Fibrositis 06/16/2014   Cannot sleep 06/16/2014   Extreme obesity 06/16/2014   Neuropathy 06/16/2014   Allergic rhinitis, seasonal 06/16/2014   Post menopausal syndrome 06/16/2014   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   Spinal stenosis 06/16/2014   Adult hypothyroidism 06/16/2014   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[place]) 06/16/2014   Diabetes mellitus ([place]) 06/16/2014   Fibromyalgia 06/16/2014   Morbid obesity ([place]) 06/16/2014   Sjogren's syndrome ([place]) 06/16/2014   Postmenopausal HRT (hormone replacement therapy) 06/16/2014   Breath shortness 01/21/2014   Essential (primary) hypertension 01/21/2014   Acid reflux 01/21/2014   Adiposity 01/21/2014   Clinical depression 08/26/2013   Obstructive apnea 01/17/2013   Cushing's syndrome ([place]) 05/23/2011   Pituitary adenoma ([place]) 05/23/2011    Past Medical History:  Diagnosis Date   Anxiety    Arthritis    Cushing's disease ([place])    pituitary tumor removed 2012   Diabetes mellitus    insulin pump.followed dr Peyton Bottoms [email or web address]   Difficult intubation    2011 scratched trachea   Fibromyalgia    GERD (gastroesophageal reflux disease)    Headache(784.0)    Hypothyroidism    Lumbar disc disease    Neuropathy    Sjogren's disease ([place])    Sleep apnea    no cpap.sleep study 2006 [facility]    Family History  Problem Relation Age of Onset   Diabetes type II Mother    Diabetes type II Father    Heart disease Father    Heart attack Father      Colon cancer Maternal Grandmother    Heart disease Maternal Grandfather    Past Surgical History:  Procedure Laterality Date   APPENDECTOMY     BACK SURGERY     lumbar fusion   BRAIN SURGERY     CESAREAN SECTION     ENDOMETRIAL ABLATION     FRACTURE SURGERY     l arm   LUMBAR LAMINECTOMY/DECOMPRESSION MICRODISCECTOMY  11/13/2011   Procedure: LUMBAR LAMINECTOMY/DECOMPRESSION MICRODISCECTOMY;  Surgeon: Faythe Ghee, MD;  Location: [facility];  Service: Neurosurgery;  Laterality: Bilateral;  Bilateral Lumbar four-five Decompression   TONSILLECTOMY     TRANSPHENOIDAL / TRANSNASAL HYPOPHYSECTOMY / RESECTION PITUITARY TUMOR     TRIGGER FINGER RELEASE     TUBAL LIGATION     Social History   Social History Narrative   Not on file   Immunization History  Administered Date(s) Administered   Influenza Inj Mdck Quad Pf 07/12/2017, 07/14/2018   Influenza Split 06/13/2017   Pneumococcal Conjugate-13 12/28/2014   Pneumococcal Polysaccharide-23 01/03/2017     Objective: Vital Signs: BP (!) 113/55 (BP Location: Left Arm, Patient Position: Sitting, Cuff Size: Large)    Pulse 74    Resp 12    Ht 5\' 3"  (1.6 m)    Wt 256 lb (116.1 kg)    LMP 11/08/2006    BMI 45.35 kg/m    Physical Exam Vitals signs and nursing note reviewed.  Constitutional:      Appearance: She is well-developed.  HENT:     Head: Normocephalic and atraumatic.  Eyes:     Conjunctiva/sclera: Conjunctivae normal.  Neck:     Musculoskeletal: Normal range of motion.  Cardiovascular:     Rate and Rhythm: Normal rate and regular rhythm.     Heart sounds: Normal heart sounds.  Pulmonary:     Effort: Pulmonary effort is normal.     Breath sounds: Normal breath sounds.  Abdominal:     General: Bowel sounds are normal.     Palpations: Abdomen is soft.  Lymphadenopathy:     Cervical: No cervical adenopathy.  Skin:    General: Skin is warm and dry.     Capillary Refill: Capillary refill takes less  than 2 seconds.  Neurological:     Mental Status: She is alert and oriented to person, place, and time.  Psychiatric:        Behavior: Behavior normal.      Musculoskeletal Exam: C-spine, thoracic spine, and lumbar spine good ROM.  No midline spinal tenderness.  No SI joint tenderness.  Shoulder joints good ROM.  Right elbow mild contracture.  Left elbow joint good ROM.  Wrist joints, MCPs, PIPs, and DIPs good ROM with no synovitis.  She has PIP synovial thickening consistent with osteoarthritis of bilateral hands.  Hip joints, knee joints, ankle joints, MCPs, PIPs and DIPs good range of motion no synovitis.  She has bilateral knee crepitus.  No warmth or effusion of knee joints noted.  No tenderness or swelling of ankle joints.  She has pedal edema bilaterally.  CDAI Exam: CDAI Score: -- Patient Global: --; Provider Global: -- Swollen: --; Tender: -- Joint Exam   No joint exam has been documented for this visit   There is currently no information documented on the homunculus. Go to the Rheumatology activity and complete the homunculus joint exam.  Investigation: No additional findings.  Imaging: Ct Lumbar Spine W Contrast  Result Date: 01/14/2019 CLINICAL DATA:  Chronic low back pain radiating into both lateral thighs. EXAM: LUMBAR MYELOGRAM CT LUMBAR MYELOGRAM FLUOROSCOPY TIME:  Radiation Exposure Index (as provided by the fluoroscopic device): 17.3 mGy Fluoroscopy Time:  16 seconds Number of Acquired Images:  17 PROCEDURE: After thorough discussion of risks and benefits of the procedure including bleeding, infection, injury to nerves, blood vessels, adjacent structures as well as headache and CSF leak, written and oral informed consent was obtained. Consent was obtained by Dr. Fabiola Backer. Time out form was completed. Patient was positioned prone on the fluoroscopy table. Local anesthesia was provided with 1% lidocaine without epinephrine after prepped and draped in the usual sterile  fashion. Puncture was performed at L2-L3 using a 5 inch 22-gauge spinal needle via right paramedian approach. Using a single pass through the dura, the needle was placed within the thecal sac, with return of clear CSF. 15 mL of Isovue M-200 was injected into the thecal sac, with normal opacification of the nerve roots and cauda equina consistent with free flow within the subarachnoid space. I personally performed the lumbar puncture and administered the intrathecal contrast. I also personally supervised acquisition of the myelogram images. TECHNIQUE: Contiguous axial images were obtained through the lumbar spine after the intrathecal infusion of contrast. Coronal and sagittal reconstructions were obtained of the axial image sets. COMPARISON:  MRI lumbar spine dated September 26, 2018. CT lumbar spine dated Dec 17, 2014. CT lumbar myelogram dated March 07, 2010. FINDINGS: LUMBAR MYELOGRAM  FINDINGS: Unchanged 3 mm anterolisthesis at L4-L5 when prone, which increases to 6 mm with standing. Unchanged trace anterolisthesis at L5-S1 when prone, which increases to 5 mm with standing. Prior left unilateral posterior and interbody fusion at L4-L5 with increased lucency about the L5 pedicle screw. Prominent ventral extradural defect at L4-L5 with moderate spinal canal stenosis. CT LUMBAR MYELOGRAM FINDINGS: Segmentation: 5 lumbar type vertebral bodies. Alignment: Unchanged 3 mm anterolisthesis at L4-L5 and trace anterolisthesis at L5-S1. Vertebrae: Prior left unilateral posterior and interbody fusion at L4-L5 with increased lucency about the L5 pedicle screw. Progressive endplate cystic changes surrounding the interbody graft with unchanged mild subsidence of the graft into the L5 superior endplate. No acute fracture or other focal pathologic process. Conus medullaris and cauda equina: Conus extends to the L2 level. Conus and cauda equina appear normal. Paraspinal and other soft tissues: Aortoiliac atherosclerotic vascular  disease. Disc levels: T12-L1:  Negative. L1-L2:  Negative. L2-L3:  Unchanged minimal disc bulging.  No stenosis. L3-L4: Unchanged minimal disc bulging and mild bilateral facet arthropathy with ligamentum flavum hypertrophy. Unchanged mild bilateral neuroforaminal stenosis. No spinal canal stenosis. L4-L5: Prior posterior decompression and fusion. Unchanged prominent posterior endplate spurring and moderate bilateral facet arthropathy resulting in moderate spinal canal stenosis and severe bilateral neuroforaminal stenosis. L5-S1: Unchanged broad-based posterior disc protrusion eccentric to the left with severe bilateral facet arthropathy. Unchanged mild left lateral recess stenosis, severe left neuroforaminal stenosis, and moderate right neuroforaminal stenosis. IMPRESSION: 1. Prior posterior decompression and left unilateral fusion at L4-L5 with likely pseudoarthrosis and loosening of the L5 pedicle screw, mildly progressed since prior CT from May 2016. Unchanged moderate spinal canal and severe bilateral neuroforaminal stenosis at this level. 2. Unchanged severe left and moderate right neuroforaminal stenosis at L5-S1. 3. Dynamic anterolisthesis at L4-L5 and L5-S1. Electronically Signed   By: Titus Dubin M.D.   On: 01/14/2019 15:01   Dg Myelography Lumbar Inj Lumbosacral  Result Date: 01/14/2019 CLINICAL DATA:  Chronic low back pain radiating into both lateral thighs. EXAM: LUMBAR MYELOGRAM CT LUMBAR MYELOGRAM FLUOROSCOPY TIME:  Radiation Exposure Index (as provided by the fluoroscopic device): 17.3 mGy Fluoroscopy Time:  16 seconds Number of Acquired Images:  17 PROCEDURE: After thorough discussion of risks and benefits of the procedure including bleeding, infection, injury to nerves, blood vessels, adjacent structures as well as headache and CSF leak, written and oral informed consent was obtained. Consent was obtained by Dr. Fabiola Backer. Time out form was completed. Patient was positioned prone on the  fluoroscopy table. Local anesthesia was provided with 1% lidocaine without epinephrine after prepped and draped in the usual sterile fashion. Puncture was performed at L2-L3 using a 5 inch 22-gauge spinal needle via right paramedian approach. Using a single pass through the dura, the needle was placed within the thecal sac, with return of clear CSF. 15 mL of Isovue M-200 was injected into the thecal sac, with normal opacification of the nerve roots and cauda equina consistent with free flow within the subarachnoid space. I personally performed the lumbar puncture and administered the intrathecal contrast. I also personally supervised acquisition of the myelogram images. TECHNIQUE: Contiguous axial images were obtained through the lumbar spine after the intrathecal infusion of contrast. Coronal and sagittal reconstructions were obtained of the axial image sets. COMPARISON:  MRI lumbar spine dated September 26, 2018. CT lumbar spine dated Dec 17, 2014. CT lumbar myelogram dated March 07, 2010. FINDINGS: LUMBAR MYELOGRAM FINDINGS: Unchanged 3 mm anterolisthesis at L4-L5 when prone, which increases  to 6 mm with standing. Unchanged trace anterolisthesis at L5-S1 when prone, which increases to 5 mm with standing. Prior left unilateral posterior and interbody fusion at L4-L5 with increased lucency about the L5 pedicle screw. Prominent ventral extradural defect at L4-L5 with moderate spinal canal stenosis. CT LUMBAR MYELOGRAM FINDINGS: Segmentation: 5 lumbar type vertebral bodies. Alignment: Unchanged 3 mm anterolisthesis at L4-L5 and trace anterolisthesis at L5-S1. Vertebrae: Prior left unilateral posterior and interbody fusion at L4-L5 with increased lucency about the L5 pedicle screw. Progressive endplate cystic changes surrounding the interbody graft with unchanged mild subsidence of the graft into the L5 superior endplate. No acute fracture or other focal pathologic process. Conus medullaris and cauda equina: Conus  extends to the L2 level. Conus and cauda equina appear normal. Paraspinal and other soft tissues: Aortoiliac atherosclerotic vascular disease. Disc levels: T12-L1:  Negative. L1-L2:  Negative. L2-L3:  Unchanged minimal disc bulging.  No stenosis. L3-L4: Unchanged minimal disc bulging and mild bilateral facet arthropathy with ligamentum flavum hypertrophy. Unchanged mild bilateral neuroforaminal stenosis. No spinal canal stenosis. L4-L5: Prior posterior decompression and fusion. Unchanged prominent posterior endplate spurring and moderate bilateral facet arthropathy resulting in moderate spinal canal stenosis and severe bilateral neuroforaminal stenosis. L5-S1: Unchanged broad-based posterior disc protrusion eccentric to the left with severe bilateral facet arthropathy. Unchanged mild left lateral recess stenosis, severe left neuroforaminal stenosis, and moderate right neuroforaminal stenosis. IMPRESSION: 1. Prior posterior decompression and left unilateral fusion at L4-L5 with likely pseudoarthrosis and loosening of the L5 pedicle screw, mildly progressed since prior CT from May 2016. Unchanged moderate spinal canal and severe bilateral neuroforaminal stenosis at this level. 2. Unchanged severe left and moderate right neuroforaminal stenosis at L5-S1. 3. Dynamic anterolisthesis at L4-L5 and L5-S1. Electronically Signed   By: Titus Dubin M.D.   On: 01/14/2019 15:01    Recent Labs: Lab Results  Component Value Date   WBC 7.1 01/07/2019   HGB 13.8 01/07/2019   PLT 197 01/07/2019   NA 140 01/07/2019   K 4.4 01/07/2019   CL 104 01/07/2019   CO2 25 01/07/2019   GLUCOSE 148 (H) 01/07/2019   BUN 12 01/07/2019   CREATININE 0.68 01/07/2019   BILITOT 0.4 01/07/2019   ALKPHOS 82 03/14/2017   AST 31 01/07/2019   ALT 23 01/07/2019   PROT 7.1 01/07/2019   ALBUMIN 3.5 (L) 03/14/2017   CALCIUM 9.6 01/07/2019   GFRAA 109 01/07/2019    Speciality Comments: PLQ eye exam: 06/19/2017 Normal. [facility]. Follow up in 6 months.  Procedures:  No procedures performed Allergies: Codeine, Atorvastatin, and Quinapril hcl   Assessment / Plan:     Visit Diagnoses: Mixed connective tissue disease ([place]) - ANA 1:160 NS, RNP positive, inflammatory arthritis -She has no synovitis on exam.  She has not had any recent flares.  She is clinically doing well on Plaquenil 200 mg 1 tablet by mouth twice daily.  She discontinued MTX 3 weeks ago due to being unable to tolerate the nausea and vomiting she experienced.  She was previously switched from tablets of methotrexate to injectable but continued to have severe nausea and intermittent vomiting.  She tried taking Phenergan but continued to be unable to tolerate methotrexate.  She does not want to restart methotrexate at this time.  She will continue on monotherapy Plaquenil 200 mg 1 tablet twice daily.  She has not had any recent rashes or photosensitivity.  She continues to have intermittent symptoms of Raynaud's in both feet.  She has intermittent nasal ulcerations but no oral ulcerations.  She has chronic sicca symptoms.  She was advised to notify us if she develops any new or worsening symptoms.  She will follow-up in the office in 5 months.  High risk medication use - Plaquenil 200 mg twice daily.  Last Plaquenil eye exam normal on 06/19/2017. D/c MTX due to nausea and vomiting.  Most recent CBC/CMP within normal limits on 01/07/2019 and will monitor every 3 months.  She received her flu vaccine in December and previously Prevnar 13 and Pneumovax 23.  She was given a PLQ eye exam form today and she was encouraged to schedule an appointment ASAP.   Skin thickening - Secondary to diabetes.   Sjogren's syndrome with keratoconjunctivitis sicca ([place]) - She has chronic sicca symptoms. Her symptoms have been stable.   Primary osteoarthritis of both hands - She has PIP synovial thickening consistent with osteoarthritis of both hands.  She has no tenderness or synovitis  on exam.  Complete fist formation bilaterally.  Joint protection and muscle strengthening were discussed.   Primary osteoarthritis of both knees -No warmth or effusion.  She has good range of motion with no discomfort.  She has bilateral knee crepitus.  DDD (degenerative disc disease), lumbar - s/p laminectomy -Chronic pain.  She is scheduled for a L5-S1 fusion on 03/02/2019 performed by Dr. Arnoldo Morale.  Fibromyalgia -She continues have generalized muscle aches and muscle tenderness due to fibromyalgia.  She states that her myalgias are worse in her lower extremities when compared to her acute extremities.  She states that she has chronic fatigue but it has been stable recently.  She has been sleeping well overall. We discussed the importance of regular exercise and good sleep hygiene.   Other medical conditions are listed as follows:   MGUS (monoclonal gammopathy of unknown significance) - She is followed by hematology.   History of depression   History of diabetes mellitus   History of chronic pain   History of Cushing disease   History of hypothyroidism   History of anxiety   History of chronic kidney disease  Palpitations   Orders: No orders of the defined types were placed in this encounter.  No orders of the defined types were placed in this encounter.   Face-to-face time spent with patient was 30 minutes. Greater than 50% of time was spent in counseling and coordination of care.  Follow-Up Instructions: Return in 5 months (on 06/24/2019) for Mixed connective tissue disease , Osteoarthritis.   Ofilia Neas, PA-C  Note - This record has been created using Dragon software.  Chart creation errors have been sought, but may not always  have been located. Such creation errors do not reflect on  the standard of medical care.

## 2019-01-14 ENCOUNTER — Ambulatory Visit
Admission: RE | Admit: 2019-01-14 | Discharge: 2019-01-14 | Disposition: A | Discharge status: Home / Self Care | Source: Ambulatory Visit | Attending: Neurosurgery | Admitting: Neurosurgery

## 2019-01-14 ENCOUNTER — Other Ambulatory Visit: Payer: Self-pay

## 2019-01-14 DIAGNOSIS — M545 Low back pain, unspecified: Secondary | ICD-10-CM

## 2019-01-14 DIAGNOSIS — G8929 Other chronic pain: Secondary | ICD-10-CM

## 2019-01-14 MED ORDER — DIAZEPAM 5 MG PO TABS
10.0000 mg | ORAL_TABLET | Freq: Once | ORAL | Status: AC
  Administered 2019-01-14: 10 mg via ORAL

## 2019-01-14 MED ORDER — ONDANSETRON HCL 4 MG/2ML IJ SOLN
4.0000 mg | Freq: Once | INTRAMUSCULAR | Status: AC
  Administered 2019-01-14: 4 mg via INTRAMUSCULAR

## 2019-01-14 MED ORDER — MEPERIDINE HCL 50 MG/ML IJ SOLN
50.0000 mg | Freq: Once | INTRAMUSCULAR | Status: AC
  Administered 2019-01-14: 50 mg via INTRAMUSCULAR

## 2019-01-14 MED ORDER — IOPAMIDOL (ISOVUE-M 200) INJECTION 41%
15.0000 mL | Freq: Once | INTRAMUSCULAR | Status: AC
  Administered 2019-01-14: 15 mL via INTRATHECAL

## 2019-01-14 NOTE — Progress Notes (Signed)
Pt reports she has been off of excedrin for 3 days, and phenergan, tofranil and wellbutrin for 2 days for her myelogram appointment today.

## 2019-01-14 NOTE — Discharge Instructions (Signed)
Myelogram Discharge Instructions  1. Go home and rest quietly for the next 24 hours.  It is important to lie flat for the next 24 hours.  Get up only to go to the restroom.  You may lie in the bed or on a couch on your back, your stomach, your left side or your right side.  You may have one pillow under your head.  You may have pillows between your knees while you are on your side or under your knees while you are on your back.  2. DO NOT drive today.  Recline the seat as far back as it will go, while still wearing your seat belt, on the way home.  3. You may get up to go to the bathroom as needed.  You may sit up for 10 minutes to eat.  You may resume your normal diet and medications unless otherwise indicated.  Drink lots of extra fluids today and tomorrow.  4. The incidence of headache, nausea, or vomiting is about 5% (one in 20 patients).  If you develop a headache, lie flat and drink plenty of fluids until the headache goes away.  Caffeinated beverages may be helpful.  If you develop severe nausea and vomiting or a headache that does not go away with flat bed rest, call [phone].  5. You may resume normal activities after your 24 hours of bed rest is over; however, do not exert yourself strongly or do any heavy lifting tomorrow. If when you get up you have a headache when standing, go back to bed and force fluids for another 24 hours.  6. Call your physician for a follow-up appointment.  The results of your myelogram will be sent directly to your physician by the following day.  7. If you have any questions or if complications develop after you arrive home, please call [phone].  Discharge instructions have been explained to the patient.  The patient, or the person responsible for the patient, fully understands these instructions.  YOU MAY RESTART YOUR EXCEDRIN, PHENERGAN, WELLBUTRIN AND TOFRANIL TOMORROW 01/13/2019 AT 1:20PM.

## 2019-01-20 ENCOUNTER — Other Ambulatory Visit: Payer: Self-pay | Admitting: Neurosurgery

## 2019-01-22 ENCOUNTER — Ambulatory Visit (INDEPENDENT_AMBULATORY_CARE_PROVIDER_SITE_OTHER): Admitting: Physician Assistant

## 2019-01-22 ENCOUNTER — Encounter: Payer: Self-pay | Admitting: Physician Assistant

## 2019-01-22 ENCOUNTER — Other Ambulatory Visit: Payer: Self-pay

## 2019-01-22 VITALS — BP 113/55 | HR 74 | Resp 12 | Ht 63.0 in | Wt 256.0 lb

## 2019-01-22 DIAGNOSIS — Z8659 Personal history of other mental and behavioral disorders: Secondary | ICD-10-CM

## 2019-01-22 DIAGNOSIS — M351 Other overlap syndromes: Secondary | ICD-10-CM | POA: Diagnosis not present

## 2019-01-22 DIAGNOSIS — Z79899 Other long term (current) drug therapy: Secondary | ICD-10-CM | POA: Diagnosis not present

## 2019-01-22 DIAGNOSIS — Z87448 Personal history of other diseases of urinary system: Secondary | ICD-10-CM

## 2019-01-22 DIAGNOSIS — M5136 Other intervertebral disc degeneration, lumbar region: Secondary | ICD-10-CM

## 2019-01-22 DIAGNOSIS — R002 Palpitations: Secondary | ICD-10-CM

## 2019-01-22 DIAGNOSIS — R234 Changes in skin texture: Secondary | ICD-10-CM

## 2019-01-22 DIAGNOSIS — Z8639 Personal history of other endocrine, nutritional and metabolic disease: Secondary | ICD-10-CM

## 2019-01-22 DIAGNOSIS — M797 Fibromyalgia: Secondary | ICD-10-CM

## 2019-01-22 DIAGNOSIS — M3501 Sicca syndrome with keratoconjunctivitis: Secondary | ICD-10-CM

## 2019-01-22 DIAGNOSIS — M170 Bilateral primary osteoarthritis of knee: Secondary | ICD-10-CM

## 2019-01-22 DIAGNOSIS — M19041 Primary osteoarthritis, right hand: Secondary | ICD-10-CM

## 2019-01-22 DIAGNOSIS — D472 Monoclonal gammopathy: Secondary | ICD-10-CM

## 2019-01-22 DIAGNOSIS — M19042 Primary osteoarthritis, left hand: Secondary | ICD-10-CM

## 2019-01-22 DIAGNOSIS — Z87898 Personal history of other specified conditions: Secondary | ICD-10-CM

## 2019-02-09 ENCOUNTER — Other Ambulatory Visit: Payer: Self-pay | Admitting: Neurosurgery

## 2019-02-18 ENCOUNTER — Ambulatory Visit: Payer: Self-pay | Admitting: Rheumatology

## 2019-02-24 ENCOUNTER — Other Ambulatory Visit (HOSPITAL_COMMUNITY)

## 2019-02-24 NOTE — Progress Notes (Signed)
[facility] - [place], [place] [address] Phone: [phone] Fax: [place], [place] [address] Phone: [phone] Fax: [phone]  [facility] [place], [place] [address] [place] [address] Phone: [phone] Fax: [phone]      Your procedure is scheduled on July 20th.  Report to [facility] Main Entrance "A" at 5:30 A.M., and check in at the Admitting office.  Call this number if you have problems the morning of surgery:  [phone]  Call [phone] if you have any questions prior to your surgery date Monday-Friday 8am-4pm    Remember:  Do not eat or drink after midnight the night before your surgery     Take these medicines the morning of surgery with A SIP OF WATER   Bupropion (Wellbutrin)  Dramamine - if needed  Tylenol - if needed  Hydroxychloroquine (Plaquenil)  Lorazepam (Ativan)  Thyroid (Armour)  7 days prior to surgery STOP taking any Aspirin (unless otherwise instructed by your surgeon), Aleve, Naproxen, Ibuprofen, Motrin, Advil, Goody's, BC's, all herbal medications, fish oil, and all vitamins.   WHAT DO I DO ABOUT MY DIABETES MEDICATION?   Marland Kitchen Do not take oral diabetes medicines (pills) the morning of surgery. - Jardiance  . THE DAY BEFORE SURGERY, DO NOT take Jardiance   . THE NIGHT BEFORE SURGERY, DO NOT evening dose of Humulin R       . THE MORNING OF SURGERY, DO NOT take Humulin R   . If your CBG is greater than 220 mg/dL, you may take  of your sliding scale (correction) dose of insulin.   How to Manage Your Diabetes Before and After Surgery  Why is it important to control my blood sugar before and after surgery? . Improving blood sugar levels before and after surgery  helps healing and can limit problems. . A way of improving blood sugar control is eating a healthy diet by: o  Eating less sugar and carbohydrates o  Increasing activity/exercise o  Talking with your doctor about reaching your blood sugar goals . High blood sugars (greater than 180 mg/dL) can raise your risk of infections and slow your recovery, so you will need to focus on controlling your diabetes during the weeks before surgery. . Make sure that the doctor who takes care of your diabetes knows about your planned surgery including the date and location.  How do I manage my blood sugar before surgery? . Check your blood sugar at least 4 times a day, starting 2 days before surgery, to make sure that the level is not too high or low. o Check your blood sugar the morning of your surgery when you wake up and every 2 hours until you get to the Short Stay unit. . If your blood sugar is less than 70 mg/dL, you will need to treat for low blood sugar: o Do not take insulin. o Treat a low blood sugar (less than 70 mg/dL) with  cup of clear juice (cranberry or apple), 4 glucose tablets, OR glucose gel. o Recheck blood sugar in 15 minutes after treatment (to make sure it is greater than 70 mg/dL). If your blood sugar is not greater than 70 mg/dL on recheck, call [phone]  for further instructions. . Report your blood sugar to the short stay nurse when you get to Short Stay.  . If you are admitted to the hospital after surgery: o Your blood sugar will be checked by the staff and you will probably be given insulin after surgery (instead of oral diabetes medicines) to make sure you have good blood sugar levels. o The goal for blood sugar control after surgery is 80-180 mg/dL.    The Morning of Surgery  Do not wear jewelry, make-up or nail polish.  Do not wear lotions, powders, or perfumes, or deodorant  Do not shave 48 hours prior to surgery.   Do not bring valuables to the hospital.  [facility]  is not responsible for any belongings or valuables.  If you are a smoker, DO NOT Smoke 24 hours prior to surgery IF you wear a CPAP at night please bring your mask, tubing, and machine the morning of surgery   Remember that you must have someone to transport you home after your surgery, and remain with you for 24 hours if you are discharged the same day.   Contacts, glasses, hearing aids, dentures or bridgework may not be worn into surgery.    Leave your suitcase in the car.  After surgery it may be brought to your room.  For patients admitted to the hospital, discharge time will be determined by your treatment team.  Patients discharged the day of surgery will not be allowed to drive home.    Special instructions:   [place]- Preparing For Surgery  Before surgery, you can play an important role. Because skin is not sterile, your skin needs to be as free of germs as possible. You can reduce the number of germs on your skin by washing with CHG (chlorahexidine gluconate) Soap before surgery.  CHG is an antiseptic cleaner which kills germs and bonds with the skin to continue killing germs even after washing.    Oral Hygiene is also important to reduce your risk of infection.  Remember - BRUSH YOUR TEETH THE MORNING OF SURGERY WITH YOUR REGULAR TOOTHPASTE  Please do not use if you have an allergy to CHG or antibacterial soaps. If your skin becomes reddened/irritated stop using the CHG.  Do not shave (including legs and underarms) for at least 48 hours prior to first CHG shower. It is OK to shave your face.  Please follow these instructions carefully.   1. Shower the NIGHT BEFORE SURGERY and the MORNING OF SURGERY with CHG Soap.   2. If you chose to wash your hair, wash your hair first as usual with your normal shampoo.  3. After you shampoo, rinse your hair and body thoroughly to remove the shampoo.  4. Use CHG as you would any other liquid soap. You can apply CHG directly to the  skin and wash gently with a scrungie or a clean washcloth.   5. Apply the CHG Soap to your body ONLY FROM THE NECK DOWN.  Do not use on open wounds or open sores. Avoid contact with your eyes, ears, mouth and genitals (private parts). Wash Face and genitals (private parts)  with your normal soap.   6. Wash thoroughly, paying special attention to the area where your surgery will be performed.  7. Thoroughly rinse your body with warm water from the neck down.  8. DO NOT shower/wash with your normal soap after using and rinsing off the CHG Soap.  9. Pat yourself dry with a CLEAN TOWEL.  10. Wear CLEAN PAJAMAS to bed the night before surgery, wear comfortable clothes the morning of surgery  11. Place CLEAN SHEETS on your bed the night of your first shower and DO NOT SLEEP WITH PETS.    Day of Surgery:  Do not apply any deodorants/lotions. Please shower the morning of surgery with the CHG soap  Please wear clean clothes to the hospital/surgery center.   Remember to brush your teeth WITH YOUR REGULAR TOOTHPASTE.   Please read over the following fact sheets that you were given.

## 2019-02-25 ENCOUNTER — Encounter (HOSPITAL_COMMUNITY): Payer: Self-pay

## 2019-02-25 ENCOUNTER — Other Ambulatory Visit: Payer: Self-pay

## 2019-02-25 ENCOUNTER — Encounter (HOSPITAL_COMMUNITY)
Admission: RE | Admit: 2019-02-25 | Discharge: 2019-02-25 | Disposition: A | Discharge status: Home / Self Care | Source: Ambulatory Visit | Attending: Neurosurgery | Admitting: Neurosurgery

## 2019-02-25 DIAGNOSIS — Z1159 Encounter for screening for other viral diseases: Secondary | ICD-10-CM | POA: Insufficient documentation

## 2019-02-25 DIAGNOSIS — Z01818 Encounter for other preprocedural examination: Secondary | ICD-10-CM | POA: Diagnosis not present

## 2019-02-25 DIAGNOSIS — I10 Essential (primary) hypertension: Secondary | ICD-10-CM | POA: Diagnosis not present

## 2019-02-25 DIAGNOSIS — M48062 Spinal stenosis, lumbar region with neurogenic claudication: Secondary | ICD-10-CM | POA: Diagnosis not present

## 2019-02-25 DIAGNOSIS — Z87891 Personal history of nicotine dependence: Secondary | ICD-10-CM | POA: Diagnosis not present

## 2019-02-25 HISTORY — DX: Pneumonia, unspecified organism: J18.9

## 2019-02-25 HISTORY — DX: Monoclonal gammopathy: D47.2

## 2019-02-25 HISTORY — DX: Essential (primary) hypertension: I10

## 2019-02-25 HISTORY — DX: Other overlap syndromes: M35.1

## 2019-02-25 HISTORY — DX: Dyspnea, unspecified: R06.00

## 2019-02-25 LAB — BASIC METABOLIC PANEL
Anion gap: 9 (ref 5–15)
BUN: 11 mg/dL (ref 8–23)
CO2: 25 mmol/L (ref 22–32)
Calcium: 8.7 mg/dL — ABNORMAL LOW (ref 8.9–10.3)
Chloride: 106 mmol/L (ref 98–111)
Creatinine, Ser: 0.6 mg/dL (ref 0.44–1.00)
GFR calc Af Amer: >60 mL/min (ref >60)
GFR calc non Af Amer: >60 mL/min (ref >60)
Glucose, Bld: 88 mg/dL (ref 70–99)
Potassium: 4 mmol/L (ref 3.5–5.1)
Sodium: 140 mmol/L (ref 135–145)

## 2019-02-25 LAB — GLUCOSE, CAPILLARY
Glucose-Capillary: 66 mg/dL — ABNORMAL LOW (ref 70–99)
Glucose-Capillary: 81 mg/dL (ref 70–99)

## 2019-02-25 LAB — CBC
HCT: 42.5 % (ref 36.0–46.0)
Hemoglobin: 13.1 g/dL (ref 12.0–15.0)
MCH: 29.1 pg (ref 26.0–34.0)
MCHC: 30.8 g/dL (ref 30.0–36.0)
MCV: 94.4 fL (ref 80.0–100.0)
Platelets: 179 10*3/uL (ref 150–400)
RBC: 4.5 MIL/uL (ref 3.87–5.11)
RDW: 15.3 % (ref 11.5–15.5)
WBC: 6.6 10*3/uL (ref 4.0–10.5)
nRBC: 0 % (ref 0.0–0.2)

## 2019-02-25 LAB — HEMOGLOBIN A1C
Hgb A1c MFr Bld: 7.1 % — ABNORMAL HIGH (ref 4.8–5.6)
Mean Plasma Glucose: 157.07 mg/dL

## 2019-02-25 LAB — TYPE AND SCREEN
ABO/RH(D): A POS
Antibody Screen: NEGATIVE

## 2019-02-25 LAB — SURGICAL PCR SCREEN
MRSA, PCR: NEGATIVE
Staphylococcus aureus: NEGATIVE

## 2019-02-25 NOTE — Progress Notes (Addendum)
PCP - Florestine Avers Cardiologist - Lindajo Royal Endocrinologist - Iran Planas  CBG at PAT appointment - 55 Gave pt Sprite to sip on.  Per Jeneen Rinks, recheck blood sugar before she leaves (recheck CBG - 81) Call Endocrinologist for follow up in regards to diabetic medications to take DOS. Pt taking a concentrated dose of Humulin R.  Need clarification from Dr. Tamala Julian.  Chest x-ray - n/a EKG - 02-25-19 ECHO - 10-22-2017  SA - yes, does not wear CPAP  DM - type 2 Fasting Blood Sugar - 90-120s  Anesthesia review: yes, EKG  Patient denies shortness of breath, fever, cough and chest pain at PAT appointment   Patient verbalized understanding of instructions that were given to them at the PAT appointment. Patient was also instructed that they will need to review over the PAT instructions again at home before surgery.

## 2019-02-26 ENCOUNTER — Encounter (HOSPITAL_COMMUNITY): Payer: Self-pay

## 2019-02-26 NOTE — Anesthesia Preprocedure Evaluation (Addendum)
Anesthesia Evaluation  Patient identified by MRN, date of birth, ID band Patient awake    Reviewed: Allergy & Precautions, H&P , NPO status , Patient's Chart, lab work & pertinent test results  History of Anesthesia Complications (+) DIFFICULT AIRWAY  Airway Mallampati: III  TM Distance: >3 FB Neck ROM: Limited    Dental  (+) Teeth Intact, Dental Advisory Given   Pulmonary neg pulmonary ROS, sleep apnea , former smoker,    breath sounds clear to auscultation       Cardiovascular Exercise Tolerance: Good hypertension, Pt. on medications  Rhythm:Regular Rate:Normal     Neuro/Psych  Headaches, Anxiety Depression    GI/Hepatic Neg liver ROS, GERD  ,  Endo/Other  diabetes, Insulin Dependent, Oral Hypoglycemic AgentsHypothyroidism Morbid obesity  Renal/GU negative Renal ROS  negative genitourinary   Musculoskeletal  (+) Arthritis , Fibromyalgia -  Abdominal   Peds negative pediatric ROS (+)  Hematology negative hematology ROS (+)   Anesthesia Other Findings   Reproductive/Obstetrics negative OB ROS                         Anesthesia Physical Anesthesia Plan  ASA: III  Anesthesia Plan: General   Post-op Pain Management:    Induction: Intravenous  PONV Risk Score and Plan: 4 or greater and Ondansetron, Midazolam, Scopolamine patch - Pre-op and Diphenhydramine  Airway Management Planned: Oral ETT and Video Laryngoscope Planned  Additional Equipment: Arterial line  Intra-op Plan:   Post-operative Plan: Extubation in OR  Informed Consent: I have reviewed the patients History and Physical, chart, labs and discussed the procedure including the risks, benefits and alternatives for the proposed anesthesia with the patient or authorized representative who has indicated his/her understanding and acceptance.     Dental advisory given  Plan Discussed with: CRNA  Anesthesia Plan Comments:  (PAT note written 02/26/2019 by Myra Gianotti, PA-C. )       Anesthesia Quick Evaluation

## 2019-02-26 NOTE — Progress Notes (Signed)
Anesthesia Chart Review:  Case: [id number] Date/Time: 03/02/19 0715   Procedure: POSTERIOR LUMBAR INTERBODY FUSION LUMBAR 4- LUMBAR 5, LUMBAR 5- SACRAL 1, POSTERIOR INSTRUMENTATION, EXPLORE FUSION (N/A ) - POSTERIOR LUMBAR INTERBODY FUSION LUMBAR 4- LUMBAR 5, LUMBAR 5- SACRAL 1, POSTERIOR INSTRUMENTATION, EXPLORE FUSION   Anesthesia type: General   Pre-op diagnosis: SPINAL STENOSIS, LUMBAR REGION WITH NEUROGENIC CLAUDICATION   Location: [place] OR ROOM 19 / [place] OR   Surgeon: Newman Pies, MD      DISCUSSION: Patient is  63 year old female scheduled for the above procedure.   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   Date/Procedure Anesthesia Records  11/13/11: L4-5 laminectomy Intubation Type: IV induction and Cricoid Pressure applied Grade View: Grade III Tube type: Oral Tube size: 7.5 mm Number of attempts: 1 Airway Equipment and Method: Video-laryngoscopy and Rigid stylet Placement Confirmation: ETT inserted through vocal cords under direct vision  Difficulty Due To: Difficulty was anticipated, Difficult Airway- due to reduced neck mobility, Difficult Airway- due to limited oral opening and Difficult Airway- due to anterior larynx  03/30/10: L4-5 anterolateral fusion  Difficult intubation due to obesity and decreased neck mobility.  A glidescope and #3 MAC were utilized. Future recommendations: Induction with short-acting agent and alternative techniques readily available (glidescope).   Patient is on Humulin R U-500 injections, so PAT RN advised that she confirm preoperative insulin instructions with her  endocrinologist Dr. Tamala Julian. I also called and spoke with Dr. Thompson Caul staff regarding following up with her. I will also put in a DM Coordinator consult given use of U-500. A1c is 7.1%.   Preoperative COVID-19 test is scheduled for 02/27/19. If negative and otherwise no acute changes then it is anticipated that she can proceed as planned.   VS: BP (!) 127/53   Pulse 72   Temp 36.5 C   Resp 20   Ht 5' 2.5" (1.588 m)   Wt 124.3 kg   LMP 11/08/2006   SpO2 96%   BMI 49.33 kg/m     PROVIDERS: Florestine Avers (Inactive) is PCP ([place]) - She is not followed routinely by cardiology, but was seen by Larae Grooms, MD in 2018 for palpitations. Event monitor and echo were unremarkable at that time. Last telephone encounter was with Dr. Chase Caller regarding finding of coronary calcifications on chest CT, but as of 11/05/17, Dr. Irish Lack did not recommend a stress test since she was not having any chest discomfort. Brand Males, MD is pulmonologist. Last visit 11/05/17. No evidence of pulmonary fibrosis or pulmonary hypertension on testing.  Arlice Colt, MD is neurologist. Last visit 09/11/18. Iran Planas, MD is endocrinologist ([place]). Last visit 10/28/18. He was aware of upcoming surgery.  Verner Chol, MD is HEM-ONC ([place]). Last visit 02/05/18. One year follow-up recommended.  Bo Merino, MD is rheumatologist. Last visit with Hazel Sams, PA-C on 01/22/19 who is aware of surgery plans. She was also seen by Gordan Payment, MD at [facility] for a second opinion on 12/30/17 ([place]).     LABS: Labs reviewed: Acceptable for surgery. (all labs ordered are listed, but only abnormal results are displayed)  Labs Reviewed  GLUCOSE, CAPILLARY - Abnormal; Notable for the following  components:      Result Value   Glucose-Capillary 66 (*)    All other components within normal limits  BASIC METABOLIC PANEL - Abnormal;  Notable for the following components:   Calcium 8.7 (*)    All other components within normal limits  HEMOGLOBIN A1C - Abnormal; Notable for the following components:   Hgb A1c MFr Bld 7.1 (*)    All other components within normal limits  SURGICAL PCR SCREEN  CBC  GLUCOSE, CAPILLARY  TYPE AND SCREEN    PFTs 10/24/17: FVC 2.46 (76%), post 2.47 (76%). FEV1 2.08 (84%), post 2.11 (85%). FEV1/FVC85% (108%), post 85%. DLCO unc 21.64 (91%), cor 21.99 (93%).   IMAGES: CT L-spine 01/14/19: IMPRESSION: 1. Prior posterior decompression and left unilateral fusion at L4-L5 with likely pseudoarthrosis and loosening of the L5 pedicle screw, mildly progressed since prior CT from May 2016. Unchanged moderate spinal canal and severe bilateral neuroforaminal stenosis at this level. 2. Unchanged severe left and moderate right neuroforaminal stenosis at L5-S1. 3. Dynamic anterolisthesis at L4-L5 and L5-S1.   EKG: 02/25/19: Normal sinus rhythm Left axis deviation Septal infarct , age undetermined No significant change since last tracing 11/08/11 Confirmed by Levin Erp [id number]) on 02/25/2019 8:08:53 PM   CV: Echo 10/22/17: Study Conclusions - Procedure narrative: Transthoracic echocardiography. Image   quality was suboptimal. The study was technically difficult.   Intravenous contrast (Definity) was administered. - Left ventricle: The cavity size was normal. Systolic function was   vigorous. The estimated ejection fraction was in the range of 65%   to 70%. Wall motion was normal; there were no regional wall   motion abnormalities. Doppler parameters are consistent with   abnormal left ventricular relaxation (grade 1 diastolic   dysfunction). Impressions: - Definity used; vigorous LV systolic function; mild diastolic   dysfunction.  Cardiac Event Monitor 05/15/17-06/18/17: Study Highlights:  Normal sinus rhythm.  No atrial fibrillation.  No significant pauses.  Nuclear stress test  02/06/08 ([facility], now [facility]; report scanned under Media tab, Correspondence, 03/30/10): Impression: Normal pattern of perfusion in all regions. Post-stress LVF is normal with EF 74$. No significant wall motion abnormalities. Exercise capacity 8 METS.    Past Medical History:  Diagnosis Date  . Anxiety   . Arthritis   . Cushing's disease ([place])    pituitary tumor removed 2012  . Diabetes mellitus    insulin pump.followed dr Peyton Bottoms [facility]  . Difficult intubation    2011 scratched trachea  . Dyspnea    upon exertion  . Fibromyalgia   . GERD (gastroesophageal reflux disease)   . Headache(784.0)   . Hypertension   . Hypothyroidism   . Lumbar disc disease   . Neuropathy   . Pneumonia   . Sjogren's disease ([place])   . Sleep apnea    no cpap.sleep study 2006 [place]    Past Surgical History:  Procedure Laterality Date  . APPENDECTOMY    . BACK SURGERY     lumbar fusion  . BRAIN SURGERY     For cushings  . CARPAL TUNNEL RELEASE Bilateral   . CESAREAN SECTION    . COLONOSCOPY    . COMBINED HYSTEROSCOPY DIAGNOSTIC / D&C    . ENDOMETRIAL ABLATION    . ESOPHAGOGASTRODUODENOSCOPY ENDOSCOPY    . FRACTURE SURGERY     l arm  . LUMBAR LAMINECTOMY/DECOMPRESSION MICRODISCECTOMY  11/13/2011   Procedure: LUMBAR LAMINECTOMY/DECOMPRESSION MICRODISCECTOMY;  Surgeon: Faythe Ghee, MD;  Location: [facility];  Service:  Neurosurgery;  Laterality: Bilateral;  Bilateral Lumbar four-five Decompression  . TONSILLECTOMY    . TRANSPHENOIDAL / TRANSNASAL HYPOPHYSECTOMY / RESECTION PITUITARY TUMOR    . TRIGGER FINGER RELEASE    . TUBAL LIGATION      MEDICATIONS: . ACCU-CHEK FASTCLIX LANCETS MISC  . APPLE CIDER VINEGAR PO  . aspirin-acetaminophen-caffeine (EXCEDRIN MIGRAINE) 250-250-65 MG tablet  . buPROPion (WELLBUTRIN XL) 150 MG 24 hr tablet  . Calcium Carb-Cholecalciferol (CALCIUM 1000 + D PO)  . calcium carbonate (TUMS - DOSED IN MG ELEMENTAL  CALCIUM) 500 MG chewable tablet  . cetirizine (ZYRTEC) 10 MG tablet  . diclofenac sodium (VOLTAREN) 1 % GEL  . dimenhyDRINATE (DRAMAMINE) 50 MG tablet  . diphenhydramine-acetaminophen (TYLENOL PM) 25-500 MG TABS tablet  . empagliflozin (JARDIANCE) 25 MG TABS tablet  . folic acid (FOLVITE) 1 MG tablet  . FREESTYLE LITE test strip  . furosemide (LASIX) 40 MG tablet  . HORIZANT 600 MG TBCR  . hydroxychloroquine (PLAQUENIL) 200 MG tablet  . Insulin Regular Human (HUMULIN R U-500, CONCENTRATED, [place])  . Insulin Syringe-Needle U-100 (BD INSULIN SYRINGE U/F) 31G X 5/16" 0.5 ML MISC  . lidocaine (XYLOCAINE) 5 % ointment  . LORazepam (ATIVAN) 1 MG tablet  . losartan (COZAAR) 50 MG tablet  . naproxen sodium (ALEVE) 220 MG tablet  . Polyethyl Glycol-Propyl Glycol (SYSTANE OP)  . PREVIDENT 5000 PLUS 1.1 % CREA dental cream  . Semaglutide (OZEMPIC) 0.25 or 0.5 MG/DOSE SOPN  . thyroid (ARMOUR) 240 MG tablet   No current facility-administered medications for this encounter.     Myra Gianotti, PA-C Surgical Short Stay/Anesthesiology [facility] Phone [phone] [facility] Phone [phone] 02/26/2019 11:45 AM

## 2019-02-27 ENCOUNTER — Other Ambulatory Visit (HOSPITAL_COMMUNITY)
Admission: RE | Admit: 2019-02-27 | Discharge: 2019-02-27 | Disposition: A | Discharge status: Home / Self Care | Source: Ambulatory Visit | Attending: Neurosurgery | Admitting: Neurosurgery

## 2019-02-27 DIAGNOSIS — Z01818 Encounter for other preprocedural examination: Secondary | ICD-10-CM | POA: Diagnosis not present

## 2019-02-27 LAB — SARS CORONAVIRUS 2 (TAT 6-24 HRS): SARS Coronavirus 2: NEGATIVE

## 2019-02-27 MED ORDER — DEXTROSE 5 % IV SOLN
3.0000 g | INTRAVENOUS | Status: AC
  Administered 2019-03-02 (×2): 3 g via INTRAVENOUS
  Filled 2019-02-27: qty 3

## 2019-03-02 ENCOUNTER — Inpatient Hospital Stay (HOSPITAL_COMMUNITY)

## 2019-03-02 ENCOUNTER — Encounter (HOSPITAL_COMMUNITY): Payer: Self-pay | Admitting: *Deleted

## 2019-03-02 ENCOUNTER — Other Ambulatory Visit: Payer: Self-pay

## 2019-03-02 ENCOUNTER — Inpatient Hospital Stay (HOSPITAL_COMMUNITY)
Admission: RE | Admit: 2019-03-02 | Discharge: 2019-03-06 | DRG: 454 | Disposition: A | Discharge status: Home Health | Attending: Neurosurgery | Admitting: Neurosurgery

## 2019-03-02 ENCOUNTER — Inpatient Hospital Stay (HOSPITAL_COMMUNITY)
Admission: RE | Disposition: A | Discharge status: Home Health | Payer: Self-pay | Source: Home / Self Care | Attending: Neurosurgery

## 2019-03-02 ENCOUNTER — Inpatient Hospital Stay (HOSPITAL_COMMUNITY): Admitting: Vascular Surgery

## 2019-03-02 ENCOUNTER — Inpatient Hospital Stay (HOSPITAL_COMMUNITY): Admitting: Physician Assistant

## 2019-03-02 DIAGNOSIS — Z87891 Personal history of nicotine dependence: Secondary | ICD-10-CM

## 2019-03-02 DIAGNOSIS — Z833 Family history of diabetes mellitus: Secondary | ICD-10-CM | POA: Diagnosis not present

## 2019-03-02 DIAGNOSIS — G8929 Other chronic pain: Secondary | ICD-10-CM | POA: Diagnosis present

## 2019-03-02 DIAGNOSIS — M797 Fibromyalgia: Secondary | ICD-10-CM | POA: Diagnosis present

## 2019-03-02 DIAGNOSIS — Z885 Allergy status to narcotic agent status: Secondary | ICD-10-CM

## 2019-03-02 DIAGNOSIS — D472 Monoclonal gammopathy: Secondary | ICD-10-CM | POA: Diagnosis present

## 2019-03-02 DIAGNOSIS — G4730 Sleep apnea, unspecified: Secondary | ICD-10-CM | POA: Diagnosis present

## 2019-03-02 DIAGNOSIS — M48062 Spinal stenosis, lumbar region with neurogenic claudication: Secondary | ICD-10-CM | POA: Diagnosis present

## 2019-03-02 DIAGNOSIS — Z800 Family history of malignant neoplasm of digestive organs: Secondary | ICD-10-CM

## 2019-03-02 DIAGNOSIS — M960 Pseudarthrosis after fusion or arthrodesis: Principal | ICD-10-CM | POA: Diagnosis present

## 2019-03-02 DIAGNOSIS — M5416 Radiculopathy, lumbar region: Secondary | ICD-10-CM | POA: Diagnosis present

## 2019-03-02 DIAGNOSIS — Z794 Long term (current) use of insulin: Secondary | ICD-10-CM | POA: Diagnosis not present

## 2019-03-02 DIAGNOSIS — Z8249 Family history of ischemic heart disease and other diseases of the circulatory system: Secondary | ICD-10-CM | POA: Diagnosis not present

## 2019-03-02 DIAGNOSIS — F419 Anxiety disorder, unspecified: Secondary | ICD-10-CM | POA: Diagnosis present

## 2019-03-02 DIAGNOSIS — E119 Type 2 diabetes mellitus without complications: Secondary | ICD-10-CM | POA: Diagnosis present

## 2019-03-02 DIAGNOSIS — M4317 Spondylolisthesis, lumbosacral region: Secondary | ICD-10-CM | POA: Diagnosis present

## 2019-03-02 DIAGNOSIS — K219 Gastro-esophageal reflux disease without esophagitis: Secondary | ICD-10-CM | POA: Diagnosis present

## 2019-03-02 DIAGNOSIS — M351 Other overlap syndromes: Secondary | ICD-10-CM | POA: Diagnosis present

## 2019-03-02 DIAGNOSIS — S32009K Unspecified fracture of unspecified lumbar vertebra, subsequent encounter for fracture with nonunion: Secondary | ICD-10-CM | POA: Diagnosis present

## 2019-03-02 DIAGNOSIS — Z888 Allergy status to other drugs, medicaments and biological substances status: Secondary | ICD-10-CM | POA: Diagnosis not present

## 2019-03-02 DIAGNOSIS — M3500 Sicca syndrome, unspecified: Secondary | ICD-10-CM | POA: Diagnosis present

## 2019-03-02 DIAGNOSIS — E039 Hypothyroidism, unspecified: Secondary | ICD-10-CM | POA: Diagnosis present

## 2019-03-02 DIAGNOSIS — Y838 Other surgical procedures as the cause of abnormal reaction of the patient, or of later complication, without mention of misadventure at the time of the procedure: Secondary | ICD-10-CM | POA: Diagnosis present

## 2019-03-02 DIAGNOSIS — I10 Essential (primary) hypertension: Secondary | ICD-10-CM | POA: Diagnosis present

## 2019-03-02 DIAGNOSIS — F329 Major depressive disorder, single episode, unspecified: Secondary | ICD-10-CM | POA: Diagnosis present

## 2019-03-02 DIAGNOSIS — Z419 Encounter for procedure for purposes other than remedying health state, unspecified: Secondary | ICD-10-CM

## 2019-03-02 LAB — GLUCOSE, CAPILLARY
Glucose-Capillary: 93 mg/dL (ref 70–99)
Glucose-Capillary: 121 mg/dL — ABNORMAL HIGH (ref 70–99)
Glucose-Capillary: 184 mg/dL — ABNORMAL HIGH (ref 70–99)
Glucose-Capillary: 217 mg/dL — ABNORMAL HIGH (ref 70–99)
Glucose-Capillary: 240 mg/dL — ABNORMAL HIGH (ref 70–99)
Glucose-Capillary: 287 mg/dL — ABNORMAL HIGH (ref 70–99)
Glucose-Capillary: 480 mg/dL — ABNORMAL HIGH (ref 70–99)

## 2019-03-02 SURGERY — POSTERIOR LUMBAR FUSION 2 LEVEL
Anesthesia: General | Site: Spine Lumbar

## 2019-03-02 MED ORDER — LIDOCAINE 2% (20 MG/ML) 5 ML SYRINGE
INTRAMUSCULAR | Status: DC | PRN
  Administered 2019-03-02: 40 mg via INTRAVENOUS
  Administered 2019-03-02: 60 mg via INTRAVENOUS

## 2019-03-02 MED ORDER — BISACODYL 10 MG RE SUPP: 10.0000 mg | Freq: Every day | RECTAL | Status: DC | PRN

## 2019-03-02 MED ORDER — LACTATED RINGERS IV SOLN: INTRAVENOUS | Status: DC

## 2019-03-02 MED ORDER — SODIUM CHLORIDE 0.9 % IV SOLN
INTRAVENOUS | Status: DC | PRN
  Administered 2019-03-02: 50 ug/min via INTRAVENOUS

## 2019-03-02 MED ORDER — BUPROPION HCL ER (XL) 150 MG PO TB24
150.0000 mg | ORAL_TABLET | Freq: Every day | ORAL | Status: DC
  Administered 2019-03-03 – 2019-03-06 (×4): 150 mg via ORAL
  Filled 2019-03-02 (×4): qty 1

## 2019-03-02 MED ORDER — PROPOFOL 10 MG/ML IV BOLUS
INTRAVENOUS | Status: DC | PRN
  Administered 2019-03-02: 110 mg via INTRAVENOUS

## 2019-03-02 MED ORDER — HYDROMORPHONE HCL 1 MG/ML IJ SOLN
0.2500 mg | INTRAMUSCULAR | Status: DC | PRN
  Administered 2019-03-02 (×4): 0.5 mg via INTRAVENOUS

## 2019-03-02 MED ORDER — LIDOCAINE 2% (20 MG/ML) 5 ML SYRINGE
INTRAMUSCULAR | Status: AC
  Filled 2019-03-02: qty 5

## 2019-03-02 MED ORDER — CHLORHEXIDINE GLUCONATE CLOTH 2 % EX PADS: 6.0000 | MEDICATED_PAD | Freq: Once | CUTANEOUS | Status: DC

## 2019-03-02 MED ORDER — SODIUM CHLORIDE 0.9 % IV SOLN
INTRAVENOUS | Status: DC | PRN
  Administered 2019-03-02: 12:00:00 via INTRAVENOUS

## 2019-03-02 MED ORDER — SODIUM CHLORIDE 0.9% FLUSH: 3.0000 mL | INTRAVENOUS | Status: DC | PRN

## 2019-03-02 MED ORDER — HYDROXYZINE HCL 50 MG/ML IM SOLN
50.0000 mg | Freq: Four times a day (QID) | INTRAMUSCULAR | Status: DC | PRN
  Administered 2019-03-02 – 2019-03-03 (×3): 50 mg via INTRAMUSCULAR
  Filled 2019-03-02 (×4): qty 1

## 2019-03-02 MED ORDER — ACETAMINOPHEN 325 MG PO TABS: 650.0000 mg | ORAL_TABLET | ORAL | Status: DC | PRN

## 2019-03-02 MED ORDER — FOLIC ACID 1 MG PO TABS
1.0000 mg | ORAL_TABLET | Freq: Every day | ORAL | Status: DC
  Administered 2019-03-03 – 2019-03-06 (×4): 1 mg via ORAL
  Filled 2019-03-02 (×4): qty 1

## 2019-03-02 MED ORDER — ONDANSETRON HCL 4 MG/2ML IJ SOLN
INTRAMUSCULAR | Status: AC
  Filled 2019-03-02: qty 2

## 2019-03-02 MED ORDER — ACETAMINOPHEN 650 MG RE SUPP: 650.0000 mg | RECTAL | Status: DC | PRN

## 2019-03-02 MED ORDER — BUPIVACAINE-EPINEPHRINE (PF) 0.5% -1:200000 IJ SOLN
INTRAMUSCULAR | Status: DC | PRN
  Administered 2019-03-02: 10 mL

## 2019-03-02 MED ORDER — LOSARTAN POTASSIUM 50 MG PO TABS
50.0000 mg | ORAL_TABLET | Freq: Every day | ORAL | Status: DC
  Administered 2019-03-04 – 2019-03-05 (×2): 50 mg via ORAL
  Filled 2019-03-02 (×4): qty 1

## 2019-03-02 MED ORDER — SUCCINYLCHOLINE CHLORIDE 200 MG/10ML IV SOSY
PREFILLED_SYRINGE | INTRAVENOUS | Status: DC | PRN
  Administered 2019-03-02: 160 mg via INTRAVENOUS

## 2019-03-02 MED ORDER — PHENYLEPHRINE 40 MCG/ML (10ML) SYRINGE FOR IV PUSH (FOR BLOOD PRESSURE SUPPORT)
PREFILLED_SYRINGE | INTRAVENOUS | Status: AC
  Filled 2019-03-02: qty 10

## 2019-03-02 MED ORDER — BACITRACIN ZINC 500 UNIT/GM EX OINT
TOPICAL_OINTMENT | CUTANEOUS | Status: AC
  Filled 2019-03-02: qty 28.35

## 2019-03-02 MED ORDER — FUROSEMIDE 40 MG PO TABS
40.0000 mg | ORAL_TABLET | Freq: Every day | ORAL | Status: DC
  Administered 2019-03-02 – 2019-03-06 (×5): 40 mg via ORAL
  Filled 2019-03-02 (×5): qty 1

## 2019-03-02 MED ORDER — SUCCINYLCHOLINE CHLORIDE 200 MG/10ML IV SOSY
PREFILLED_SYRINGE | INTRAVENOUS | Status: AC
  Filled 2019-03-02: qty 10

## 2019-03-02 MED ORDER — PROPOFOL 10 MG/ML IV BOLUS
INTRAVENOUS | Status: AC
  Filled 2019-03-02: qty 20

## 2019-03-02 MED ORDER — ROCURONIUM BROMIDE 10 MG/ML (PF) SYRINGE
PREFILLED_SYRINGE | INTRAVENOUS | Status: AC
  Filled 2019-03-02: qty 10

## 2019-03-02 MED ORDER — MORPHINE SULFATE (PF) 4 MG/ML IV SOLN
4.0000 mg | INTRAVENOUS | Status: DC | PRN
  Administered 2019-03-04 (×2): 4 mg via INTRAVENOUS
  Filled 2019-03-02 (×2): qty 1

## 2019-03-02 MED ORDER — SEMAGLUTIDE(0.25 OR 0.5MG/DOS) 2 MG/1.5ML ~~LOC~~ SOPN: 0.5000 mg | PEN_INJECTOR | SUBCUTANEOUS | Status: DC

## 2019-03-02 MED ORDER — DEXAMETHASONE SODIUM PHOSPHATE 10 MG/ML IJ SOLN
INTRAMUSCULAR | Status: AC
  Filled 2019-03-02: qty 1

## 2019-03-02 MED ORDER — ONDANSETRON HCL 4 MG/2ML IJ SOLN
4.0000 mg | Freq: Four times a day (QID) | INTRAMUSCULAR | Status: DC | PRN
  Administered 2019-03-02 (×2): 4 mg via INTRAVENOUS
  Filled 2019-03-02 (×2): qty 2

## 2019-03-02 MED ORDER — HYDROMORPHONE HCL 1 MG/ML IJ SOLN
INTRAMUSCULAR | Status: AC
  Administered 2019-03-02: 0.5 mg via INTRAVENOUS
  Filled 2019-03-02: qty 1

## 2019-03-02 MED ORDER — INSULIN ASPART 100 UNIT/ML ~~LOC~~ SOLN
15.0000 [IU] | Freq: Once | SUBCUTANEOUS | Status: AC
  Administered 2019-03-02: 15 [IU] via SUBCUTANEOUS

## 2019-03-02 MED ORDER — PROMETHAZINE HCL 25 MG/ML IJ SOLN
INTRAMUSCULAR | Status: AC
  Administered 2019-03-02: 6.25 mg via INTRAVENOUS
  Filled 2019-03-02: qty 1

## 2019-03-02 MED ORDER — ONDANSETRON HCL 4 MG/2ML IJ SOLN
INTRAMUSCULAR | Status: DC | PRN
  Administered 2019-03-02: 4 mg via INTRAVENOUS

## 2019-03-02 MED ORDER — EPHEDRINE 5 MG/ML INJ
INTRAVENOUS | Status: AC
  Filled 2019-03-02: qty 10

## 2019-03-02 MED ORDER — BUPIVACAINE LIPOSOME 1.3 % IJ SUSP
20.0000 mL | Freq: Once | INTRAMUSCULAR | Status: DC
  Filled 2019-03-02: qty 20

## 2019-03-02 MED ORDER — ACETAMINOPHEN 500 MG PO TABS
1000.0000 mg | ORAL_TABLET | Freq: Once | ORAL | Status: AC
  Administered 2019-03-02: 06:00:00 1000 mg via ORAL
  Filled 2019-03-02: qty 2

## 2019-03-02 MED ORDER — MIDAZOLAM HCL 5 MG/5ML IJ SOLN
INTRAMUSCULAR | Status: DC | PRN
  Administered 2019-03-02: 2 mg via INTRAVENOUS

## 2019-03-02 MED ORDER — CEFAZOLIN SODIUM-DEXTROSE 2-4 GM/100ML-% IV SOLN
2.0000 g | Freq: Three times a day (TID) | INTRAVENOUS | Status: AC
  Administered 2019-03-02 – 2019-03-03 (×2): 2 g via INTRAVENOUS
  Filled 2019-03-02 (×2): qty 100

## 2019-03-02 MED ORDER — THROMBIN 5000 UNITS EX SOLR
CUTANEOUS | Status: AC
  Filled 2019-03-02: qty 5000

## 2019-03-02 MED ORDER — INSULIN ASPART 100 UNIT/ML ~~LOC~~ SOLN
SUBCUTANEOUS | Status: AC
  Administered 2019-03-02: 13:00:00 7 [IU] via SUBCUTANEOUS
  Filled 2019-03-02: qty 1

## 2019-03-02 MED ORDER — FENTANYL CITRATE (PF) 250 MCG/5ML IJ SOLN
INTRAMUSCULAR | Status: AC
  Filled 2019-03-02: qty 5

## 2019-03-02 MED ORDER — CYCLOBENZAPRINE HCL 10 MG PO TABS
10.0000 mg | ORAL_TABLET | Freq: Three times a day (TID) | ORAL | Status: DC | PRN
  Administered 2019-03-03 – 2019-03-05 (×3): 10 mg via ORAL
  Filled 2019-03-02 (×3): qty 1

## 2019-03-02 MED ORDER — MENTHOL 3 MG MT LOZG: 1.0000 | LOZENGE | OROMUCOSAL | Status: DC | PRN

## 2019-03-02 MED ORDER — SODIUM CHLORIDE 0.9 % IV SOLN
INTRAVENOUS | Status: DC | PRN
  Administered 2019-03-02: 500 mL

## 2019-03-02 MED ORDER — PROMETHAZINE HCL 25 MG/ML IJ SOLN
6.2500 mg | Freq: Four times a day (QID) | INTRAMUSCULAR | Status: DC | PRN
  Administered 2019-03-02: 6.25 mg via INTRAVENOUS

## 2019-03-02 MED ORDER — SODIUM CHLORIDE 0.9 % IV SOLN
250.0000 mL | INTRAVENOUS | Status: DC
  Administered 2019-03-02: 250 mL via INTRAVENOUS

## 2019-03-02 MED ORDER — MIDAZOLAM HCL 2 MG/2ML IJ SOLN
INTRAMUSCULAR | Status: AC
  Filled 2019-03-02: qty 2

## 2019-03-02 MED ORDER — GLYCOPYRROLATE PF 0.2 MG/ML IJ SOSY
PREFILLED_SYRINGE | INTRAMUSCULAR | Status: DC | PRN
  Administered 2019-03-02: .1 mg via INTRAVENOUS

## 2019-03-02 MED ORDER — BACITRACIN ZINC 500 UNIT/GM EX OINT
TOPICAL_OINTMENT | CUTANEOUS | Status: DC | PRN
  Administered 2019-03-02: 1 via TOPICAL

## 2019-03-02 MED ORDER — BUPIVACAINE-EPINEPHRINE (PF) 0.5% -1:200000 IJ SOLN
INTRAMUSCULAR | Status: AC
  Filled 2019-03-02: qty 30

## 2019-03-02 MED ORDER — PHENOL 1.4 % MT LIQD: 1.0000 | OROMUCOSAL | Status: DC | PRN

## 2019-03-02 MED ORDER — 0.9 % SODIUM CHLORIDE (POUR BTL) OPTIME
TOPICAL | Status: DC | PRN
  Administered 2019-03-02: 1000 mL

## 2019-03-02 MED ORDER — THYROID 120 MG PO TABS
240.0000 mg | ORAL_TABLET | Freq: Every day | ORAL | Status: DC
  Administered 2019-03-03 – 2019-03-06 (×4): 240 mg via ORAL
  Filled 2019-03-02 (×5): qty 2

## 2019-03-02 MED ORDER — EPHEDRINE SULFATE-NACL 50-0.9 MG/10ML-% IV SOSY
PREFILLED_SYRINGE | INTRAVENOUS | Status: DC | PRN
  Administered 2019-03-02 (×2): 5 mg via INTRAVENOUS

## 2019-03-02 MED ORDER — DEXAMETHASONE SODIUM PHOSPHATE 10 MG/ML IJ SOLN
INTRAMUSCULAR | Status: DC | PRN
  Administered 2019-03-02: 6 mg via INTRAVENOUS

## 2019-03-02 MED ORDER — DOCUSATE SODIUM 100 MG PO CAPS
100.0000 mg | ORAL_CAPSULE | Freq: Two times a day (BID) | ORAL | Status: DC
  Administered 2019-03-02 – 2019-03-06 (×8): 100 mg via ORAL
  Filled 2019-03-02 (×8): qty 1

## 2019-03-02 MED ORDER — ONDANSETRON HCL 4 MG PO TABS
4.0000 mg | ORAL_TABLET | Freq: Four times a day (QID) | ORAL | Status: DC | PRN
  Filled 2019-03-02: qty 1

## 2019-03-02 MED ORDER — ZOLPIDEM TARTRATE 5 MG PO TABS: 5.0000 mg | ORAL_TABLET | Freq: Every evening | ORAL | Status: DC | PRN

## 2019-03-02 MED ORDER — CANAGLIFLOZIN 100 MG PO TABS
100.0000 mg | ORAL_TABLET | Freq: Every day | ORAL | Status: DC
  Administered 2019-03-03 – 2019-03-06 (×4): 100 mg via ORAL
  Filled 2019-03-02 (×4): qty 1

## 2019-03-02 MED ORDER — PHENYLEPHRINE 40 MCG/ML (10ML) SYRINGE FOR IV PUSH (FOR BLOOD PRESSURE SUPPORT)
PREFILLED_SYRINGE | INTRAVENOUS | Status: DC | PRN
  Administered 2019-03-02: 80 ug via INTRAVENOUS

## 2019-03-02 MED ORDER — THROMBIN 5000 UNITS EX SOLR
OROMUCOSAL | Status: DC | PRN
  Administered 2019-03-02 (×2): 5 mL via TOPICAL

## 2019-03-02 MED ORDER — GLYCOPYRROLATE PF 0.2 MG/ML IJ SOSY
PREFILLED_SYRINGE | INTRAMUSCULAR | Status: AC
  Filled 2019-03-02: qty 1

## 2019-03-02 MED ORDER — INSULIN ASPART 100 UNIT/ML ~~LOC~~ SOLN
0.0000 [IU] | Freq: Three times a day (TID) | SUBCUTANEOUS | Status: DC
  Administered 2019-03-02: 11 [IU] via SUBCUTANEOUS
  Administered 2019-03-03 (×3): 7 [IU] via SUBCUTANEOUS
  Administered 2019-03-04: 4 [IU] via SUBCUTANEOUS
  Administered 2019-03-04 (×2): 7 [IU] via SUBCUTANEOUS
  Administered 2019-03-05 (×3): 4 [IU] via SUBCUTANEOUS
  Administered 2019-03-06: 11 [IU] via SUBCUTANEOUS
  Administered 2019-03-06: 4 [IU] via SUBCUTANEOUS

## 2019-03-02 MED ORDER — GABAPENTIN ENACARBIL ER 600 MG PO TBCR: 600.0000 mg | EXTENDED_RELEASE_TABLET | Freq: Two times a day (BID) | ORAL | Status: DC

## 2019-03-02 MED ORDER — FENTANYL CITRATE (PF) 100 MCG/2ML IJ SOLN
INTRAMUSCULAR | Status: DC | PRN
  Administered 2019-03-02: 50 ug via INTRAVENOUS
  Administered 2019-03-02: 150 ug via INTRAVENOUS
  Administered 2019-03-02 (×3): 50 ug via INTRAVENOUS

## 2019-03-02 MED ORDER — ALBUMIN HUMAN 5 % IV SOLN
INTRAVENOUS | Status: DC | PRN
  Administered 2019-03-02 (×2): via INTRAVENOUS

## 2019-03-02 MED ORDER — DIMENHYDRINATE 50 MG PO TABS: 50.0000 mg | ORAL_TABLET | Freq: Every day | ORAL | Status: DC | PRN

## 2019-03-02 MED ORDER — LACTATED RINGERS IV SOLN
INTRAVENOUS | Status: DC | PRN
  Administered 2019-03-02: 07:00:00 via INTRAVENOUS

## 2019-03-02 MED ORDER — INSULIN ASPART 100 UNIT/ML ~~LOC~~ SOLN
0.0000 [IU] | Freq: Every day | SUBCUTANEOUS | Status: DC
  Administered 2019-03-02: 5 [IU] via SUBCUTANEOUS
  Administered 2019-03-05: 23:00:00 2 [IU] via SUBCUTANEOUS

## 2019-03-02 MED ORDER — HYDROXYCHLOROQUINE SULFATE 200 MG PO TABS
200.0000 mg | ORAL_TABLET | Freq: Two times a day (BID) | ORAL | Status: DC
  Administered 2019-03-02 – 2019-03-06 (×8): 200 mg via ORAL
  Filled 2019-03-02 (×9): qty 1

## 2019-03-02 MED ORDER — INSULIN ASPART 100 UNIT/ML ~~LOC~~ SOLN
0.0000 [IU] | SUBCUTANEOUS | Status: DC
  Administered 2019-03-02: 13:00:00 7 [IU] via SUBCUTANEOUS

## 2019-03-02 MED ORDER — BUPIVACAINE LIPOSOME 1.3 % IJ SUSP
INTRAMUSCULAR | Status: DC | PRN
  Administered 2019-03-02: 20 mL

## 2019-03-02 MED ORDER — HYDROMORPHONE HCL 2 MG PO TABS
4.0000 mg | ORAL_TABLET | ORAL | Status: DC | PRN
  Administered 2019-03-02 – 2019-03-03 (×6): 4 mg via ORAL
  Filled 2019-03-02 (×6): qty 2

## 2019-03-02 MED ORDER — ACETAMINOPHEN 500 MG PO TABS
1000.0000 mg | ORAL_TABLET | Freq: Four times a day (QID) | ORAL | Status: AC
  Administered 2019-03-02 – 2019-03-03 (×3): 1000 mg via ORAL
  Filled 2019-03-02 (×3): qty 2

## 2019-03-02 MED ORDER — ROCURONIUM BROMIDE 50 MG/5ML IV SOSY
PREFILLED_SYRINGE | INTRAVENOUS | Status: DC | PRN
  Administered 2019-03-02 (×4): 20 mg via INTRAVENOUS
  Administered 2019-03-02: 50 mg via INTRAVENOUS

## 2019-03-02 MED ORDER — SUGAMMADEX SODIUM 200 MG/2ML IV SOLN
INTRAVENOUS | Status: DC | PRN
  Administered 2019-03-02: 400 mg via INTRAVENOUS

## 2019-03-02 MED ORDER — LORATADINE 10 MG PO TABS
10.0000 mg | ORAL_TABLET | Freq: Every day | ORAL | Status: DC
  Administered 2019-03-03 – 2019-03-06 (×4): 10 mg via ORAL
  Filled 2019-03-02 (×4): qty 1

## 2019-03-02 MED ORDER — SODIUM CHLORIDE 0.9% FLUSH
3.0000 mL | Freq: Two times a day (BID) | INTRAVENOUS | Status: DC
  Administered 2019-03-02 – 2019-03-06 (×6): 3 mL via INTRAVENOUS

## 2019-03-02 SURGICAL SUPPLY — 78 items
APL SKNCLS STERI-STRIP NONHPOA (GAUZE/BANDAGES/DRESSINGS) ×1
BAG DECANTER FOR FLEXI CONT (MISCELLANEOUS) ×3 IMPLANT
BENZOIN TINCTURE PRP APPL 2/3 (GAUZE/BANDAGES/DRESSINGS) ×3 IMPLANT
BLADE CLIPPER SURG (BLADE) IMPLANT
BUR MATCHSTICK NEURO 3.0 LAGG (BURR) ×3 IMPLANT
BUR PRECISION FLUTE 6.0 (BURR) ×3 IMPLANT
CANISTER SUCT 3000ML PPV (MISCELLANEOUS) ×3 IMPLANT
CARTRIDGE OIL MAESTRO DRILL (MISCELLANEOUS) ×1 IMPLANT
CLOSURE WOUND 1/2 X4 (GAUZE/BANDAGES/DRESSINGS) ×1
CONNECTOR RELINE 45-65 5.5 (Connector) ×2 IMPLANT
CONT SPEC 4OZ CLIKSEAL STRL BL (MISCELLANEOUS) ×3 IMPLANT
COVER BACK TABLE 60X90IN (DRAPES) ×3 IMPLANT
COVER WAND RF STERILE (DRAPES) ×1 IMPLANT
DECANTER SPIKE VIAL GLASS SM (MISCELLANEOUS) ×3 IMPLANT
DIFFUSER DRILL AIR PNEUMATIC (MISCELLANEOUS) ×3 IMPLANT
DRAPE C-ARM 42X72 X-RAY (DRAPES) ×6 IMPLANT
DRAPE HALF SHEET 40X57 (DRAPES) ×3 IMPLANT
DRAPE LAPAROTOMY 100X72X124 (DRAPES) ×3 IMPLANT
DRAPE SURG 17X23 STRL (DRAPES) ×12 IMPLANT
DRSG OPSITE POSTOP 4X8 (GAUZE/BANDAGES/DRESSINGS) ×2 IMPLANT
ELECT BLADE 4.0 EZ CLEAN MEGAD (MISCELLANEOUS) ×3
ELECT REM PT RETURN 9FT ADLT (ELECTROSURGICAL) ×3
ELECTRODE BLDE 4.0 EZ CLN MEGD (MISCELLANEOUS) ×1 IMPLANT
ELECTRODE REM PT RTRN 9FT ADLT (ELECTROSURGICAL) ×1 IMPLANT
GAUZE 4X4 16PLY RFD (DISPOSABLE) ×3 IMPLANT
GAUZE SPONGE 4X4 12PLY STRL (GAUZE/BANDAGES/DRESSINGS) ×1 IMPLANT
GLOVE BIO SURGEON STRL SZ 6.5 (GLOVE) ×1 IMPLANT
GLOVE BIO SURGEON STRL SZ7 (GLOVE) ×2 IMPLANT
GLOVE BIO SURGEON STRL SZ8 (GLOVE) ×6 IMPLANT
GLOVE BIO SURGEON STRL SZ8.5 (GLOVE) ×6 IMPLANT
GLOVE BIO SURGEONS STRL SZ 6.5 (GLOVE) ×1
GLOVE BIOGEL PI IND STRL 6.5 (GLOVE) IMPLANT
GLOVE BIOGEL PI IND STRL 7.0 (GLOVE) IMPLANT
GLOVE BIOGEL PI IND STRL 7.5 (GLOVE) IMPLANT
GLOVE BIOGEL PI INDICATOR 6.5 (GLOVE) ×4
GLOVE BIOGEL PI INDICATOR 7.0 (GLOVE) ×10
GLOVE BIOGEL PI INDICATOR 7.5 (GLOVE) ×4
GLOVE EXAM NITRILE XL STR (GLOVE) IMPLANT
GLOVE SURG SS PI 6.0 STRL IVOR (GLOVE) ×2 IMPLANT
GOWN STRL REUS W/ TWL LRG LVL3 (GOWN DISPOSABLE) IMPLANT
GOWN STRL REUS W/ TWL XL LVL3 (GOWN DISPOSABLE) ×2 IMPLANT
GOWN STRL REUS W/TWL 2XL LVL3 (GOWN DISPOSABLE) IMPLANT
GOWN STRL REUS W/TWL LRG LVL3 (GOWN DISPOSABLE) ×6
GOWN STRL REUS W/TWL XL LVL3 (GOWN DISPOSABLE) ×12
HEMOSTAT POWDER KIT SURGIFOAM (HEMOSTASIS) ×5 IMPLANT
KIT BASIN OR (CUSTOM PROCEDURE TRAY) ×3 IMPLANT
KIT INFUSE X SMALL 1.4CC (Orthopedic Implant) ×2 IMPLANT
KIT TURNOVER KIT B (KITS) ×3 IMPLANT
MILL MEDIUM DISP (BLADE) ×3 IMPLANT
NDL HYPO 21X1.5 SAFETY (NEEDLE) IMPLANT
NEEDLE HYPO 21X1.5 SAFETY (NEEDLE) ×3 IMPLANT
NEEDLE HYPO 22GX1.5 SAFETY (NEEDLE) ×3 IMPLANT
NS IRRIG 1000ML POUR BTL (IV SOLUTION) ×3 IMPLANT
OIL CARTRIDGE MAESTRO DRILL (MISCELLANEOUS) ×3
PACK LAMINECTOMY NEURO (CUSTOM PROCEDURE TRAY) ×3 IMPLANT
PAD ARMBOARD 7.5X6 YLW CONV (MISCELLANEOUS) ×17 IMPLANT
PATTIES SURGICAL .5 X1 (DISPOSABLE) IMPLANT
PATTIES SURGICAL 1X1 (DISPOSABLE) ×2 IMPLANT
PUTTY DBM 10CC CALC GRAN (Putty) ×2 IMPLANT
ROD RELIN-O LORD 5.5X65MM (Rod) ×4 IMPLANT
SCREW LOCK RELINE 5.5 TULIP (Screw) ×12 IMPLANT
SCREW RELINE-O POLY 6.5X40 (Screw) ×2 IMPLANT
SCREW RELINE-O POLY 7.5X45 (Screw) ×4 IMPLANT
SCREW RELINE-O POLY 7.5X50 (Screw) ×9 IMPLANT
SCREW RLINE PLY 2S 50X7.5XPA (Screw) IMPLANT
SPACER ALTERA 10X31-15 (Spacer) ×2 IMPLANT
SPONGE LAP 4X18 RFD (DISPOSABLE) IMPLANT
SPONGE NEURO XRAY DETECT 1X3 (DISPOSABLE) ×2 IMPLANT
SPONGE SURGIFOAM ABS GEL 100 (HEMOSTASIS) IMPLANT
STRIP CLOSURE SKIN 1/2X4 (GAUZE/BANDAGES/DRESSINGS) ×2 IMPLANT
SUT VIC AB 1 CT1 18XBRD ANBCTR (SUTURE) ×2 IMPLANT
SUT VIC AB 1 CT1 8-18 (SUTURE) ×6
SUT VIC AB 2-0 CP2 18 (SUTURE) ×6 IMPLANT
SYR 20CC LL (SYRINGE) ×2 IMPLANT
TOWEL GREEN STERILE (TOWEL DISPOSABLE) ×3 IMPLANT
TOWEL GREEN STERILE FF (TOWEL DISPOSABLE) ×3 IMPLANT
TRAY FOLEY MTR SLVR 16FR STAT (SET/KITS/TRAYS/PACK) ×3 IMPLANT
WATER STERILE IRR 1000ML POUR (IV SOLUTION) ×3 IMPLANT

## 2019-03-02 NOTE — Transfer of Care (Signed)
Immediate Anesthesia Transfer of Care Note  Patient: Gloria Porter  Procedure(s) Performed: POSTERIOR LUMBAR INTERBODY FUSION LUMBAR FOUR- LUMBAR FIVE, LUMBAR FIVE- SACRAL ONE, POSTERIOR INSTRUMENTATION, EXPLORE FUSION (N/A Spine Lumbar)  Patient Location: PACU  Anesthesia Type:General  Level of Consciousness: awake and patient cooperative  Airway & Oxygen Therapy: Patient Spontanous Breathing and Patient connected to face mask oxygen  Post-op Assessment: Report given to RN and Post -op Vital signs reviewed and stable  Post vital signs: Reviewed and stable  Last Vitals:  Vitals Value Taken Time  BP 123/46 03/02/19 1224  Temp    Pulse 74 03/02/19 1224  Resp 18 03/02/19 1224  SpO2 99 % 03/02/19 1224  Vitals shown include unvalidated device data.  Last Pain:  Vitals:   03/02/19 0615  TempSrc: Oral  PainSc:          Complications: No apparent anesthesia complications

## 2019-03-02 NOTE — Op Note (Signed)
Brief history: The patient is a 63 year old white female whose had a previous L4-5 fusion by another physician years ago.  She has had chronic and worsening back pain.  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  I discussed the various treatment options with her including surgery.  She has weighed the risks, benefits and alternatives of surgery and decided to proceed with an exploration of her lumbar fusion with a redo lumbar decompression, instrumentation and fusion.  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 Etc. requiring a wide decompression of the nerve roots.);  L5-S1 transforaminal lumbar interbody fusion with local morselized autograft bone and Zimmer DBM; insertion of interbody prosthesis at L5-S1 (globus peek expandable interbody prosthesis); posterior segmental instrumentation from L4 to S1 with globus titanium pedicle screws and rods; posterior lateral arthrodesis at L5-S1 and redo L4-5 posterior lateral arthrodesis with local morselized autograft bone and Zimmer DBM; exploration of lumbar fusion/removal of lumbar hardware  Surgeon: Dr. Earle Gell  Asst.: Arnetha Massy nurse practitioner  Anesthesia: Gen. endotracheal  Estimated blood loss: 300 cc  Drains: None  Complications: None  Description of procedure: The patient was brought to the operating room by the anesthesia team.  General endotracheal anesthesia was induced. The patient was turned to the prone position on the Wilson frame. The patient's lumbosacral region was then prepared with Betadine scrub and Betadine solution. Sterile drapes were applied.  I then injected the area to be incised with Marcaine with epinephrine solution. I then used the scalpel to make a linear midline incision over the L4-5 and L5-S1 interspace. I then used electrocautery to perform a bilateral subperiosteal dissection exposing the spinous process and lamina of L4, L5 and the upper sacrum, and exposed the old hardware at L4-5 on the left. We then inserted the Verstrac retractor to provide exposure.  We explored the fusion by removing the caps from the old screws.  We inspected the screws.  The left L5 pedicle screw was quite loose.  The L4 pedicle screw seemed okay.  Was still motion at L4-5.  Clearly she had a pseudoarthrosis at this level.  I began the decompression by using the high speed drill to perform laminotomies at L5-S1 bilaterally. We then used the Kerrison punches to widen the laminotomy and removed the ligamentum flavum at L5-S1 bilaterally. We used the Kerrison punches to remove the medial facets at L5-S1 bilaterally. We performed wide foraminotomies about the bilateral L5 and S1 nerve roots completing the decompression.  We now turned our attention to the posterior lumbar interbody fusion. I used a scalpel to incise the intervertebral disc at L5-S1 bilaterally. I then performed a partial intervertebral discectomy at L5-S1 bilaterally using the pituitary forceps. We prepared the vertebral endplates at G3-O7 bilaterally for the fusion by removing the soft tissues with the curettes. We then used the trial spacers to pick the appropriate sized interbody prosthesis. We prefilled his prosthesis with a combination of local morselized autograft bone that we obtained during the decompression as well as Zimmer DBM. We inserted  the prefilled  prosthesis into the interspace at L5-S1 from the left, we then turned and expanded the prosthesis. There was a good snug fit of the prosthesis in the interspace. We then filled and the remainder of the intervertebral disc space with local morselized autograft bone and Zimmer DBM. This completed the posterior lumbar interbody arthrodesis.  We now turned attention to the instrumentation. Under fluoroscopic guidance we cannulated the bilateral L5 and S1 and the right L4 pedicles with the bone probe (we used the old hole at L4 on the left). We then removed the bone probe. We then tapped the pedicle with a 6.5 millimeter tap. We then removed the tap. We probed inside the tapped pedicle with a ball probe to rule out cortical breaches. We then inserted a 7.5 x 50 and 45 millimeter pedicle screw into the L4, L5 and S1 pedicles bilaterally under fluoroscopic guidance. We then palpated along the medial aspect of the pedicles to rule out cortical breaches. There were none. The nerve roots were not injured. We then connected the unilateral pedicle screws with a lordotic rod. We compressed the construct and secured the rod in place with the caps. We then tightened the caps appropriately.  We placed a cross connector between the rods.  This completed the instrumentation from L4-S1 bilaterally.  We now turned our attention to the posterior lateral arthrodesis at L4-5 and L5-S1 bilaterally. We used the high-speed drill to decorticate the remainder of the facets, pars, transverse process at L4-5 and L5-S1 bilaterally. We then applied a combination of local morselized autograft bone and Zimmer DBM over these decorticated posterior lateral structures. This completed the posterior lateral arthrodesis.  We then obtained hemostasis using bipolar electrocautery. We irrigated the wound out with bacitracin solution. We inspected the thecal sac and nerve roots and noted they were well decompressed. We then removed the retractor. We  injected Exparel . We reapproximated patient's thoracolumbar fascia with interrupted #1 Vicryl suture. We reapproximated patient's subcutaneous tissue with interrupted 2-0 Vicryl suture. The reapproximated patient's skin with Steri-Strips and benzoin. The wound was then coated with bacitracin ointment. A sterile dressing was applied. The drapes were removed. The patient was subsequently returned to the supine position where they were extubated by the anesthesia team. He was then transported to the post anesthesia care unit in stable condition. All sponge instrument and needle counts were reportedly correct at the end of this case.

## 2019-03-02 NOTE — Progress Notes (Signed)
Inpatient Diabetes Program Recommendations  AACE/ADA: New Consensus Statement on Inpatient Glycemic Control (2015)  Target Ranges:  Prepandial:   less than 140 mg/dL      Peak postprandial:   less than 180 mg/dL (1-2 hours)      Critically ill patients:  140 - 180 mg/dL   Lab Results  Component Value Date   GLUCAP 217 (H) 03/02/2019   HGBA1C 7.1 (H) 02/25/2019    Review of Glycemic Control Results for Gloria Porter, Gloria Porter (MRN [id number]) as of 03/02/2019 12:11  Ref. Range 03/02/2019 06:22 03/02/2019 08:43 03/02/2019 10:25 03/02/2019 11:40  Glucose-Capillary Latest Ref Range: 70 - 99 mg/dL 93 121 (H) 184 (H) 217 (H)   Diabetes history: Type 2 DM Outpatient Diabetes medications: U-500 10-15 units QAM, QPM, Jardiance 25 mg QD, Ozempic 0.5 mg Qweek Current orders for Inpatient glycemic control: none Decadron 6 mg x1  Inpatient Diabetes Program Recommendations:    Noted consult placed this AM. Hypoglycemic prior to pre op this AM of 57 mg/dL. Not appropriate for discussion at this time as patient in surgery. Anticipate glucose trends to increase in the setting of steroids.  Consider - Novolog 0-20 units Q4H  Will continue to follow.   Thanks, Bronson Curb, MSN, RNC-OB Diabetes Coordinator [phone] (8a-5p)

## 2019-03-02 NOTE — Progress Notes (Signed)
Subjective: The patient is somnolent.  She is easily arousable.  Her back is appropriately sore.  Objective: Vital signs in last 24 hours: Temp:  [97 F (36.1 C)-98.3 F (36.8 C)] 98.1 F (36.7 C) (07/20 1401) Pulse Rate:  [72-77] 73 (07/20 1401) Resp:  [11-20] 18 (07/20 1401) BP: (103-135)/(44-86) 120/45 (07/20 1401) SpO2:  [91 %-99 %] 95 % (07/20 1401) Weight:  [124.3 kg] 124.3 kg (07/20 0625) Estimated body mass index is 49.32 kg/m as calculated from the following:   Height as of this encounter: 5' 2.5" (1.588 m).   Weight as of this encounter: 124.3 kg.   Intake/Output from previous day: No intake/output data recorded. Intake/Output this shift: Total I/O In: 2380 [I.V.:1750; Blood:130; IV Piggyback:500] Out: 420 [Urine:120; Blood:300]  Physical exam the patient looks well.  Her lower extremity strength is normal.  Lab Results: No results for input(s): WBC, HGB, HCT, PLT in the last 72 hours. BMET No results for input(s): NA, K, CL, CO2, GLUCOSE, BUN, CREATININE, CALCIUM in the last 72 hours.  Studies/Results: Dg Lumbar Spine 2-3 Views  Result Date: 03/02/2019 CLINICAL DATA:  L4-S1 PLIF for spinal stenosis. FLUOROSCOPY TIME:  31 seconds. Images: 2 EXAM: DG C-ARM 61-120 MIN COMPARISON:  None. FINDINGS: Pedicle screws have been placed at L4, L5, and S1. Disc spacer devices are seen at both levels as well in good position. Posterior retractor is identified. IMPRESSION: Surgical changes as above. Electronically Signed   By: Dorise Bullion III M.D   On: 03/02/2019 11:48   Dg C-arm 1-60 Min  Result Date: 03/02/2019 CLINICAL DATA:  L4-S1 PLIF for spinal stenosis. FLUOROSCOPY TIME:  31 seconds. Images: 2 EXAM: DG C-ARM 61-120 MIN COMPARISON:  None. FINDINGS: Pedicle screws have been placed at L4, L5, and S1. Disc spacer devices are seen at both levels as well in good position. Posterior retractor is identified. IMPRESSION: Surgical changes as above. Electronically Signed   By:  Dorise Bullion III M.D   On: 03/02/2019 11:47    Assessment/Plan: The patient is doing well.  I spoke with her husband.  LOS: 0 days     Ophelia Charter 03/02/2019, 2:05 PM

## 2019-03-02 NOTE — Anesthesia Procedure Notes (Signed)
Procedure Name: Intubation Date/Time: 03/02/2019 7:43 AM Performed by: Orlie Dakin, CRNA Pre-anesthesia Checklist: Patient identified, Suction available, Patient being monitored and Emergency Drugs available Patient Re-evaluated:Patient Re-evaluated prior to induction Oxygen Delivery Method: Circle system utilized Preoxygenation: Pre-oxygenation with 100% oxygen Induction Type: IV induction and Rapid sequence Laryngoscope Size: Glidescope and 4 Grade View: Grade I Tube type: Oral Tube size: 7.0 mm Number of attempts: 1 Airway Equipment and Method: Stylet and Video-laryngoscopy Placement Confirmation: ETT inserted through vocal cords under direct vision,  positive ETCO2 and breath sounds checked- equal and bilateral Secured at: 22 cm Tube secured with: Tape Dental Injury: Teeth and Oropharynx as per pre-operative assessment  Difficulty Due To: Difficulty was anticipated, Difficult Airway- due to anterior larynx, Difficult Airway- due to limited oral opening and Difficult Airway- due to reduced neck mobility Future Recommendations: Recommend- induction with short-acting agent, and alternative techniques readily available Comments: RSI, Glidescope used due to H/O difficult intubation, small mouth opening, short thick neck and large posterior neck "hump".  Slight rough edge to upper right front tooth noted with DL.  4x4s bite block used.

## 2019-03-02 NOTE — Progress Notes (Signed)
Pt. Reported this am blood sugar was 57, no signs/symptons of low sugar. Pt. Drank  1/2 cup apple juice on the way over.

## 2019-03-02 NOTE — Anesthesia Postprocedure Evaluation (Signed)
Anesthesia Post Note  Patient: RUKIA MCGILLIVRAY  Procedure(s) Performed: POSTERIOR LUMBAR INTERBODY FUSION LUMBAR FOUR- LUMBAR FIVE, LUMBAR FIVE- SACRAL ONE, POSTERIOR INSTRUMENTATION, EXPLORE FUSION (N/A Spine Lumbar)     Patient location during evaluation: PACU Anesthesia Type: General Level of consciousness: awake and alert Pain management: pain level controlled Vital Signs Assessment: post-procedure vital signs reviewed and stable Respiratory status: spontaneous breathing, nonlabored ventilation, respiratory function stable and patient connected to nasal cannula oxygen Cardiovascular status: blood pressure returned to baseline and stable Postop Assessment: no apparent nausea or vomiting Anesthetic complications: no    Last Vitals:  Vitals:   03/02/19 1330 03/02/19 1401  BP: 117/61 (!) 120/45  Pulse: 73 73  Resp: 11 18  Temp: (!) 36.3 C 36.7 C  SpO2: 92% 95%    Last Pain:  Vitals:   03/02/19 1401  TempSrc: Oral  PainSc:                  Bernette Seeman,W. EDMOND

## 2019-03-02 NOTE — H&P (Signed)
Subjective: The patient is a 63 year old white female whose had a previous lumbar fusion by another physician.  She has had persistent and worsening back pain.  She has failed medical management.  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  I discussed the various treatment options.  She has decided to proceed with surgery.  Past Medical History:  Diagnosis Date  . Anxiety   . Arthritis   . 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 Diabetes mellitus    Endocrinologist Dr. Iran Planas  . Difficult intubation    2011 scratched trachea  . Dyspnea    upon exertion  . Fibromyalgia   . GERD (gastroesophageal reflux disease)   . Headache(784.0)   . Hypertension   . Hypothyroidism   . Lumbar disc disease   . MGUS (monoclonal gammopathy of unknown significance)   . Mixed connective tissue disease ([place])   . Neuropathy   . Pneumonia   . Sjogren's disease ([place])   . Sleep apnea    no cpap.sleep study 2006 [place]    Past Surgical History:  Procedure Laterality Date  . APPENDECTOMY    . BACK SURGERY     lumbar fusion  . BRAIN SURGERY     For cushings  . CARPAL TUNNEL RELEASE Bilateral   . CESAREAN SECTION    . COLONOSCOPY    . COMBINED HYSTEROSCOPY DIAGNOSTIC / D&C    . ENDOMETRIAL ABLATION    . ESOPHAGOGASTRODUODENOSCOPY ENDOSCOPY    . FRACTURE SURGERY     l arm  . LUMBAR LAMINECTOMY/DECOMPRESSION MICRODISCECTOMY  11/13/2011   Procedure: LUMBAR LAMINECTOMY/DECOMPRESSION MICRODISCECTOMY;  Surgeon: Faythe Ghee, MD;  Location: [facility];  Service: Neurosurgery;  Laterality: Bilateral;  Bilateral Lumbar four-five Decompression  . TONSILLECTOMY    . TRANSPHENOIDAL / TRANSNASAL HYPOPHYSECTOMY / RESECTION PITUITARY TUMOR    . TRIGGER FINGER RELEASE    . TUBAL LIGATION      Allergies  Allergen Reactions  . Codeine Nausea And Vomiting   . Atorvastatin     She cannot remember reaction  . Methotrexate Derivatives Nausea And Vomiting    Over sedation  . Quinapril Hcl Cough    Social History   Tobacco Use  . Smoking status: Former Smoker    Packs/day: 1.00    Years: 32.00    Pack years: 32.00    Types: Cigarettes    Quit date: 07/27/2006    Years since quitting: 12.6  . Smokeless tobacco: Never Used  Substance Use Topics  . Alcohol use: Yes    Comment: rarely    Family History  Problem Relation Age of Onset  . Diabetes type II Mother   . Diabetes type II Father   . Heart disease Father   . Heart attack Father   . Colon cancer Maternal Grandmother   . Heart disease Maternal Grandfather    Prior to Admission medications   Medication Sig Start Date End Date Taking? Authorizing Provider  APPLE CIDER VINEGAR PO Take 400 mg by mouth 3 (three) times daily.    Yes [provider]  aspirin-acetaminophen-caffeine (EXCEDRIN MIGRAINE) [phone] MG tablet Take 3 tablets by mouth daily as needed for headache.   Yes [provider]  buPROPion (WELLBUTRIN XL) 150 MG 24 hr tablet Take 150 mg by mouth daily.   Yes [provider]  Calcium Carb-Cholecalciferol (CALCIUM  1000 + D PO) Take 1 tablet by mouth daily.    Yes [provider]  calcium carbonate (TUMS - DOSED IN MG ELEMENTAL CALCIUM) 500 MG chewable tablet Chew 3 tablets by mouth daily as needed for indigestion or heartburn.   Yes [provider]  cetirizine (ZYRTEC) 10 MG tablet Take 10 mg by mouth.   Yes [provider]  diclofenac sodium (VOLTAREN) 1 % GEL Apply 1 application topically daily as needed (knee / hand pain).  06/06/17  Yes [provider]  dimenhyDRINATE (DRAMAMINE) 50 MG tablet Take 50 mg by mouth daily as needed for dizziness.   Yes [provider]  diphenhydramine-acetaminophen (TYLENOL PM) 25-500 MG TABS tablet Take 3 tablets by mouth at bedtime as needed (pain/sleep).   Yes  [provider]  empagliflozin (JARDIANCE) 25 MG TABS tablet Take 25 mg by mouth daily.  02/19/18  Yes [provider]  folic acid (FOLVITE) 1 MG tablet TAKE 2 TABLETS(2 MG) BY MOUTH DAILY Patient taking differently: Take 1 mg by mouth daily.  09/26/18  Yes Deveshwar, Abel Presto, MD  furosemide (LASIX) 40 MG tablet Take 40 mg by mouth daily.   Yes [provider]  HORIZANT 600 MG TBCR Take 1 tablet (600 mg total) by mouth 2 (two) times daily. Patient taking differently: Take 1,200 mg by mouth daily with supper.  09/11/18  Yes Sater, Nanine Means, MD  hydroxychloroquine (PLAQUENIL) 200 MG tablet Take 1 tablet (200 mg total) by mouth 2 (two) times daily. 10/31/18  Yes Deveshwar, Abel Presto, MD  Insulin Regular Human (HUMULIN R U-500, CONCENTRATED, [place]) Inject 0.05-0.25 mLs into the skin 3 (three) times daily as needed (for high blood sugar).    Yes [provider]  lidocaine (XYLOCAINE) 5 % ointment Apply 1 application topically as needed. Patient taking differently: Apply 1 application topically 2 (two) times daily as needed (toe pain).  09/11/18  Yes Sater, Nanine Means, MD  LORazepam (ATIVAN) 1 MG tablet Take 3 mg by mouth at bedtime.  08/25/17  Yes [provider]  losartan (COZAAR) 50 MG tablet Take 50 mg by mouth daily after supper.  12/23/14 02/17/19 Yes [provider]  naproxen sodium (ALEVE) 220 MG tablet Take 660 mg by mouth daily as needed (pain).   Yes [provider]  Polyethyl Glycol-Propyl Glycol (SYSTANE OP) Place 1 drop into both eyes at bedtime as needed (dry eyes).   Yes [provider]  PREVIDENT 5000 PLUS 1.1 % CREA dental cream Place 1 application onto teeth 3 (three) times a week.  05/09/18  Yes [provider]  Semaglutide (OZEMPIC) 0.25 or 0.5 MG/DOSE SOPN Inject 0.5 mg into the skin every Thursday.    Yes [provider]  thyroid (ARMOUR) 240 MG tablet Take 240 mg by mouth daily.    Yes [provider]   ACCU-CHEK FASTCLIX LANCETS [place]  03/23/14   [provider]  FREESTYLE LITE test strip daily. 08/22/17   [provider]  Insulin Syringe-Needle U-100 (BD INSULIN SYRINGE U/F) 31G X 5/16" 0.5 ML MISC USE TO INJECT UP TO THREE TIMES A DAY 07/28/18   [provider]     Review of Systems  Positive ROS: As above  All other systems have been reviewed and were otherwise negative with the exception of those mentioned in the HPI and as above.  Objective: Vital signs in last 24 hours: Temp:  [98.3 F (36.8 C)] 98.3 F (36.8 C) (07/20 0615) Pulse Rate:  [  75] 75 (07/20 0625) Resp:  [20] 20 (07/20 0625) BP: (135)/(73) 135/73 (07/20 0625) SpO2:  [95 %] 95 % (07/20 0625) Weight:  [124.3 kg] 124.3 kg (07/20 0625) Estimated body mass index is 49.32 kg/m as calculated from the following:   Height as of this encounter: 5' 2.5" (1.588 m).   Weight as of this encounter: 124.3 kg.   General Appearance: Alert Head: Normocephalic, without obvious abnormality, atraumatic Eyes: PERRL, conjunctiva/corneas clear, EOM's intact,    Ears: Normal  Throat: Normal  Neck: Supple, Back: The patient's lumbar incision is well-healed. Lungs: Clear to auscultation bilaterally, respirations unlabored Heart: Regular rate and rhythm, no murmur, rub or gallop Abdomen: Soft, non-tender Extremities: Extremities normal, atraumatic, no cyanosis or edema Skin: unremarkable  NEUROLOGIC:   Mental status: alert and oriented,Motor Exam - grossly normal Sensory Exam - grossly normal Reflexes:  Coordination - grossly normal Gait - grossly normal Balance - grossly normal Cranial Nerves: I: smell Not tested  II: visual acuity  OS: Normal  OD: Normal   II: visual fields Full to confrontation  II: pupils Equal, round, reactive to light  III,VII: ptosis None  III,IV,VI: extraocular muscles  Full ROM  V: mastication Normal  V: facial light touch sensation  Normal  V,VII: corneal reflex   Present  VII: facial muscle function - upper  Normal  VII: facial muscle function - lower Normal  VIII: hearing Not tested  IX: soft palate elevation  Normal  IX,X: gag reflex Present  XI: trapezius strength  5/5  XI: sternocleidomastoid strength 5/5  XI: neck flexion strength  5/5  XII: tongue strength  Normal    Data Review Lab Results  Component Value Date   WBC 6.6 02/25/2019   HGB 13.1 02/25/2019   HCT 42.5 02/25/2019   MCV 94.4 02/25/2019   PLT 179 02/25/2019   Lab Results  Component Value Date   NA 140 02/25/2019   K 4.0 02/25/2019   CL 106 02/25/2019   CO2 25 02/25/2019   BUN 11 02/25/2019   CREATININE 0.60 02/25/2019   GLUCOSE 88 02/25/2019   No results found for: INR, PROTIME  Assessment/Plan: Lumbar pseudoarthrosis, lumbar spinal stenosis, lumbago, lumbar radiculopathy: I have discussed the situation with the patient.  I reviewed her myelo CT with her and pointed out the abnormalities.  We have discussed the various treatment options including surgery.  I have described the surgical treatment option of an exploration of her lumbar fusion with an L4-5 and L5-S1 decompression, instrumentation and fusion.  I have shown her surgical models.  We have discussed the risks, benefits, alternatives, expected postoperative course, and likelihood of achieving our goals with surgery.  I have answered all the patient's questions.  She has decided to proceed with surgery.   Ophelia Charter 03/02/2019 7:30 AM

## 2019-03-03 LAB — GLUCOSE, CAPILLARY
Glucose-Capillary: 198 mg/dL — ABNORMAL HIGH (ref 70–99)
Glucose-Capillary: 217 mg/dL — ABNORMAL HIGH (ref 70–99)
Glucose-Capillary: 219 mg/dL — ABNORMAL HIGH (ref 70–99)
Glucose-Capillary: 227 mg/dL — ABNORMAL HIGH (ref 70–99)
Glucose-Capillary: 269 mg/dL — ABNORMAL HIGH (ref 70–99)

## 2019-03-03 LAB — CBC
HCT: 36.9 % (ref 36.0–46.0)
Hemoglobin: 11.4 g/dL — ABNORMAL LOW (ref 12.0–15.0)
MCH: 29 pg (ref 26.0–34.0)
MCHC: 30.9 g/dL (ref 30.0–36.0)
MCV: 93.9 fL (ref 80.0–100.0)
Platelets: 130 10*3/uL — ABNORMAL LOW (ref 150–400)
RBC: 3.93 MIL/uL (ref 3.87–5.11)
RDW: 15.3 % (ref 11.5–15.5)
WBC: 11.2 10*3/uL — ABNORMAL HIGH (ref 4.0–10.5)
nRBC: 0 % (ref 0.0–0.2)

## 2019-03-03 LAB — BASIC METABOLIC PANEL
Anion gap: 9 (ref 5–15)
BUN: 24 mg/dL — ABNORMAL HIGH (ref 8–23)
CO2: 25 mmol/L (ref 22–32)
Calcium: 8.6 mg/dL — ABNORMAL LOW (ref 8.9–10.3)
Chloride: 102 mmol/L (ref 98–111)
Creatinine, Ser: 1.23 mg/dL — ABNORMAL HIGH (ref 0.44–1.00)
GFR calc Af Amer: 54 mL/min — ABNORMAL LOW (ref >60)
GFR calc non Af Amer: 47 mL/min — ABNORMAL LOW (ref >60)
Glucose, Bld: 231 mg/dL — ABNORMAL HIGH (ref 70–99)
Potassium: 4.5 mmol/L (ref 3.5–5.1)
Sodium: 136 mmol/L (ref 135–145)

## 2019-03-03 MED ORDER — PROMETHAZINE HCL 25 MG PO TABS
25.0000 mg | ORAL_TABLET | Freq: Four times a day (QID) | ORAL | Status: DC | PRN
  Administered 2019-03-03 – 2019-03-06 (×5): 25 mg via ORAL
  Filled 2019-03-03 (×5): qty 1

## 2019-03-03 MED ORDER — PROMETHAZINE HCL 25 MG RE SUPP
25.0000 mg | Freq: Once | RECTAL | Status: AC
  Administered 2019-03-03: 25 mg via RECTAL
  Filled 2019-03-03: qty 1

## 2019-03-03 MED ORDER — INSULIN ASPART 100 UNIT/ML ~~LOC~~ SOLN
5.0000 [IU] | Freq: Two times a day (BID) | SUBCUTANEOUS | Status: DC
  Administered 2019-03-03 – 2019-03-06 (×6): 5 [IU] via SUBCUTANEOUS

## 2019-03-03 MED ORDER — PROMETHAZINE HCL 25 MG RE SUPP: 25.0000 mg | Freq: Four times a day (QID) | RECTAL | Status: DC | PRN

## 2019-03-03 MED ORDER — HYDROCODONE-ACETAMINOPHEN 10-325 MG PO TABS
1.0000 | ORAL_TABLET | ORAL | Status: DC | PRN
  Administered 2019-03-04 – 2019-03-06 (×11): 1 via ORAL
  Filled 2019-03-03 (×11): qty 1

## 2019-03-03 MED FILL — Gelatin Absorbable MT Powder: OROMUCOSAL | Qty: 1 | Status: AC

## 2019-03-03 MED FILL — Thrombin For Soln 5000 Unit: CUTANEOUS | Qty: 5000 | Status: AC

## 2019-03-03 NOTE — Evaluation (Signed)
Physical Therapy Evaluation Patient Details Name: Gloria Porter MRN: [id number] DOB: [birth year] Today's Date: 03/03/2019   History of Present Illness  Pt is a 63 y/o female who presents s/p L4-S1 on 03/02/2019. PMH significant for Sjogren's disease, neuropathy, hypothyroidism, HTN, fibromyalgia, DM, Cushing's disease s/p pituitary tumor removal in 2012, prior lumbar fusion.   Clinical Impression  Pt admitted with above diagnosis. Pt currently with functional limitations due to the deficits listed below (see PT Problem List). At the time of PT eval pt was able to perform transfers and ambulation with gross min guard assist for balance support and safety with the RW. Pt received lumbar corset PTA and is now not fitting appropriately. Would benefit from an extender or a larger brace size. Pt was educated on precautions, brace application, and activity progression. Acutely, pt will benefit from skilled PT to increase their independence and safety with mobility to allow discharge to the venue listed below.       Follow Up Recommendations Home health PT;Supervision for mobility/OOB    Equipment Recommendations  Other (comment)(Brace extension vs New/larger brace)    Recommendations for Other Services       Precautions / Restrictions Precautions Precautions: Fall;Cervical Precaution Booklet Issued: Yes (comment) Precaution Comments: Reviewed handout and pt was cued for precautions during functional mobility.  Required Braces or Orthoses: Spinal Brace Spinal Brace: Lumbar corset;Applied in standing position Restrictions Weight Bearing Restrictions: No      Mobility  Bed Mobility Overal bed mobility: Needs Assistance Bed Mobility: Rolling;Sidelying to Sit Rolling: Supervision Sidelying to sit: Supervision       General bed mobility comments: HOB elevated and heavy use of rails required. Increased time and effort to achieve full sit. VC's for proper log roll technique.    Transfers Overall transfer level: Needs assistance Equipment used: Rolling walker (2 wheeled) Transfers: Sit to/from Stand Sit to Stand: Min guard         General transfer comment: Hands-on guarding for safety as pt powered up to full stand. VC's for hand placement on seated surface prior to initiating stand.   Ambulation/Gait Ambulation/Gait assistance: Min guard Gait Distance (Feet): 75 Feet Assistive device: Rolling walker (2 wheeled) Gait Pattern/deviations: Step-through pattern;Decreased stride length;Trunk flexed Gait velocity: Decreased Gait velocity interpretation: <1.31 ft/sec, indicative of household ambulator General Gait Details: Very slow and guarded with several rest breaks. Pt tearful due to nausea. Hands-on guarding provided throughout gait training.   Stairs            Wheelchair Mobility    Modified Rankin (Stroke Patients Only)       Balance Overall balance assessment: Needs assistance Sitting-balance support: Feet supported;No upper extremity supported Sitting balance-Leahy Scale: Fair     Standing balance support: Bilateral upper extremity supported Standing balance-Leahy Scale: Poor Standing balance comment: Reliant on UE support for dynamic activity.                              Pertinent Vitals/Pain Pain Assessment: Faces Faces Pain Scale: Hurts even more Pain Location: Back, bilateral hips Pain Descriptors / Indicators: Operative site guarding;Pressure Pain Intervention(s): Limited activity within patient's tolerance;Monitored during session;Repositioned    Home Living Family/patient expects to be discharged to:: Private residence Living Arrangements: Spouse/significant other Available Help at Discharge: Family;Available 24 hours/day Type of Home: House Home Access: Stairs to enter Entrance Stairs-Rails: (Unclear - if no rails, pt has objects to hold to) Entrance Stairs-Number of Steps:  2 Home Layout: One level Home  Equipment: Walker - 2 wheels;Tub bench;Grab bars - tub/shower;Wheelchair - manual      Prior Function Level of Independence: Needs assistance      ADL's / Homemaking Assistance Needed: Pt required assist from husband for some ADL tasks        Hand Dominance        Extremity/Trunk Assessment   Upper Extremity Assessment Upper Extremity Assessment: Defer to OT evaluation    Lower Extremity Assessment Lower Extremity Assessment: Generalized weakness    Cervical / Trunk Assessment Cervical / Trunk Assessment: Other exceptions Cervical / Trunk Exceptions: s/p surgery  Communication   Communication: No difficulties  Cognition Arousal/Alertness: Awake/alert Behavior During Therapy: Anxious Overall Cognitive Status: Within Functional Limits for tasks assessed                                 General Comments: Nauseated throughout session and pt anxious/tearful because of it      General Comments      Exercises     Assessment/Plan    PT Assessment Patient needs continued PT services  PT Problem List Decreased strength;Decreased activity tolerance;Decreased balance;Decreased mobility;Decreased knowledge of use of DME;Decreased safety awareness;Decreased knowledge of precautions;Pain       PT Treatment Interventions DME instruction;Gait training;Functional mobility training;Therapeutic activities;Stair training;Therapeutic exercise;Balance training;Neuromuscular re-education;Patient/family education    PT Goals (Current goals can be found in the Care Plan section)  Acute Rehab PT Goals Patient Stated Goal: Decrease nausea PT Goal Formulation: With patient Time For Goal Achievement: 03/10/19 Potential to Achieve Goals: Good    Frequency Min 5X/week   Barriers to discharge        Co-evaluation               AM-PAC PT "6 Clicks" Mobility  Outcome Measure Help needed turning from your back to your side while in a flat bed without using  bedrails?: None Help needed moving from lying on your back to sitting on the side of a flat bed without using bedrails?: None Help needed moving to and from a bed to a chair (including a wheelchair)?: A Little Help needed standing up from a chair using your arms (e.g., wheelchair or bedside chair)?: A Little Help needed to walk in hospital room?: A Little Help needed climbing 3-5 steps with a railing? : A Little 6 Click Score: 20    End of Session Equipment Utilized During Treatment: Gait belt;Back brace Activity Tolerance: Patient tolerated treatment well Patient left: Other (comment)(Sitting EOB with OT present) Nurse Communication: Mobility status PT Visit Diagnosis: Unsteadiness on feet (R26.81);Difficulty in walking, not elsewhere classified (R26.2)    Time: 4888-9169 PT Time Calculation (min) (ACUTE ONLY): 17 min   Charges:   PT Evaluation $PT Eval Moderate Complexity: 1 Mod          Rolinda Roan, PT, DPT Acute Rehabilitation Services Pager: [phone] Office: [phone]   Thelma Comp 03/03/2019, 8:42 AM

## 2019-03-03 NOTE — Progress Notes (Addendum)
Inpatient Diabetes Program Recommendations  AACE/ADA: New Consensus Statement on Inpatient Glycemic Control (2015)  Target Ranges:  Prepandial:   less than 140 mg/dL      Peak postprandial:   less than 180 mg/dL (1-2 hours)      Critically ill patients:  140 - 180 mg/dL   Lab Results  Component Value Date   GLUCAP 217 (H) 03/03/2019   HGBA1C 7.1 (H) 02/25/2019    Review of Glycemic Control Results for Gloria Porter, Gloria Porter (MRN [id number]) as of 03/03/2019 11:24  Ref. Range 03/02/2019 16:55 03/02/2019 21:06 03/03/2019 00:14 03/03/2019 06:51  Glucose-Capillary Latest Ref Range: 70 - 99 mg/dL 287 (H) 480 (H) 269 (H) 217 (H)   Diabetes history: Type 2 DM Outpatient Diabetes medications: U-500 10-15 units QAM, QPM, Jardiance 25 mg QD, Ozempic 0.5 mg Qweek Current orders for Inpatient glycemic control: novolog 0-20 units TID, Novolog 0-5 units QHS, Invokana 100 mg QD novolog 15 units x 1 Decadron 6 mg x1  Inpatient Diabetes Program Recommendations:    Noted hyperglycemia of 480 mg/dL and subsequent dose of Novolog 15 units x1. Assuming this was partially related to steroids intra-op and appears patient had diet order placed. At this time, consider adding portion of patient's home dose of U-500- 5 units BID (to start tonight with dinner).   Thanks, Bronson Curb, MSN, RNC-OB Diabetes Coordinator [phone] (8a-5p)

## 2019-03-03 NOTE — Evaluation (Signed)
Occupational Therapy Evaluation Patient Details Name: Gloria Porter MRN: [id number] DOB: 08-04-1956 Today's Date: 03/03/2019    History of Present Illness Pt is a 63 y/o female who presents s/p L4-S1 on 03/02/2019. PMH significant for Sjogren's disease, neuropathy, hypothyroidism, HTN, fibromyalgia, DM, Cushing's disease s/p pituitary tumor removal in 2012, prior lumbar fusion.    Clinical Impression   PTA patient reports independent with mobility, requires assist for LB ADLs from spouse.  Admitted for above and limited by problem list below, including back precautions, pain, nausea, and impaired balance.  Patient currently requires mod-max assist for LB ADLs, min guard for transfers using RW, and min guard for short distance mobility.  She was educated on precautions, brace mgmt, ADL compensatory techniques and DME recommendations.  Will follow while admitted to maximize return to PLOF with ADLs/mobility but anticipate no further needs after dc.     Follow Up Recommendations  No OT follow up;Supervision/Assistance - 24 hour    Equipment Recommendations  3 in 1 bedside commode((if unable to borrow from family))    Recommendations for Other Services       Precautions / Restrictions Precautions Precautions: Fall;Back Precaution Booklet Issued: Yes (comment) Precaution Comments: Reviewed handout and pt was cued for precautions during functional mobility.  Required Braces or Orthoses: Spinal Brace Spinal Brace: Lumbar corset;Applied in standing position Restrictions Weight Bearing Restrictions: No      Mobility Bed Mobility Overal bed mobility: Needs Assistance Bed Mobility: Sit to Sidelying Rolling: Supervision Sidelying to sit: Supervision     Sit to sidelying: Supervision General bed mobility comments: returned to supine with supervision with verbal cueing for log roll techniques, use of rails required but HOB flat   Transfers Overall transfer level: Needs  assistance Equipment used: Rolling walker (2 wheeled) Transfers: Sit to/from Stand Sit to Stand: Min guard         General transfer comment: Hands-on guarding for safety as pt powered up to full stand. VC's for hand placement on seated surface prior to initiating stand.     Balance Overall balance assessment: Needs assistance Sitting-balance support: Feet supported;No upper extremity supported Sitting balance-Leahy Scale: Fair     Standing balance support: Bilateral upper extremity supported Standing balance-Leahy Scale: Poor Standing balance comment: Reliant on UE support for dynamic activity.                            ADL either performed or assessed with clinical judgement   ADL Overall ADL's : Needs assistance/impaired     Grooming: Min guard;Standing   Upper Body Bathing: Set up;Sitting   Lower Body Bathing: Moderate assistance;Sitting/lateral leans;Sit to/from stand Lower Body Bathing Details (indicate cue type and reason): requires assist with B LEs and buttocks, this is baseline  Upper Body Dressing : Set up;Sitting   Lower Body Dressing: Moderate assistance;Sit to/from stand Lower Body Dressing Details (indicate cue type and reason): requires assist due to precautions and decreased functional reach to feet- baseline, but requires min guard in standing  Toilet Transfer: Min guard;Ambulation Toilet Transfer Details (indicate cue type and reason): simulated in room using RW   Toileting - Clothing Manipulation Details (indicate cue type and reason): baseline uses AE for toileting, min guard for safety today      Functional mobility during ADLs: Min guard;Rolling walker General ADL Comments: pt limited by pain, anxiety, body habitus; educated on ADl compensatory techniques and precautions, pt plans to have spouse assist  Vision         Perception     Praxis      Pertinent Vitals/Pain Pain Assessment: Faces Faces Pain Scale: Hurts even  more Pain Location: Back, bilateral hips Pain Descriptors / Indicators: Operative site guarding;Pressure Pain Intervention(s): Limited activity within patient's tolerance;Monitored during session;Repositioned     Hand Dominance     Extremity/Trunk Assessment Upper Extremity Assessment Upper Extremity Assessment: Overall WFL for tasks assessed   Lower Extremity Assessment Lower Extremity Assessment: Defer to PT evaluation   Cervical / Trunk Assessment Cervical / Trunk Assessment: Other exceptions Cervical / Trunk Exceptions: s/p surgery   Communication Communication Communication: No difficulties   Cognition Arousal/Alertness: Awake/alert Behavior During Therapy: Anxious Overall Cognitive Status: Within Functional Limits for tasks assessed                                 General Comments: nauseated, anxious and tangential at times   General Comments       Exercises     Shoulder Instructions      Home Living Family/patient expects to be discharged to:: Private residence Living Arrangements: Spouse/significant other Available Help at Discharge: Family;Available 24 hours/day Type of Home: House Home Access: Stairs to enter [employer] of Steps: 2 Entrance Stairs-Rails: (Unclear - if no rails, pt has objects to hold to) Home Layout: One level     Bathroom Shower/Tub: Teacher, early years/pre: Standard     Home Equipment: Environmental consultant - 2 wheels;Tub bench;Grab bars - tub/shower;Wheelchair - manual(reports can borrow [facility] )          Prior Functioning/Environment Level of Independence: Needs assistance  Gait / Transfers Assistance Needed: independent with mobility  ADL's / Homemaking Assistance Needed: required assist for LB self care ADls            OT Problem List: Decreased activity tolerance;Impaired balance (sitting and/or standing);Decreased safety awareness;Decreased knowledge of use of DME or AE;Decreased knowledge of  precautions;Pain      OT Treatment/Interventions: Self-care/ADL training;Energy conservation;DME and/or AE instruction;Therapeutic activities;Balance training;Patient/family education    OT Goals(Current goals can be found in the care plan section) Acute Rehab OT Goals Patient Stated Goal: Decrease nausea OT Goal Formulation: With patient Time For Goal Achievement: 03/17/19 Potential to Achieve Goals: Good  OT Frequency: Min 2X/week   Barriers to D/C:            Co-evaluation              AM-PAC OT "6 Clicks" Daily Activity     Outcome Measure Help from another person eating meals?: None Help from another person taking care of personal grooming?: A Little Help from another person toileting, which includes using toliet, bedpan, or urinal?: A Lot Help from another person bathing (including washing, rinsing, drying)?: A Lot Help from another person to put on and taking off regular upper body clothing?: A Little Help from another person to put on and taking off regular lower body clothing?: A Lot 6 Click Score: 16   End of Session Equipment Utilized During Treatment: Gait belt;Rolling walker;Back brace Nurse Communication: Mobility status  Activity Tolerance: Patient tolerated treatment well Patient left: in bed;with call bell/phone within reach  OT Visit Diagnosis: Other abnormalities of gait and mobility (R26.89);Pain Pain - part of body: (back)                Time: 9030-0923 OT Time Calculation (min):  18 min Charges:  OT General Charges $OT Visit: 1 Visit OT Evaluation $OT Eval Moderate Complexity: [place], [place] Acute Rehabilitation Services Pager [phone] Office [phone]   Delight Stare 03/03/2019, 9:06 AM

## 2019-03-03 NOTE — Progress Notes (Signed)
Subjective: The patient is alert and pleasant.  Her back is appropriately sore.  She complains of nausea.  She says that she has taken hydrocodone with Phenergan in the past without nausea or vomiting.  Objective: Vital signs in last 24 hours: Temp:  [97 F (36.1 C)-99.2 F (37.3 C)] 98.8 F (37.1 C) (07/21 0724) Pulse Rate:  [67-78] 78 (07/21 0724) Resp:  [11-20] 18 (07/21 0724) BP: (103-130)/(34-86) 106/34 (07/21 0724) SpO2:  [91 %-100 %] 94 % (07/21 0724) Estimated body mass index is 49.32 kg/m as calculated from the following:   Height as of this encounter: 5' 2.5" (1.588 m).   Weight as of this encounter: 124.3 kg.   Intake/Output from previous day: 07/20 0701 - 07/21 0700 In: 2383 [I.V.:1753; Blood:130; IV Piggyback:500] Out: 1870 [XIPJA:2505; Emesis/NG output:300; Blood:300] Intake/Output this shift: No intake/output data recorded.  Physical exam the patient is alert and pleasant.  She is moving her lower extremities well.  Lab Results: Recent Labs    03/03/19 0644  WBC 11.2*  HGB 11.4*  HCT 36.9  PLT 130*   BMET No results for input(s): NA, K, CL, CO2, GLUCOSE, BUN, CREATININE, CALCIUM in the last 72 hours.  Studies/Results: Dg Lumbar Spine 2-3 Views  Result Date: 03/02/2019 CLINICAL DATA:  L4-S1 PLIF for spinal stenosis. FLUOROSCOPY TIME:  31 seconds. Images: 2 EXAM: DG C-ARM 61-120 MIN COMPARISON:  None. FINDINGS: Pedicle screws have been placed at L4, L5, and S1. Disc spacer devices are seen at both levels as well in good position. Posterior retractor is identified. IMPRESSION: Surgical changes as above. Electronically Signed   By: Dorise Bullion III M.D   On: 03/02/2019 11:48   Dg C-arm 1-60 Min  Result Date: 03/02/2019 CLINICAL DATA:  L4-S1 PLIF for spinal stenosis. FLUOROSCOPY TIME:  31 seconds. Images: 2 EXAM: DG C-ARM 61-120 MIN COMPARISON:  None. FINDINGS: Pedicle screws have been placed at L4, L5, and S1. Disc spacer devices are seen at both levels as  well in good position. Posterior retractor is identified. IMPRESSION: Surgical changes as above. Electronically Signed   By: Dorise Bullion III M.D   On: 03/02/2019 11:47    Assessment/Plan: Postop day #1: We will continue to mobilize the patient with PT and OT.  She may go home tomorrow.  I will add Norco and Phenergan for pain control.  LOS: 1 day     Gloria Porter 03/03/2019, 7:32 AM

## 2019-03-03 NOTE — Progress Notes (Signed)
Pt's blood sugar checked at bedtime was 480; a one time order of 15 units Novolog ordered and a bedtime coverage was added to her previously ordered sliding scale. CBG was rechecked after 2 hours around midnight and trended down to 269. Will continue to monitor patient.

## 2019-03-04 LAB — GLUCOSE, CAPILLARY
Glucose-Capillary: 215 mg/dL — ABNORMAL HIGH (ref 70–99)
Glucose-Capillary: 213 mg/dL — ABNORMAL HIGH (ref 70–99)
Glucose-Capillary: 183 mg/dL — ABNORMAL HIGH (ref 70–99)
Glucose-Capillary: 174 mg/dL — ABNORMAL HIGH (ref 70–99)

## 2019-03-04 MED FILL — Sodium Chloride IV Soln 0.9%: INTRAVENOUS | Qty: 2000 | Status: AC

## 2019-03-04 MED FILL — Heparin Sodium (Porcine) Inj 1000 Unit/ML: INTRAMUSCULAR | Qty: 30 | Status: AC

## 2019-03-04 NOTE — Progress Notes (Signed)
Patient is transferred from room 3C02 to unit 4W13 at this time. Alert and in stable condition. Report given to receiving nurse Merleen Nicely, RN with all questions answered. Left unit via bed with all belongings at side.

## 2019-03-04 NOTE — TOC Initial Note (Signed)
Transition of Care [facility]) - Initial/Assessment Note    Patient Details  Name: Gloria Porter MRN: [id number] Date of Birth: [birth year]  [facility]) CM/SW Contact:    Benard Halsted, LCSW Phone Number: 03/04/2019, 8:36 AM  Clinical Narrative:                 CSW received consult for possible home health services at time of discharge. CSW spoke with patient regarding PT recommendation of Home Health PT at time of discharge. Patient reported that she would like home health services. [facility] able to accept patient. CSW confirmed PCP (Dr. Caryl Comes, [place] [phone])  and address with patient. Patient states her husband will come pick her up at discharge. No further questions reported at this time. CSW to continue to follow and assist with discharge planning needs.   Expected Discharge Plan: [place] Barriers to Discharge: No Barriers Identified   Patient Goals and CMS Choice Patient states their goals for this hospitalization and ongoing recovery are:: Get stronger at home CMS Medicare.gov Compare Post Acute Care list provided to:: Patient Choice offered to / list presented to : Patient  Expected Discharge Plan and Services Expected Discharge Plan: [place] In-house Referral: NA Discharge Planning Services: CM Consult Post Acute Care Choice: [place] arrangements for the past 2 months: Single Family Home                 DME Arranged: N/A(RN arranging)         HH Arranged: PT, OT HH Agency: [facility] Date [place]: 03/03/19 Time HH Agency Contacted: 1300 Representative spoke with at [place]: Georgina Snell  Prior Living Arrangements/Services Living arrangements for the past 2 months: [place] with:: Spouse Patient language and need for interpreter reviewed:: Yes Do you feel safe going back to the place where you live?: Yes      Need for Family Participation in  Patient Care: No (Comment) Care giver support system in place?: Yes (comment)   Criminal Activity/Legal Involvement Pertinent to Current Situation/Hospitalization: No - Comment as needed  Activities of Daily Living      Permission Sought/Granted Permission sought to share information with : Case Manager Permission granted to share information with : Yes, Verbal Permission Granted     Permission granted to share info w AGENCY: Home Health        Emotional Assessment Appearance:: Appears stated age Attitude/Demeanor/Rapport: Gracious, Engaged Affect (typically observed): Accepting, Appropriate, Pleasant Orientation: : Oriented to Situation, Oriented to  Time, Oriented to Place, Oriented to Self Alcohol / Substance Use: Not Applicable Psych Involvement: No (comment)  Admission diagnosis:  SPINAL STENOSIS, LUMBAR REGION WITH NEUROGENIC CLAUDICATION Patient Active Problem List   Diagnosis Date Noted  . Pseudoarthrosis of lumbar spine 03/02/2019  . Primary osteoarthritis of both hands 04/10/2017  . Primary osteoarthritis of both knees 04/10/2017  . DDD (degenerative disc disease), lumbar 04/10/2017  . Mixed connective tissue disease ([place]) 04/10/2017  . S/P trigger finger release bilateral third 03/14/2017  . MGUS (monoclonal gammopathy of unknown significance) 03/11/2017  . History of chronic kidney disease 03/11/2017  . Monoclonal paraproteinemia 01/11/2016  . Restless leg 01/11/2015  . Carpal tunnel syndrome 12/28/2014  . Dysphagia, oropharyngeal 12/28/2014  . Anxiety, generalized 10/14/2014  . Generalized joint pain 08/17/2014  . Encounter for therapeutic drug monitoring 07/13/2014  . Failed back syndrome of lumbar spine 07/13/2014  . Encounter  for therapeutic drug level monitoring 07/13/2014  . 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 Diabetes ([place]) 06/16/2014  . Fibrositis 06/16/2014  . Cannot sleep 06/16/2014  . Extreme obesity 06/16/2014  . Neuropathy 06/16/2014  .  Allergic rhinitis, seasonal 06/16/2014  . Post menopausal syndrome 06/16/2014  . 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 Spinal stenosis 06/16/2014  . Adult hypothyroidism 06/16/2014  . 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[place]) 06/16/2014  . Diabetes mellitus ([place]) 06/16/2014  . Fibromyalgia 06/16/2014  . Morbid obesity ([place]) 06/16/2014  . Sjogren's syndrome ([place]) 06/16/2014  . Postmenopausal HRT (hormone replacement therapy) 06/16/2014  . Breath shortness 01/21/2014  . Essential (primary) hypertension 01/21/2014  . Acid reflux 01/21/2014  . Adiposity 01/21/2014  . Clinical depression 08/26/2013  . Obstructive apnea 01/17/2013  . Cushing's syndrome ([place]) 05/23/2011  . Pituitary adenoma ([place]) 05/23/2011   PCP:  Florestine Avers (Inactive) Pharmacy:   [facility] - [address], [place] [address] Phone: [phone] Fax: [facility] #[id number] [place], [place] [address] [place] [address] Phone: [phone] Fax: [phone]  [facility] [place], [place] [address] [place] [address] Phone: [phone] Fax: [phone]     Social Determinants of Health (SDOH) Interventions    Readmission Risk Interventions No flowsheet data found.

## 2019-03-04 NOTE — Progress Notes (Signed)
Physical Therapy Treatment Patient Details Name: Gloria Porter MRN: [id number] DOB: Jan 15, 1956 Today's Date: 03/04/2019    History of Present Illness Pt is a 63 y/o female who presents s/p L4-S1 on 03/02/2019. PMH significant for Sjogren's disease, neuropathy, hypothyroidism, HTN, fibromyalgia, DM, Cushing's disease s/p pituitary tumor removal in 2012, prior lumbar fusion.     PT Comments    Pt progressing slowly with post-op mobility. Pt reports increased pain this session and was not able to tolerate as much functional mobility as she did on eval. Pt tearful and anxious throughout session. Max assist required for peri-care after toileting (pt refusing to attempt to clean herself - says she can't and demanded therapist do it). Will continue to follow. If pt does not show functional improvement, may require SNF at d/c. Changed recommendation to prepare for this possibility.    Follow Up Recommendations  SNF;Supervision for mobility/OOB     Equipment Recommendations  Other (comment)(Brace extension vs new/larger brace)    Recommendations for Other Services       Precautions / Restrictions Precautions Precautions: Fall;Back Precaution Booklet Issued: Yes (comment) Precaution Comments: Reviewed handout and pt was cued for precautions during functional mobility.  Required Braces or Orthoses: Spinal Brace Spinal Brace: Lumbar corset;Applied in standing position Restrictions Weight Bearing Restrictions: No    Mobility  Bed Mobility Overal bed mobility: Needs Assistance Bed Mobility: Sidelying to Sit;Sit to Sidelying   Sidelying to sit: Min assist     Sit to sidelying: Min guard General bed mobility comments: Slow, effortful, and painful with heavy use of rails.   Transfers Overall transfer level: Needs assistance Equipment used: Rolling walker (2 wheeled) Transfers: Sit to/from Stand Sit to Stand: Min assist;From elevated surface         General transfer comment:  Required bed to be elevated to achieve stand. Pt required min assist as well.   Ambulation/Gait Ambulation/Gait assistance: Min guard Gait Distance (Feet): 20 Feet Assistive device: Rolling walker (2 wheeled) Gait Pattern/deviations: Step-through pattern;Decreased stride length;Trunk flexed Gait velocity: Decreased Gait velocity interpretation: <1.31 ft/sec, indicative of household ambulator General Gait Details: Very slow and guarded to/from bathroom. Pt refusing any further mobility due to pain.    Stairs             Wheelchair Mobility    Modified Rankin (Stroke Patients Only)       Balance Overall balance assessment: Needs assistance Sitting-balance support: Feet supported;No upper extremity supported Sitting balance-Leahy Scale: Fair     Standing balance support: Bilateral upper extremity supported Standing balance-Leahy Scale: Poor Standing balance comment: Reliant on UE support for dynamic activity.                             Cognition Arousal/Alertness: Awake/alert Behavior During Therapy: Anxious Overall Cognitive Status: Within Functional Limits for tasks assessed                                 General Comments: nauseated, anxious and tangential at times      Exercises      General Comments        Pertinent Vitals/Pain Pain Assessment: Faces Faces Pain Scale: Hurts even more Pain Location: Back, bilateral hips/legs Pain Descriptors / Indicators: Operative site guarding;Pressure Pain Intervention(s): Limited activity within patient's tolerance;Monitored during session;Premedicated before session;Repositioned    Home Living Family/patient expects to be discharged to:: Private  residence Living Arrangements: Spouse/significant other Available Help at Discharge: Family;Available 24 hours/day Type of Home: House              Prior Function            PT Goals (current goals can now be found in the care plan  section) Acute Rehab PT Goals PT Goal Formulation: With patient Time For Goal Achievement: 03/10/19 Potential to Achieve Goals: Good Progress towards PT goals: Not progressing toward goals - comment(Increased assist required. Pt more painful)    Frequency    Min 5X/week      PT Plan Discharge plan needs to be updated    Co-evaluation              AM-PAC PT "6 Clicks" Mobility   Outcome Measure  Help needed turning from your back to your side while in a flat bed without using bedrails?: A Little Help needed moving from lying on your back to sitting on the side of a flat bed without using bedrails?: A Little Help needed moving to and from a bed to a chair (including a wheelchair)?: A Little Help needed standing up from a chair using your arms (e.g., wheelchair or bedside chair)?: A Little Help needed to walk in hospital room?: A Little Help needed climbing 3-5 steps with a railing? : A Lot 6 Click Score: 17    End of Session Equipment Utilized During Treatment: Gait belt;Back brace Activity Tolerance: Patient tolerated treatment well Patient left: in bed;with call bell/phone within reach Nurse Communication: Mobility status PT Visit Diagnosis: Unsteadiness on feet (R26.81);Difficulty in walking, not elsewhere classified (R26.2)     Time: 8811-0315 PT Time Calculation (min) (ACUTE ONLY): 13 min  Charges:  $Gait Training: 8-22 mins                     Rolinda Roan, PT, DPT Acute Rehabilitation Services Pager: [phone] Office: [phone]    Thelma Comp 03/04/2019, 10:20 AM

## 2019-03-04 NOTE — Progress Notes (Signed)
OT Cancellation Note  Patient Details Name: NIRA VISSCHER MRN: [id number] DOB: 04-12-1956   Cancelled Treatment:    Reason Eval/Treat Not Completed: Pain limiting ability to participate, patient in 10/10 pain and just received medication.  Will follow and see as able today.   Delight Stare, OT Acute Rehabilitation Services Pager [phone] Office [phone]    Delight Stare 03/04/2019, 8:29 AM

## 2019-03-04 NOTE — Progress Notes (Signed)
Patient states she did not want any lunch because she would have to lay on her back. She wanted applesauce instead. Nurse will continue to monitor. Gloria Porter

## 2019-03-04 NOTE — Progress Notes (Signed)
Orthopedic Tech Progress Note Patient Details:  ALEAN KROMER [birth year] [id number]  Patient ID: [name], female   DOB: February 19, 1956, 63 y.o.   MRN: [id number]   [place] 03/04/2019, [facility] [employer] for Lumbar brace.

## 2019-03-04 NOTE — Progress Notes (Signed)
Occupational Therapy Treatment Patient Details Name: Gloria Porter MRN: [id number] DOB: 1956-02-02 Today's Date: 03/04/2019    History of present illness Pt is a 63 y/o female who presents s/p L4-S1 on 03/02/2019. PMH significant for Sjogren's disease, neuropathy, hypothyroidism, HTN, fibromyalgia, DM, Cushing's disease s/p pituitary tumor removal in 2012, prior lumbar fusion.    OT comments  Patient sidelying in bed and agreeable to OT. Continues to be limited by pain and nausea. Requires min assist for bed mobility, min assist for sit to stand transfers.  Limited tolerance to OOB activity today.  Patient progressing more slowly then expected, and updated dc recommendations to SNF. If pain becomes more controlled, maybe able to dc home with HHOT and 24/7 support.    Follow Up Recommendations  SNF;Supervision/Assistance - 24 hour    Equipment Recommendations  3 in 1 bedside commode(if unable to borrow from family)    Recommendations for Other Services      Precautions / Restrictions Precautions Precautions: Fall;Back Precaution Booklet Issued: Yes (comment) Precaution Comments: reviewed precautions with pt  Required Braces or Orthoses: Spinal Brace Spinal Brace: Lumbar corset;Applied in sitting position Restrictions Weight Bearing Restrictions: No       Mobility Bed Mobility Overal bed mobility: Needs Assistance Bed Mobility: Sidelying to Sit;Sit to Sidelying   Sidelying to sit: Min assist     Sit to sidelying: Min guard General bed mobility comments: min assist to ascend from sidelying today (already laying on R side); increaed time and effort with heavy use of rails   Transfers Overall transfer level: Needs assistance Equipment used: Rolling walker (2 wheeled) Transfers: Sit to/from Stand Sit to Stand: Min assist         General transfer comment: min assist to ascend from EOB, cueing for hand placement; increased time and effort    Balance Overall balance  assessment: Needs assistance Sitting-balance support: Feet supported;No upper extremity supported Sitting balance-Leahy Scale: Fair     Standing balance support: Bilateral upper extremity supported;During functional activity Standing balance-Leahy Scale: Poor Standing balance comment: Reliant on UE support                           ADL either performed or assessed with clinical judgement   ADL Overall ADL's : Needs assistance/impaired     Grooming: Wash/dry face;Set up;Sitting           Upper Body Dressing : Supervision/safety;Set up;Sitting Upper Body Dressing Details (indicate cue type and reason): min assist for brace                 Functional mobility during ADLs: Minimal assistance;Rolling walker General ADL Comments: pt limited by pain and nausea      Vision       Perception     Praxis      Cognition Arousal/Alertness: Awake/alert Behavior During Therapy: Anxious Overall Cognitive Status: Within Functional Limits for tasks assessed                                 General Comments: nauseated, anxious and tangential at times        Exercises     Shoulder Instructions       General Comments      Pertinent Vitals/ Pain       Pain Assessment: Faces Faces Pain Scale: Hurts even more Pain Location: Back, bilateral hips/legs Pain Descriptors / Indicators:  Operative site guarding;Pressure Pain Intervention(s): Limited activity within patient's tolerance;Monitored during session;Premedicated before session;Repositioned  Home Living                                          Prior Functioning/Environment              Frequency  Min 2X/week        Progress Toward Goals  OT Goals(current goals can now be found in the care plan section)  Progress towards OT goals: Progressing toward goals  Acute Rehab OT Goals Patient Stated Goal: Decrease nausea OT Goal Formulation: With patient  Plan  Discharge plan needs to be updated;Frequency remains appropriate    Co-evaluation                 AM-PAC OT "6 Clicks" Daily Activity     Outcome Measure   Help from another person eating meals?: None Help from another person taking care of personal grooming?: A Little Help from another person toileting, which includes using toliet, bedpan, or urinal?: A Lot Help from another person bathing (including washing, rinsing, drying)?: A Lot Help from another person to put on and taking off regular upper body clothing?: A Little Help from another person to put on and taking off regular lower body clothing?: A Lot 6 Click Score: 16    End of Session Equipment Utilized During Treatment: Rolling walker;Back brace;Oxygen  OT Visit Diagnosis: Other abnormalities of gait and mobility (R26.89);Pain Pain - part of body: (back)   Activity Tolerance Patient limited by pain   Patient Left in bed;with call bell/phone within reach;with bed alarm set   Nurse Communication          Time: 7903-8333 OT Time Calculation (min): 28 min  Charges: OT General Charges $OT Visit: 1 Visit OT Treatments $Self Care/Home Management : 23-37 mins  Delight Stare, [place] Pager [phone] Office [phone]    Delight Stare 03/04/2019, 4:47 PM

## 2019-03-04 NOTE — Progress Notes (Signed)
Patient arrived to 3w. Alert and oriented x4. States pain 8/10. Surgical incision intact with honeycomb dressing. Call bell within reach. Bed in lowest position. Nurse will continue to monitor. [place]

## 2019-03-04 NOTE — Progress Notes (Signed)
Inpatient Diabetes Program Recommendations  AACE/ADA: New Consensus Statement on Inpatient Glycemic Control (2015)  Target Ranges:  Prepandial:   less than 140 mg/dL      Peak postprandial:   less than 180 mg/dL (1-2 hours)      Critically ill patients:  140 - 180 mg/dL   Results for TEQUISHA, MAAHS (MRN [id number]) as of 03/04/2019 14:09  Ref. Range 03/03/2019 06:51 03/03/2019 11:19 03/03/2019 16:17 03/03/2019 20:13 03/04/2019 06:06 03/04/2019 12:03  Glucose-Capillary Latest Ref Range: 70 - 99 mg/dL 217 (H) 227 (H) 219 (H) 198 (H) 215 (H) 213 (H)    Review of Glycemic Control  Diabetes history: Dm 2 Outpatient Diabetes medications: Concentrated Humulin R U-500 insulin 10-15 units QAM, QPM, Jardiance 25 mg QD, Ozempic 0.5 mg Qweek  Current orders for Inpatient glycemic control:  Novolog 0-20 units TID, Novolog 0-5 units QHS, Novolog 5 units bid with meals, Invokana 100 mg QD  Inpatient Diabetes Program Recommendations:    Glucose in the 200's. Consider Lantus 20 units.  Thanks,  Tama Headings RN, MSN, BC-ADM Inpatient Diabetes Coordinator Team Pager [phone] (8a-5p)

## 2019-03-05 LAB — GLUCOSE, CAPILLARY
Glucose-Capillary: 198 mg/dL — ABNORMAL HIGH (ref 70–99)
Glucose-Capillary: 178 mg/dL — ABNORMAL HIGH (ref 70–99)
Glucose-Capillary: 194 mg/dL — ABNORMAL HIGH (ref 70–99)
Glucose-Capillary: 206 mg/dL — ABNORMAL HIGH (ref 70–99)

## 2019-03-05 MED ORDER — GABAPENTIN 300 MG PO CAPS
300.0000 mg | ORAL_CAPSULE | Freq: Three times a day (TID) | ORAL | Status: DC
  Administered 2019-03-05 – 2019-03-06 (×3): 300 mg via ORAL
  Filled 2019-03-05 (×4): qty 1

## 2019-03-05 MED ORDER — DEXAMETHASONE SODIUM PHOSPHATE 4 MG/ML IJ SOLN
4.0000 mg | Freq: Once | INTRAMUSCULAR | Status: AC
  Administered 2019-03-05: 4 mg via INTRAVENOUS
  Filled 2019-03-05: qty 1

## 2019-03-05 NOTE — Progress Notes (Signed)
Physical Therapy Treatment Patient Details Name: Gloria Porter MRN: [id number] DOB: [birth year] Today's Date: 03/05/2019    History of Present Illness Pt is a 63 y/o female who presents s/p L4-S1 on 03/02/2019. PMH significant for Sjogren's disease, neuropathy, hypothyroidism, HTN, fibromyalgia, DM, Cushing's disease s/p pituitary tumor removal in 2012, prior lumbar fusion.     PT Comments    Pt progressing towards physical therapy goals. Much improved from yesterday's session however overall tolerance for functional activity remains decreased. Because of this pt unable to make it to the rehab gym for stair training and too fatigued to be rolled there in the chair after we returned to the room. Pt appears to have a significant functional decline from prior to surgery and recommend another PT session prior to return home. Will continue to follow.    Follow Up Recommendations  SNF;Supervision for mobility/OOB     Equipment Recommendations  None recommended by PT    Recommendations for Other Services       Precautions / Restrictions Precautions Precautions: Fall;Back Precaution Booklet Issued: Yes (comment) Precaution Comments: reviewed precautions with pt  Required Braces or Orthoses: Spinal Brace Spinal Brace: Lumbar corset;Applied in sitting position Restrictions Weight Bearing Restrictions: No    Mobility  Bed Mobility Overal bed mobility: Needs Assistance Bed Mobility: Sit to Sidelying;Rolling Rolling: Supervision       Sit to sidelying: Min guard General bed mobility comments: No assist was required for pt to transition back to sidelying position, however increased time and close guard provided. VC's throughout for log roll technique.   Transfers Overall transfer level: Needs assistance Equipment used: Rolling walker (2 wheeled) Transfers: Sit to/from Stand Sit to Stand: Min guard         General transfer comment: Close guard for safety as pt powered up to  full stand. VC's for hand placement on seated surface for safety.   Ambulation/Gait Ambulation/Gait assistance: Min guard Gait Distance (Feet): 70 Feet Assistive device: Rolling walker (2 wheeled) Gait Pattern/deviations: Step-through pattern;Decreased stride length;Trunk flexed Gait velocity: Decreased Gait velocity interpretation: <1.31 ft/sec, indicative of household ambulator General Gait Details: Very slow and guarded. Pt not able to tolerate increased distance this session.    Stairs             Wheelchair Mobility    Modified Rankin (Stroke Patients Only)       Balance Overall balance assessment: Needs assistance Sitting-balance support: Feet supported;No upper extremity supported Sitting balance-Leahy Scale: Fair     Standing balance support: Bilateral upper extremity supported;During functional activity Standing balance-Leahy Scale: Poor Standing balance comment: Reliant on UE support                            Cognition Arousal/Alertness: Awake/alert Behavior During Therapy: Anxious Overall Cognitive Status: Within Functional Limits for tasks assessed                                 General Comments: tangential at times      Exercises      General Comments        Pertinent Vitals/Pain Pain Assessment: Faces Faces Pain Scale: Hurts little more Pain Location: Back, bilateral hips/legs Pain Descriptors / Indicators: Operative site guarding;Pressure Pain Intervention(s): Monitored during session    Home Living  Prior Function            PT Goals (current goals can now be found in the care plan section) Acute Rehab PT Goals Patient Stated Goal: Home at d/c PT Goal Formulation: With patient Time For Goal Achievement: 03/10/19 Potential to Achieve Goals: Good Progress towards PT goals: Progressing toward goals    Frequency    Min 5X/week      PT Plan Discharge plan needs to be  updated    Co-evaluation              AM-PAC PT "6 Clicks" Mobility   Outcome Measure  Help needed turning from your back to your side while in a flat bed without using bedrails?: A Little Help needed moving from lying on your back to sitting on the side of a flat bed without using bedrails?: A Little Help needed moving to and from a bed to a chair (including a wheelchair)?: A Little Help needed standing up from a chair using your arms (e.g., wheelchair or bedside chair)?: A Little Help needed to walk in hospital room?: A Little Help needed climbing 3-5 steps with a railing? : A Lot 6 Click Score: 17    End of Session Equipment Utilized During Treatment: Gait belt;Back brace Activity Tolerance: Patient tolerated treatment well Patient left: in bed;with call bell/phone within reach Nurse Communication: Mobility status PT Visit Diagnosis: Unsteadiness on feet (R26.81);Difficulty in walking, not elsewhere classified (R26.2)     Time: 4854-6270 PT Time Calculation (min) (ACUTE ONLY): 44 min  Charges:  $Gait Training: 38-52 mins                     Rolinda Roan, PT, DPT Acute Rehabilitation Services Pager: [phone] Office: [phone]    Thelma Comp 03/05/2019, 3:27 PM

## 2019-03-05 NOTE — Progress Notes (Addendum)
Subjective: The patient is alert and pleasant.  Her back is appropriately sore.  She complains of bilateral aching in her legs.  She feels like she is doing better on Norco and Phenergan for her nausea.  Objective: Vital signs in last 24 hours: Temp:  [98.4 F (36.9 C)-99.8 F (37.7 C)] 98.4 F (36.9 C) (07/23 0000) Pulse Rate:  [79-90] 79 (07/23 0000) Resp:  [16-24] 18 (07/23 0000) BP: (115-141)/(39-53) 115/39 (07/23 0000) SpO2:  [92 %-95 %] 93 % (07/23 0000) Estimated body mass index is 49.32 kg/m as calculated from the following:   Height as of this encounter: 5' 2.5" (1.588 m).   Weight as of this encounter: 124.3 kg.   Intake/Output from previous day: 07/22 0701 - 07/23 0700 In: 180 [P.O.:180] Out: -  Intake/Output this shift: Total I/O In: 180 [P.O.:180] Out: -   Physical exam the patient is alert and pleasant.  Her lower extremity strength is grossly normal.  Her dressing is clean and dry.  Lab Results: Recent Labs    03/03/19 0644  WBC 11.2*  HGB 11.4*  HCT 36.9  PLT 130*   BMET Recent Labs    03/03/19 0644  NA 136  K 4.5  CL 102  CO2 25  GLUCOSE 231*  BUN 24*  CREATININE 1.23*  CALCIUM 8.6*    Studies/Results: No results found.  Assessment/Plan: Postop day #3: The patient is doing better today.  She may go home later on today.  We will send her home with Norco, Flexeril, and Phenergan.  I gave her discharge instructions and answered all her questions.  I gave her a dose of Decadron for presumed inflammation.  I will start Neurontin for her chronic leg pain.    LOS: 3 days     Ophelia Charter 03/05/2019, 2:19 AM

## 2019-03-06 LAB — GLUCOSE, CAPILLARY
Glucose-Capillary: 191 mg/dL — ABNORMAL HIGH (ref 70–99)
Glucose-Capillary: 260 mg/dL — ABNORMAL HIGH (ref 70–99)

## 2019-03-06 MED ORDER — DOCUSATE SODIUM 100 MG PO CAPS: 100.0000 mg | ORAL_CAPSULE | Freq: Two times a day (BID) | ORAL | 0 refills | Status: DC

## 2019-03-06 MED ORDER — CYCLOBENZAPRINE HCL 10 MG PO TABS: 10.0000 mg | ORAL_TABLET | Freq: Three times a day (TID) | ORAL | 0 refills | Status: DC | PRN

## 2019-03-06 MED ORDER — PROMETHAZINE HCL 12.5 MG PO TABS: 12.5000 mg | ORAL_TABLET | Freq: Four times a day (QID) | ORAL | 0 refills | Status: DC | PRN

## 2019-03-06 MED ORDER — HYDROCODONE-ACETAMINOPHEN 10-325 MG PO TABS: 1.0000 | ORAL_TABLET | ORAL | 0 refills | Status: DC | PRN

## 2019-03-06 MED ORDER — GABAPENTIN 300 MG PO CAPS: 300.0000 mg | ORAL_CAPSULE | Freq: Three times a day (TID) | ORAL | 0 refills | Status: DC

## 2019-03-06 NOTE — Progress Notes (Signed)
Physical Therapy Treatment Patient Details Name: Gloria Porter MRN: [id number] DOB: 1956/06/27 Today's Date: 03/06/2019    History of Present Illness Pt is a 63 y/o female who presents s/p L4-S1 on 03/02/2019. PMH significant for Sjogren's disease, neuropathy, hypothyroidism, HTN, fibromyalgia, DM, Cushing's disease s/p pituitary tumor removal in 2012, prior lumbar fusion.     PT Comments    Patient seen for mobility progression. Pt requires min guard/min A for mobility this session. Pt continues to have decreased activity tolerance. Pt is able to ascend/descend two steps with bilat rails and then required sitting break due to increased pain and nausea. Pt taken back to room in recliner and then able to ambulate ~20 ft total to/from bathroom. Continue to progress as tolerated.     Follow Up Recommendations  SNF;Supervision for mobility/OOB     Equipment Recommendations  None recommended by PT    Recommendations for Other Services       Precautions / Restrictions Precautions Precautions: Fall;Back Precaution Comments: reviewed precautions with pt  Required Braces or Orthoses: Spinal Brace Spinal Brace: Lumbar corset;Applied in sitting position Restrictions Weight Bearing Restrictions: No    Mobility  Bed Mobility Overal bed mobility: Needs Assistance Bed Mobility: Sit to Sidelying Rolling: Supervision Sidelying to sit: Supervision     Sit to sidelying: Min guard General bed mobility comments: min guard for safety; cues for maintaining back precuations  Transfers Overall transfer level: Needs assistance Equipment used: Rolling walker (2 wheeled) Transfers: Sit to/from [facility] Sit to Stand: Min guard Stand pivot transfers: Min guard       General transfer comment: assist to stabilize RW   Ambulation/Gait Ambulation/Gait assistance: Min guard Gait Distance (Feet): 20 Feet Assistive device: Rolling walker (2 wheeled) Gait Pattern/deviations:  Step-through pattern;Decreased stride length;Trunk flexed Gait velocity: Decreased   General Gait Details: heavy reliance on bilat UE support; slow cadence; cues for upright posture and increased stride length   Stairs Stairs: Yes Stairs assistance: Min assist Stair Management: Two rails;Step to pattern;Forwards;Backwards;With walker Number of Stairs: (2 steps X 2 trials) General stair comments: cues for sequencing and safety; patient closing eyes and reports it's due to increased pain and then c/o nausea and quickly returned to sitting in recliner   Wheelchair Mobility    Modified Rankin (Stroke Patients Only)       Balance Overall balance assessment: Needs assistance Sitting-balance support: Feet supported Sitting balance-Leahy Scale: Good     Standing balance support: Bilateral upper extremity supported;During functional activity Standing balance-Leahy Scale: Poor Standing balance comment: Patient needs BUE support for balance secondary to weakness and increased back pain.                            Cognition Arousal/Alertness: Awake/alert Behavior During Therapy: WFL for tasks assessed/performed Overall Cognitive Status: Within Functional Limits for tasks assessed                                 General Comments: tangential at times      Exercises      General Comments        Pertinent Vitals/Pain Pain Assessment: 0-10 Pain Score: 7  Pain Location: Back, bilateral hips/legs Pain Descriptors / Indicators: Aching;Grimacing;Guarding;Sore Pain Intervention(s): Limited activity within patient's tolerance;Monitored during session;Repositioned    Home Living  Prior Function            PT Goals (current goals can now be found in the care plan section) Progress towards PT goals: Progressing toward goals    Frequency    Min 5X/week      PT Plan Current plan remains appropriate    Co-evaluation               AM-PAC PT "6 Clicks" Mobility   Outcome Measure  Help needed turning from your back to your side while in a flat bed without using bedrails?: A Little Help needed moving from lying on your back to sitting on the side of a flat bed without using bedrails?: A Little Help needed moving to and from a bed to a chair (including a wheelchair)?: A Little Help needed standing up from a chair using your arms (e.g., wheelchair or bedside chair)?: A Little Help needed to walk in hospital room?: A Little Help needed climbing 3-5 steps with a railing? : A Lot 6 Click Score: 17    End of Session Equipment Utilized During Treatment: Gait belt;Back brace Activity Tolerance: Patient limited by pain Patient left: in bed;with call bell/phone within reach Nurse Communication: Mobility status PT Visit Diagnosis: Unsteadiness on feet (R26.81);Difficulty in walking, not elsewhere classified (R26.2)     Time: 2229-7989 PT Time Calculation (min) (ACUTE ONLY): 26 min  Charges:  $Gait Training: 23-37 mins                     Earney Navy, PTA Acute Rehabilitation Services Pager: [phone] Office: [phone]     KITT MINARDI 03/06/2019, 3:19 PM

## 2019-03-06 NOTE — Progress Notes (Signed)
CSW met with patient to discuss current recommendation for SNF placement, and patient refusing. Patient will discharge home with home health services.   Laveda Abbe, [place] Clinical Social Worker [phone]

## 2019-03-06 NOTE — Discharge Summary (Addendum)
Physician Discharge Summary    Providing Compassionate, Quality Care - Together   Patient ID: Gloria Porter MRN: [id number] DOB/AGE: 1956-03-31 63 y.o.  Admit date: 03/02/2019 Discharge date: 03/06/2019  Admission Diagnoses: Pseudoarthrosis of lumbar spine  Discharge Diagnoses:  Active Problems:   Pseudoarthrosis of lumbar spine   Discharged Condition: good  Hospital Course: Patient was admitted on 03/02/2019 after undergoing L5-S1 posterior lumbar decompression and interbody fusion. She worked with physical and occupational therapies over the next three days to increase her endurance and strength. Her pain is adequately controlled. She is ready to discharge home with home health services.  Consults: rehabilitation medicine  Significant Diagnostic Studies: radiology: Dg Lumbar Spine 2-3 Views  Result Date: 03/02/2019 CLINICAL DATA:  L4-S1 PLIF for spinal stenosis. FLUOROSCOPY TIME:  31 seconds. Images: 2 EXAM: DG C-ARM 61-120 MIN COMPARISON:  None. FINDINGS: Pedicle screws have been placed at L4, L5, and S1. Disc spacer devices are seen at both levels as well in good position. Posterior retractor is identified. IMPRESSION: Surgical changes as above. Electronically Signed   By: Dorise Bullion III M.D   On: 03/02/2019 11:48   Dg C-arm 1-60 Min  Result Date: 03/02/2019 CLINICAL DATA:  L4-S1 PLIF for spinal stenosis. FLUOROSCOPY TIME:  31 seconds. Images: 2 EXAM: DG C-ARM 61-120 MIN COMPARISON:  None. FINDINGS: Pedicle screws have been placed at L4, L5, and S1. Disc spacer devices are seen at both levels as well in good position. Posterior retractor is identified. IMPRESSION: Surgical changes as above. Electronically Signed   By: Dorise Bullion III M.D   On: 03/02/2019 11:47     Treatments: surgery: Bilateral L5-S1 laminotomy/foraminotomies/medial facetectomy to decompress the bilateral L5 and S1 nerve roots(the work required to do this was in addition to the work required to do the  posterior lumbar interbody fusion because of the patient's spinal stenosis, facet arthropathy. Etc. requiring a wide decompression of the nerve roots.);  L5-S1 transforaminal lumbar interbody fusion with local morselized autograft bone and Zimmer DBM; insertion of interbody prosthesis at L5-S1 (globus peek expandable interbody prosthesis); posterior segmental instrumentation from L4 to S1 with globus titanium pedicle screws and rods; posterior lateral arthrodesis at L5-S1 and redo L4-5 posterior lateral arthrodesis with local morselized autograft bone and Zimmer DBM; exploration of lumbar fusion/removal of lumbar hardware  Discharge Exam: Blood pressure 107/63, pulse 75, temperature 97.9 F (36.6 C), temperature source Oral, resp. rate 17, height 5' 2.5" (1.588 m), weight 124.3 kg, last menstrual period 11/08/2006, SpO2 90 %.   Alert and oriented x 4 PERRLA CN II-XII grossly intact MAE Strength 5/5 BUE, BLE Incision clean, dry, intact, and well-approximated; Steri Strips in place; Ecchymosis surrounding lower portion of her incision, spanning across her lower back  Disposition: Home   Allergies as of 03/06/2019      Reactions   Codeine Nausea And Vomiting   Atorvastatin    She cannot remember reaction   Methotrexate Derivatives Nausea And Vomiting   Over sedation   Quinapril Hcl Cough      Medication List    STOP taking these medications   naproxen sodium 220 MG tablet Commonly known as: ALEVE     TAKE these medications   Accu-Chek FastClix Lancets Misc   APPLE CIDER VINEGAR PO Take 400 mg by mouth 3 (three) times daily.   aspirin-acetaminophen-caffeine 250-250-65 MG tablet Commonly known as: EXCEDRIN MIGRAINE Take 3 tablets by mouth daily as needed for headache.   BD Insulin Syringe U/F 31G X 5/16"  0.5 ML Misc Generic drug: Insulin Syringe-Needle U-100 USE TO INJECT UP TO THREE TIMES A DAY   buPROPion 150 MG 24 hr tablet Commonly known as: WELLBUTRIN XL Take 150 mg by  mouth daily.   CALCIUM 1000 + D PO Take 1 tablet by mouth daily.   calcium carbonate 500 MG chewable tablet Commonly known as: TUMS - dosed in mg elemental calcium Chew 3 tablets by mouth daily as needed for indigestion or heartburn.   cetirizine 10 MG tablet Commonly known as: ZYRTEC Take 10 mg by mouth.   cyclobenzaprine 10 MG tablet Commonly known as: FLEXERIL Take 1 tablet (10 mg total) by mouth 3 (three) times daily as needed for muscle spasms.   diclofenac sodium 1 % Gel Commonly known as: VOLTAREN Apply 1 application topically daily as needed (knee / hand pain).   dimenhyDRINATE 50 MG tablet Commonly known as: DRAMAMINE Take 50 mg by mouth daily as needed for dizziness.   diphenhydramine-acetaminophen 25-500 MG Tabs tablet Commonly known as: TYLENOL PM Take 3 tablets by mouth at bedtime as needed (pain/sleep).   docusate sodium 100 MG capsule Commonly known as: COLACE Take 1 capsule (100 mg total) by mouth 2 (two) times daily.   empagliflozin 25 MG Tabs tablet Commonly known as: JARDIANCE Take 25 mg by mouth daily.   folic acid 1 MG tablet Commonly known as: FOLVITE TAKE 2 TABLETS(2 MG) BY MOUTH DAILY What changed: See the new instructions.   FREESTYLE LITE test strip Generic drug: glucose blood daily.   furosemide 40 MG tablet Commonly known as: LASIX Take 40 mg by mouth daily.   gabapentin 300 MG capsule Commonly known as: NEURONTIN Take 1 capsule (300 mg total) by mouth 3 (three) times daily.   Horizant 600 MG Tbcr Generic drug: Gabapentin Enacarbil Take 1 tablet (600 mg total) by mouth 2 (two) times daily. What changed:   how much to take  when to take this   HUMULIN R U-500 (CONCENTRATED) [place] Inject 0.05-0.25 mLs into the skin 3 (three) times daily as needed (for high blood sugar).   HYDROcodone-acetaminophen 10-325 MG tablet Commonly known as: NORCO Take 1 tablet by mouth every 4 (four) hours as needed for moderate pain.    hydroxychloroquine 200 MG tablet Commonly known as: PLAQUENIL Take 1 tablet (200 mg total) by mouth 2 (two) times daily.   lidocaine 5 % ointment Commonly known as: XYLOCAINE Apply 1 application topically as needed. What changed:   when to take this  reasons to take this   LORazepam 1 MG tablet Commonly known as: ATIVAN Take 3 mg by mouth at bedtime.   losartan 50 MG tablet Commonly known as: COZAAR Take 50 mg by mouth daily after supper.   Ozempic (0.25 or 0.5 MG/DOSE) 2 MG/1.5ML Sopn Generic drug: Semaglutide(0.25 or 0.5MG /DOS) Inject 0.5 mg into the skin every Thursday.   PreviDent 5000 Plus 1.1 % Crea dental cream Generic drug: sodium fluoride Place 1 application onto teeth 3 (three) times a week.   promethazine 12.5 MG tablet Commonly known as: PHENERGAN Take 1 tablet (12.5 mg total) by mouth every 6 (six) hours as needed for nausea or vomiting.   SYSTANE OP Place 1 drop into both eyes at bedtime as needed (dry eyes).   thyroid 240 MG tablet Commonly known as: ARMOUR Take 240 mg by mouth daily.      Follow-up Information    Care, [facility] Follow up.   Specialty: Home Health Services Why: Home Health  Services Arranged Contact information: [place] [address] [phone]        Newman Pies, MD. Schedule an appointment as soon as possible for a visit in 2 week(s).   Specialty: Neurosurgery Contact information: [address] [address] [place] [address] [phone]           Signed: Patricia Nettle 03/06/2019, 11:41 AM

## 2019-03-06 NOTE — Progress Notes (Signed)
All lines removed, pt left floor via WC, assisted into SUV.   Arlis Porta

## 2019-03-06 NOTE — TOC Transition Note (Signed)
Transition of Care [facility]) - CM/SW Discharge Note   Patient Details  Name: Gloria Porter MRN: [id number] Date of Birth: Jun 11, 1956  [facility] CM/SW Contact:  Bartholomew Crews, RN Phone Number: [phone] 03/06/2019, 12:20 PM   Clinical Narrative:    Patient to transition home today. HH PT and OT orders provided by MD. Noted arrangements aleady made by CSW for [facility] to provide [facility]. Notified [facility] of readiness for home. No further CM needs identified at this time.    Final next level of care: [place] Barriers to Discharge: No Barriers Identified   Patient Goals and CMS Choice Patient states their goals for this hospitalization and ongoing recovery are:: Get stronger at home CMS Medicare.gov Compare Post Acute Care list provided to:: Patient Choice offered to / list presented to : Patient  Discharge Placement                       Discharge Plan and Services In-house Referral: NA Discharge Planning Services: CM Consult Post Acute Care Choice: Home Health          DME Arranged: N/A(RN arranging)         HH Arranged: PT, OT [place] Agency: [place] Date [place]: 03/06/19 Time [place]: 1220 Representative spoke with at [place]: [place] ([place]) Interventions     Readmission Risk Interventions No flowsheet data found.

## 2019-03-06 NOTE — Progress Notes (Signed)
Occupational Therapy Treatment Patient Details Name: Gloria Porter MRN: [id number] DOB: [birth year] Today's Date: 03/06/2019    History of present illness Pt is a 63 y/o female who presents s/p L4-S1 on 03/02/2019. PMH significant for Sjogren's disease, neuropathy, hypothyroidism, HTN, fibromyalgia, DM, Cushing's disease s/p pituitary tumor removal in 2012, prior lumbar fusion.    OT comments  Pt currently min guard assist for functional transfers to the toilet as well as tub shower with use of the RW and appropriate DME.  She still reports increased pain in her LEs with mobility with pain increasing from a 3/10 up to an 8/10.  Pain is decreased with rest.  Recommend HHOT at discharge to continue progression to modified independent level.     Follow Up Recommendations  Supervision/Assistance - 24 hour;Home health OT    Equipment Recommendations  None recommended by OT;Other (comment)(pt will purchase on her own)       Precautions / Restrictions Precautions Precautions: Fall;Back Required Braces or Orthoses: Spinal Brace Spinal Brace: Lumbar corset;Applied in sitting position Restrictions Weight Bearing Restrictions: No       Mobility Bed Mobility Overal bed mobility: Needs Assistance Bed Mobility: Supine to Sit Rolling: Supervision Sidelying to sit: Supervision       General bed mobility comments: Pt able to transition to sitting from sidelying to sitting with 2 attempts but no physical assistance.  Transfers Overall transfer level: Needs assistance Equipment used: Rolling walker (2 wheeled) Transfers: Sit to/from [facility] Sit to Stand: Min guard Stand pivot transfers: Min guard            Balance Overall balance assessment: Needs assistance Sitting-balance support: Feet supported Sitting balance-Leahy Scale: Good       Standing balance-Leahy Scale: Poor Standing balance comment: Patient needs BUE support for balance secondary to weakness  and increased back pain.                           ADL either performed or assessed with clinical judgement   ADL Overall ADL's : Needs assistance/impaired                 Upper Body Dressing : Supervision/safety;Sitting Upper Body Dressing Details (indicate cue type and reason): including lumbar corsett Lower Body Dressing: Minimal assistance;Sit to/from stand;Adhering to back precautions   Toilet Transfer: Min guard;Ambulation;Regular Toilet;Grab bars   Toileting- Clothing Manipulation and Hygiene: Maximal assistance Toileting - Clothing Manipulation Details (indicate cue type and reason): Pt needed max assist for toilet hygiene secondary to not having AE here that she uses at home. Tub/ Shower Transfer: Tub transfer;Min guard;Tub bench;Rolling walker   Functional mobility during ADLs: Min guard General ADL Comments: Pt still with increased pain with mobility and standing, increasing from 3/10 in her back and LEs to 8/10 with toilet transfer.  Educated pt on use of the tub bench as well as the sockaide, which she will pursuit through [employer] or outside vendor.  She reports that her spouse can provide 24 hour assist at discharge.  Recommend HHOT eval for progress of ADL independence at discharge.               Cognition Arousal/Alertness: Awake/alert Behavior During Therapy: WFL for tasks assessed/performed Overall Cognitive Status: Within Functional Limits for tasks assessed  Pertinent Vitals/ Pain       Pain Assessment: 0-10 Pain Score: 8  Pain Location: Back, bilateral hips/legs Pain Descriptors / Indicators: Burning;Discomfort Pain Intervention(s): Limited activity within patient's tolerance;Monitored during session;Repositioned         Frequency  Min 2X/week        Progress Toward Goals  OT Goals(current goals can now be found in the care plan section)  Progress towards  OT goals: Progressing toward goals     Plan Discharge plan remains appropriate       AM-PAC OT "6 Clicks" Daily Activity     Outcome Measure   Help from another person eating meals?: None Help from another person taking care of personal grooming?: A Little Help from another person toileting, which includes using toliet, bedpan, or urinal?: A Little   Help from another person to put on and taking off regular upper body clothing?: None Help from another person to put on and taking off regular lower body clothing?: A Little 6 Click Score: 17    End of Session Equipment Utilized During Treatment: Rolling walker;Back brace  OT Visit Diagnosis: Unsteadiness on feet (R26.81);Muscle weakness (generalized) (M62.81);Pain Pain - part of body: (back)   Activity Tolerance Patient limited by pain   Patient Left Other (comment)(With PT for next session in the therapy gym.)   Nurse Communication Mobility status        Time: 1335-1405 OT Time Calculation (min): 30 min  Charges: OT General Charges $OT Visit: 1 Visit OT Treatments $Self Care/Home Management : 23-37 mins    Kazuki Ingle OTR/L 03/06/2019, 2:21 PM

## 2019-03-27 ENCOUNTER — Other Ambulatory Visit: Payer: Self-pay | Admitting: Rheumatology

## 2019-03-27 DIAGNOSIS — M351 Other overlap syndromes: Secondary | ICD-10-CM

## 2019-03-27 NOTE — Telephone Encounter (Signed)
Last Visit: 01/22/19 Next Visit: 05/21/19 Labs: 01/07/19 Glucose is elevated-148. Rest of CMP WNL. RDW is stable. Rest of CBC is WNL PLQ eye exam: 02/09/19 Normal.  Okay to refill per Dr. Estanislado Pandy

## 2019-04-07 ENCOUNTER — Encounter (HOSPITAL_COMMUNITY): Payer: Self-pay | Admitting: Neurosurgery

## 2019-04-17 ENCOUNTER — Emergency Department (HOSPITAL_COMMUNITY)
Admission: EM | Admit: 2019-04-17 | Discharge: 2019-04-17 | Disposition: A | Discharge status: Home / Self Care | Attending: Emergency Medicine | Admitting: Emergency Medicine

## 2019-04-17 ENCOUNTER — Encounter (HOSPITAL_COMMUNITY): Payer: Self-pay

## 2019-04-17 ENCOUNTER — Emergency Department (HOSPITAL_COMMUNITY)

## 2019-04-17 ENCOUNTER — Other Ambulatory Visit: Payer: Self-pay

## 2019-04-17 DIAGNOSIS — R0789 Other chest pain: Secondary | ICD-10-CM | POA: Diagnosis not present

## 2019-04-17 DIAGNOSIS — Z79899 Other long term (current) drug therapy: Secondary | ICD-10-CM | POA: Diagnosis not present

## 2019-04-17 DIAGNOSIS — Z87891 Personal history of nicotine dependence: Secondary | ICD-10-CM | POA: Diagnosis not present

## 2019-04-17 DIAGNOSIS — E119 Type 2 diabetes mellitus without complications: Secondary | ICD-10-CM | POA: Diagnosis not present

## 2019-04-17 DIAGNOSIS — R079 Chest pain, unspecified: Secondary | ICD-10-CM | POA: Diagnosis present

## 2019-04-17 DIAGNOSIS — Z7984 Long term (current) use of oral hypoglycemic drugs: Secondary | ICD-10-CM | POA: Diagnosis not present

## 2019-04-17 DIAGNOSIS — E039 Hypothyroidism, unspecified: Secondary | ICD-10-CM | POA: Diagnosis not present

## 2019-04-17 DIAGNOSIS — I10 Essential (primary) hypertension: Secondary | ICD-10-CM | POA: Insufficient documentation

## 2019-04-17 LAB — CBC WITH DIFFERENTIAL/PLATELET
Abs Immature Granulocytes: 0.06 10*3/uL (ref 0.00–0.07)
Basophils Absolute: 0.1 10*3/uL (ref 0.0–0.1)
Basophils Relative: 1 %
Eosinophils Absolute: 0.2 10*3/uL (ref 0.0–0.5)
Eosinophils Relative: 3 %
HCT: 40.9 % (ref 36.0–46.0)
Hemoglobin: 12 g/dL (ref 12.0–15.0)
Immature Granulocytes: 1 %
Lymphocytes Relative: 19 %
Lymphs Abs: 1.4 10*3/uL (ref 0.7–4.0)
MCH: 27.5 pg (ref 26.0–34.0)
MCHC: 29.3 g/dL — ABNORMAL LOW (ref 30.0–36.0)
MCV: 93.6 fL (ref 80.0–100.0)
Monocytes Absolute: 0.7 10*3/uL (ref 0.1–1.0)
Monocytes Relative: 10 %
Neutro Abs: 4.9 10*3/uL (ref 1.7–7.7)
Neutrophils Relative %: 66 %
Platelets: 210 10*3/uL (ref 150–400)
RBC: 4.37 MIL/uL (ref 3.87–5.11)
RDW: 15.4 % (ref 11.5–15.5)
WBC: 7.3 10*3/uL (ref 4.0–10.5)
nRBC: 0 % (ref 0.0–0.2)

## 2019-04-17 LAB — BASIC METABOLIC PANEL
Anion gap: 8 (ref 5–15)
BUN: 11 mg/dL (ref 8–23)
CO2: 27 mmol/L (ref 22–32)
Calcium: 9 mg/dL (ref 8.9–10.3)
Chloride: 105 mmol/L (ref 98–111)
Creatinine, Ser: 0.61 mg/dL (ref 0.44–1.00)
GFR calc Af Amer: >60 mL/min (ref >60)
GFR calc non Af Amer: >60 mL/min (ref >60)
Glucose, Bld: 91 mg/dL (ref 70–99)
Potassium: 3.8 mmol/L (ref 3.5–5.1)
Sodium: 140 mmol/L (ref 135–145)

## 2019-04-17 LAB — TROPONIN I (HIGH SENSITIVITY)
Troponin I (High Sensitivity): 5 ng/L (ref <18)
Troponin I (High Sensitivity): 4 ng/L (ref <18)

## 2019-04-17 MED ORDER — LIDOCAINE VISCOUS HCL 2 % MT SOLN
15.0000 mL | Freq: Once | OROMUCOSAL | Status: AC
  Administered 2019-04-17: 03:00:00 15 mL via ORAL
  Filled 2019-04-17: qty 15

## 2019-04-17 MED ORDER — ALUM & MAG HYDROXIDE-SIMETH 200-200-20 MG/5ML PO SUSP
30.0000 mL | Freq: Once | ORAL | Status: AC
  Administered 2019-04-17: 30 mL via ORAL
  Filled 2019-04-17: qty 30

## 2019-04-17 MED ORDER — PANTOPRAZOLE SODIUM 40 MG PO TBEC: 40.0000 mg | DELAYED_RELEASE_TABLET | Freq: Every day | ORAL | 0 refills | Status: DC

## 2019-04-17 MED ORDER — PANTOPRAZOLE SODIUM 40 MG PO TBEC
40.0000 mg | DELAYED_RELEASE_TABLET | Freq: Once | ORAL | Status: AC
  Administered 2019-04-17: 40 mg via ORAL
  Filled 2019-04-17: qty 1

## 2019-04-17 NOTE — ED Triage Notes (Signed)
Pt arrived via [facility]; pt from hm with c/o CP that started at 11p; woke her up out of her sleep; pn was centralized and radiated to the bk, currently under L breast; pn currently 3/10; pt rec'd 324 ASA; 1 nitro; 4mg  of Morph; 4mg  of zofran; 111/85; 93; 115 CBG; 97.5

## 2019-04-17 NOTE — ED Provider Notes (Signed)
[facility] EMERGENCY DEPARTMENT Provider Note   CSN: [id number] Arrival date & time: 04/17/19  0130    History   Chief Complaint Chief Complaint  Patient presents with   Chest Pain    HPI Gloria Porter is a 63 y.o. female.   The history is provided by the patient.  She has an extensive past history that includes hypertension, diabetes, GERD, mixed connective tissue disease, Cushing's disease, MGUS and comes in because of an episode of chest pain.  She was awakened by a dull pain across her lower chest with some radiation to the back.  Pain was severe and she rated it at 10/10.  She has had problems with acid reflux waking her up and she tried sitting up, but this did not help.  She was also tried taking an antiacid without any relief.  There was mild associated dyspnea but no nausea or diaphoresis.  She called for an ambulance who gave her aspirin, morphine, nitroglycerin with significant improvement in pain and she now rates it at 2/10.  She did develop nausea after getting morphine and was given ondansetron.  She is a former smoker having quit in 2007.  She denies history of hyperlipidemia and there is no family history of premature coronary atherosclerosis.  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 Iran Planas   Difficult intubation    2011 scratched trachea   Dyspnea    upon exertion   Fibromyalgia    GERD (gastroesophageal reflux disease)    Headache(784.0)    Hypertension    Hypothyroidism    Lumbar disc disease    MGUS (monoclonal gammopathy of unknown significance)    Mixed connective tissue disease ([place])    Neuropathy    Pneumonia    Sjogren's disease ([place])    Sleep apnea    no cpap.sleep study 2006 [place]    Patient Active Problem List   Diagnosis Date Noted   Pseudoarthrosis of lumbar  spine 03/02/2019   Primary osteoarthritis of both hands 04/10/2017   Primary osteoarthritis of both knees 04/10/2017   DDD (degenerative disc disease), lumbar 04/10/2017   Mixed connective tissue disease ([place]) 04/10/2017   S/P trigger finger release bilateral third 03/14/2017   MGUS (monoclonal gammopathy of unknown significance) 03/11/2017   History of chronic kidney disease 03/11/2017   Monoclonal paraproteinemia 01/11/2016   Restless leg 01/11/2015   Carpal tunnel syndrome 12/28/2014   Dysphagia, oropharyngeal 12/28/2014   Anxiety, generalized 10/14/2014   Generalized joint pain 08/17/2014   Encounter for therapeutic drug monitoring 07/13/2014   Failed back syndrome of lumbar spine 07/13/2014   Encounter for therapeutic drug level monitoring 07/13/2014   ACTH excess, central ([place]) 06/16/2014   Diabetes ([place]) 06/16/2014   Fibrositis 06/16/2014   Cannot sleep 06/16/2014   Extreme obesity 06/16/2014   Neuropathy 06/16/2014   Allergic rhinitis, seasonal 06/16/2014   Post menopausal syndrome 06/16/2014   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   Spinal stenosis 06/16/2014   Adult hypothyroidism 06/16/2014   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[place]) 06/16/2014   Diabetes mellitus ([place]) 06/16/2014   Fibromyalgia 06/16/2014   Morbid obesity ([place]) 06/16/2014   Sjogren's syndrome ([place]) 06/16/2014   Postmenopausal HRT (hormone replacement therapy) 06/16/2014   Breath shortness 01/21/2014   Essential (primary) hypertension 01/21/2014   Acid reflux 01/21/2014   Adiposity 01/21/2014  Clinical depression 08/26/2013   Obstructive apnea 01/17/2013   Cushing's syndrome ([place]) 05/23/2011   Pituitary adenoma ([place]) 05/23/2011    Past Surgical History:  Procedure Laterality Date   APPENDECTOMY     BACK SURGERY     lumbar fusion   BRAIN SURGERY     For cushings   CARPAL TUNNEL RELEASE Bilateral    CESAREAN SECTION     COLONOSCOPY     COMBINED  HYSTEROSCOPY DIAGNOSTIC / D&C     ENDOMETRIAL ABLATION     ESOPHAGOGASTRODUODENOSCOPY ENDOSCOPY     FRACTURE SURGERY     l arm   LUMBAR LAMINECTOMY/DECOMPRESSION MICRODISCECTOMY  11/13/2011   Procedure: LUMBAR LAMINECTOMY/DECOMPRESSION MICRODISCECTOMY;  Surgeon: Faythe Ghee, MD;  Location: [facility];  Service: Neurosurgery;  Laterality: Bilateral;  Bilateral Lumbar four-five Decompression   TONSILLECTOMY     TRANSPHENOIDAL / TRANSNASAL HYPOPHYSECTOMY / RESECTION PITUITARY TUMOR     TRIGGER FINGER RELEASE     TUBAL LIGATION       OB History   No obstetric history on file.      Home Medications    Prior to Admission medications   Medication Sig Start Date End Date Taking? Authorizing Provider  ACCU-CHEK FASTCLIX LANCETS [place]  03/23/14   [provider]  APPLE CIDER VINEGAR PO Take 400 mg by mouth 3 (three) times daily.     [provider]  aspirin-acetaminophen-caffeine (EXCEDRIN MIGRAINE) 702 186 1828 MG tablet Take 3 tablets by mouth daily as needed for headache.    [provider]  buPROPion (WELLBUTRIN XL) 150 MG 24 hr tablet Take 150 mg by mouth daily.    [provider]  Calcium Carb-Cholecalciferol (CALCIUM 1000 + D PO) Take 1 tablet by mouth daily.     [provider]  calcium carbonate (TUMS - DOSED IN MG ELEMENTAL CALCIUM) 500 MG chewable tablet Chew 3 tablets by mouth daily as needed for indigestion or heartburn.    [provider]  cetirizine (ZYRTEC) 10 MG tablet Take 10 mg by mouth.    [provider]  cyclobenzaprine (FLEXERIL) 10 MG tablet Take 1 tablet (10 mg total) by mouth 3 (three) times daily as needed for muscle spasms. 03/06/19   Viona Gilmore D, NP  diclofenac sodium (VOLTAREN) 1 % GEL Apply 1 application topically daily as needed (knee / hand pain).  06/06/17   [provider]  dimenhyDRINATE (DRAMAMINE) 50 MG tablet Take 50 mg by mouth daily as needed for dizziness.    [provider]  diphenhydramine-acetaminophen (TYLENOL PM) 25-500 MG TABS tablet Take 3 tablets by mouth at bedtime as needed (pain/sleep).    [provider]  docusate sodium (COLACE) 100 MG capsule Take 1 capsule (100 mg total) by mouth 2 (two) times daily. 03/06/19   Viona Gilmore D, NP  empagliflozin (JARDIANCE) 25 MG TABS tablet Take 25 mg by mouth daily.  02/19/18   [provider]  folic acid (FOLVITE) 1 MG tablet TAKE 2 TABLETS(2 MG) BY MOUTH DAILY Patient taking differently: Take 1 mg by mouth daily.  09/26/18   Bo Merino, MD  FREESTYLE LITE test strip daily. 08/22/17   [provider]  furosemide (LASIX) 40 MG tablet Take 40 mg by mouth daily.    [provider]  gabapentin (NEURONTIN) 300 MG capsule Take 1 capsule (300 mg total) by mouth 3 (three) times daily. 03/06/19   Bergman, Trinda Pascal D, NP  HORIZANT 600 MG TBCR Take 1 tablet (600 mg total) by  mouth 2 (two) times daily. Patient taking differently: Take 1,200 mg by mouth daily with supper.  09/11/18   Sater, Nanine Means, MD  HYDROcodone-acetaminophen (NORCO) 10-325 MG tablet Take 1 tablet by mouth every 4 (four) hours as needed for moderate pain. 03/06/19   Viona Gilmore D, NP  hydroxychloroquine (PLAQUENIL) 200 MG tablet TAKE 1 TABLET TWICE A DAY 03/27/19   Bo Merino, MD  Insulin Regular Human (HUMULIN R U-500, CONCENTRATED, [place]) Inject 0.05-0.25 mLs into the skin 3 (three) times daily as needed (for high blood sugar).     [provider]  Insulin Syringe-Needle U-100 (BD INSULIN SYRINGE U/F) 31G X 5/16" 0.5 ML MISC USE TO INJECT UP TO THREE TIMES A DAY 07/28/18   [provider]  lidocaine (XYLOCAINE) 5 % ointment Apply 1 application topically as needed. Patient taking differently: Apply 1 application topically 2 (two) times daily as needed (toe pain).  09/11/18   Sater, Nanine Means, MD  LORazepam (ATIVAN) 1 MG tablet Take 3 mg by mouth at bedtime.  08/25/17   [provider]  losartan (COZAAR) 50 MG tablet Take 50 mg by mouth daily after supper.  12/23/14 02/17/19  [provider]  Polyethyl Glycol-Propyl Glycol (SYSTANE OP) Place 1 drop into both eyes at bedtime as needed (dry eyes).    [provider]  PREVIDENT 5000 PLUS 1.1 % CREA dental cream Place 1 application onto teeth 3 (three) times a week.  05/09/18   [provider]  promethazine (PHENERGAN) 12.5 MG tablet Take 1 tablet (12.5 mg total) by mouth every 6 (six) hours as needed for nausea or vomiting. 03/06/19   Viona Gilmore D, NP  Semaglutide (OZEMPIC) 0.25 or 0.5 MG/DOSE SOPN Inject 0.5 mg into the skin every Thursday.     [provider]  thyroid (ARMOUR) 240 MG tablet Take 240 mg by mouth daily.     [provider]    Family History Family History  Problem Relation Age of Onset   Diabetes type II Mother    Diabetes type II Father    Heart disease Father    Heart attack Father    Colon cancer Maternal Grandmother    Heart disease Maternal Grandfather     Social History Social History   Tobacco Use   Smoking status: Former Smoker    Packs/day: 1.00    Years: 32.00    Pack years: 32.00    Types: Cigarettes    Quit date: 07/27/2006    Years since quitting: 12.7   Smokeless tobacco: Never Used  Substance Use Topics   Alcohol use: Yes    Comment: rarely   Drug use: No     Allergies   Codeine, Atorvastatin, Methotrexate, Methotrexate derivatives, and Quinapril hcl   Review of Systems Review of Systems  All other systems reviewed and are negative.    Physical Exam Updated Vital Signs BP (!) 111/43    Pulse 71    Resp 14    LMP 11/08/2006    SpO2 92%   Physical Exam Vitals signs and nursing note reviewed.    63 year old female, resting comfortably and in no acute distress. Vital signs are normal. Oxygen saturation is 92%, which is normal. Head is normocephalic and atraumatic. PERRLA, EOMI. Oropharynx is  clear. Neck is nontender and supple without adenopathy or JVD. Back is nontender and there is no CVA tenderness. Lungs are clear without rales, wheezes, or rhonchi. Chest is nontender. Heart has regular rate  and rhythm without murmur. Abdomen is soft, flat, with mild to moderate epigastric tenderness.  There is no rebound or guarding.  There are no masses or hepatosplenomegaly and peristalsis is normoactive. Extremities have trace edema, full range of motion is present. Skin is warm and dry without rash. Neurologic: Mental status is normal, cranial nerves are intact, there are no motor or sensory deficits.  ED Treatments / Results  Labs (all labs ordered are listed, but only abnormal results are displayed) Labs Reviewed  CBC WITH DIFFERENTIAL/PLATELET - Abnormal; Notable for the following components:      Result Value   MCHC 29.3 (*)    All other components within normal limits  BASIC METABOLIC PANEL  TROPONIN I (HIGH SENSITIVITY)  TROPONIN I (HIGH SENSITIVITY)    EKG EKG Interpretation  Date/Time:  Friday April 17 2019 03:56:38 EDT Ventricular Rate:  79 PR Interval:    QRS Duration: 116 QT Interval:  423 QTC Calculation: 485 R Axis:   -41 Text Interpretation:  Age not entered, assumed to be  63 years old for purpose of ECG interpretation Sinus rhythm Nonspecific IVCD with LAD Anterior infarct, old When compared with ECG of 02/25/2019, No significant change was found Confirmed by Delora Fuel ([id number]) on 04/17/2019 4:01:30 AM   Radiology Dg Chest Port 1 View  Result Date: 04/17/2019 CLINICAL DATA:  Chest pain. EXAM: PORTABLE CHEST 1 VIEW COMPARISON:  11/28/2016 FINDINGS: Low lung volumes.The cardiomediastinal contours are normal. Streaky subsegmental atelectasis at the left lung base. Pulmonary vasculature is normal. No consolidation, pleural effusion, or pneumothorax. No acute osseous abnormalities are seen. IMPRESSION: Low lung volumes with left basilar atelectasis.  Electronically Signed   By: Keith Rake M.D.   On: 04/17/2019 03:00    Procedures Procedures  Medications Ordered in ED Medications  pantoprazole (PROTONIX) EC tablet 40 mg (has no administration in time range)  alum & mag hydroxide-simeth (MAALOX/MYLANTA) 200-200-20 MG/5ML suspension 30 mL (30 mLs Oral Given 04/17/19 0241)    And  lidocaine (XYLOCAINE) 2 % viscous mouth solution 15 mL (15 mLs Oral Given 04/17/19 0241)     Initial Impression / Assessment and Plan / ED Course  I have reviewed the triage vital signs and the nursing notes.  Pertinent labs & imaging results that were available during my care of the patient were reviewed by me and considered in my medical decision making (see chart for details).  Chest pain which most likely is related to GERD, but need to rule out cardiac cause.  Will check ECG, chest x-ray, high-sensitivity troponin.  Old records are reviewed, and she has no relevant past visits.  Will give a therapeutic trial of a GI cocktail.  ECG shows no acute changes.  Chest x-ray is unremarkable.  Labs are normal including normal troponin.  She will be held for delta troponin.  She states that her pain is already gone away when she had the GI cocktail, so she was unable to assess whether it was helping.  Heart score is 3 which puts her at low risk for major adverse cardiac events in the next 6 weeks.  Repeat troponin is unchanged.  She is discharged with prescription for pantoprazole, follow-up with PCP and with her gastroenterologist.  Final Clinical Impressions(s) / ED Diagnoses   Final diagnoses:  Atypical chest pain    ED Discharge Orders         Ordered    pantoprazole (PROTONIX) 40 MG tablet  Daily     04/17/19 0522  Delora Fuel, MD [id number]

## 2019-04-17 NOTE — Discharge Instructions (Addendum)
Return if you are having any problems. 

## 2019-05-07 NOTE — Progress Notes (Deleted)
Office Visit Note  Patient: Gloria Porter             Date of Birth: 02/17/1956           MRN: [id number]             PCP: Florestine Avers (Inactive) Referring: No ref. provider found Visit Date: 05/21/2019 Occupation: @GUAROCC @  Subjective:  No chief complaint on file.  Plaquenil 200 mg 1 tablet twice daily. Last Plaquenil eye exam normal on 02/09/2019. Most recent CBC/CMP within normal limits on 04/17/2019.  History of Present Illness: Gloria Porter is a 63 y.o. female ***   Activities of Daily Living:  Patient reports morning stiffness for *** {minute/hour:19697}.   Patient {ACTIONS;DENIES/REPORTS:21021675::"Denies"} nocturnal pain.  Difficulty dressing/grooming: {ACTIONS;DENIES/REPORTS:21021675::"Denies"} Difficulty climbing stairs: {ACTIONS;DENIES/REPORTS:21021675::"Denies"} Difficulty getting out of chair: {ACTIONS;DENIES/REPORTS:21021675::"Denies"} Difficulty using hands for taps, buttons, cutlery, and/or writing: {ACTIONS;DENIES/REPORTS:21021675::"Denies"}  No Rheumatology ROS completed.   PMFS History:  Patient Active Problem List   Diagnosis Date Noted  . Pseudoarthrosis of lumbar spine 03/02/2019  . Primary osteoarthritis of both hands 04/10/2017  . Primary osteoarthritis of both knees 04/10/2017  . DDD (degenerative disc disease), lumbar 04/10/2017  . Mixed connective tissue disease ([place]) 04/10/2017  . S/P trigger finger release bilateral third 03/14/2017  . MGUS (monoclonal gammopathy of unknown significance) 03/11/2017  . History of chronic kidney disease 03/11/2017  . Monoclonal paraproteinemia 01/11/2016  . Restless leg 01/11/2015  . Carpal tunnel syndrome 12/28/2014  . Dysphagia, oropharyngeal 12/28/2014  . Anxiety, generalized 10/14/2014  . Generalized joint pain 08/17/2014  . Encounter for therapeutic drug monitoring 07/13/2014  . Failed back syndrome of lumbar spine 07/13/2014  . Encounter for therapeutic drug level monitoring 07/13/2014  .  ACTH excess, central ([place]) 06/16/2014  . Diabetes ([place]) 06/16/2014  . Fibrositis 06/16/2014  . Cannot sleep 06/16/2014  . Extreme obesity 06/16/2014  . Neuropathy 06/16/2014  . Allergic rhinitis, seasonal 06/16/2014  . Post menopausal syndrome 06/16/2014  . 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 Spinal stenosis 06/16/2014  . Adult hypothyroidism 06/16/2014  . 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 Diabetes mellitus ([place]) 06/16/2014  . Fibromyalgia 06/16/2014  . Morbid obesity ([place]) 06/16/2014  . Sjogren's syndrome ([place]) 06/16/2014  . Postmenopausal HRT (hormone replacement therapy) 06/16/2014  . Breath shortness 01/21/2014  . Essential (primary) hypertension 01/21/2014  . Acid reflux 01/21/2014  . Adiposity 01/21/2014  . Clinical depression 08/26/2013  . Obstructive apnea 01/17/2013  . Cushing's syndrome ([place]) 05/23/2011  . Pituitary adenoma ([place]) 05/23/2011    Past Medical History:  Diagnosis Date  . Anxiety   . Arthritis   . Cushing's disease ([place])    pituitary tumor removed 2012  . Diabetes mellitus    Endocrinologist Dr. Iran Planas  . Difficult intubation    2011 scratched trachea  . Dyspnea    upon exertion  . Fibromyalgia   . GERD (gastroesophageal reflux disease)   . Headache(784.0)   . Hypertension   . Hypothyroidism   . Lumbar disc disease   . MGUS (monoclonal gammopathy of unknown significance)   . Mixed connective tissue disease ([place])   . Neuropathy   . Pneumonia   . Sjogren's disease ([place])   . Sleep apnea    no cpap.sleep study 2006 [place]    Family History  Problem Relation Age of Onset  . Diabetes type II Mother   . Diabetes type II Father   . Heart disease Father   . Heart attack Father   .  Colon cancer Maternal Grandmother   . Heart disease Maternal Grandfather    Past Surgical History:  Procedure Laterality Date  . APPENDECTOMY    . BACK SURGERY     lumbar fusion  . BRAIN SURGERY     For cushings  . CARPAL  TUNNEL RELEASE Bilateral   . CESAREAN SECTION    . COLONOSCOPY    . COMBINED HYSTEROSCOPY DIAGNOSTIC / D&C    . ENDOMETRIAL ABLATION    . ESOPHAGOGASTRODUODENOSCOPY ENDOSCOPY    . FRACTURE SURGERY     l arm  . LUMBAR LAMINECTOMY/DECOMPRESSION MICRODISCECTOMY  11/13/2011   Procedure: LUMBAR LAMINECTOMY/DECOMPRESSION MICRODISCECTOMY;  Surgeon: Faythe Ghee, MD;  Location: [facility];  Service: Neurosurgery;  Laterality: Bilateral;  Bilateral Lumbar four-five Decompression  . TONSILLECTOMY    . TRANSPHENOIDAL / TRANSNASAL HYPOPHYSECTOMY / RESECTION PITUITARY TUMOR    . TRIGGER FINGER RELEASE    . TUBAL LIGATION     Social History   Social History Narrative  . Not on file   Immunization History  Administered Date(s) Administered  . Influenza Inj Mdck Quad Pf 07/12/2017, 07/14/2018  . Influenza Split 06/13/2017  . Pneumococcal Conjugate-13 12/28/2014  . Pneumococcal Polysaccharide-23 01/03/2017     Objective: Vital Signs: LMP 11/08/2006    Physical Exam   Musculoskeletal Exam: ***  CDAI Exam: CDAI Score: - Patient Global: -; Provider Global: - Swollen: -; Tender: - Joint Exam   No joint exam has been documented for this visit   There is currently no information documented on the homunculus. Go to the Rheumatology activity and complete the homunculus joint exam.  Investigation: No additional findings.  Imaging: Dg Chest Port 1 View  Result Date: 04/17/2019 CLINICAL DATA:  Chest pain. EXAM: PORTABLE CHEST 1 VIEW COMPARISON:  11/28/2016 FINDINGS: Low lung volumes.The cardiomediastinal contours are normal. Streaky subsegmental atelectasis at the left lung base. Pulmonary vasculature is normal. No consolidation, pleural effusion, or pneumothorax. No acute osseous abnormalities are seen. IMPRESSION: Low lung volumes with left basilar atelectasis. Electronically Signed   By: Keith Rake M.D.   On: 04/17/2019 03:00    Recent Labs: Lab Results  Component Value Date    WBC 7.3 04/17/2019   HGB 12.0 04/17/2019   PLT 210 04/17/2019   NA 140 04/17/2019   K 3.8 04/17/2019   CL 105 04/17/2019   CO2 27 04/17/2019   GLUCOSE 91 04/17/2019   BUN 11 04/17/2019   CREATININE 0.61 04/17/2019   BILITOT 0.4 01/07/2019   ALKPHOS 82 03/14/2017   AST 31 01/07/2019   ALT 23 01/07/2019   PROT 7.1 01/07/2019   ALBUMIN 3.5 (L) 03/14/2017   CALCIUM 9.0 04/17/2019   GFRAA >60 04/17/2019    Speciality Comments: PLQ eye exam: 02/09/19 Normal. [facility]. Follow up in 1 year  Procedures:  No procedures performed Allergies: Codeine, Atorvastatin, Methotrexate, Methotrexate derivatives, and Quinapril hcl   Assessment / Plan:     Visit Diagnoses: No diagnosis found.  Orders: No orders of the defined types were placed in this encounter.  No orders of the defined types were placed in this encounter.   Face-to-face time spent with patient was *** minutes. Greater than 50% of time was spent in counseling and coordination of care.  Follow-Up Instructions: No follow-ups on file.   Ofilia Neas, PA-C  Note - This record has been created using Dragon software.  Chart creation errors have been sought, but may not always  have been located. Such creation  errors do not reflect on  the standard of medical care.

## 2019-05-19 ENCOUNTER — Emergency Department (HOSPITAL_COMMUNITY)
Admission: EM | Admit: 2019-05-19 | Discharge: 2019-05-20 | Disposition: A | Discharge status: Home / Self Care | Source: Home / Self Care | Attending: Emergency Medicine | Admitting: Emergency Medicine

## 2019-05-19 ENCOUNTER — Other Ambulatory Visit: Payer: Self-pay

## 2019-05-19 ENCOUNTER — Emergency Department (HOSPITAL_COMMUNITY)
Admission: EM | Admit: 2019-05-19 | Discharge: 2019-05-19 | Discharge status: Against Medical Advice | Attending: Emergency Medicine | Admitting: Emergency Medicine

## 2019-05-19 ENCOUNTER — Emergency Department (HOSPITAL_COMMUNITY)

## 2019-05-19 ENCOUNTER — Encounter (HOSPITAL_COMMUNITY): Payer: Self-pay | Admitting: Family Medicine

## 2019-05-19 DIAGNOSIS — E039 Hypothyroidism, unspecified: Secondary | ICD-10-CM | POA: Insufficient documentation

## 2019-05-19 DIAGNOSIS — R0789 Other chest pain: Secondary | ICD-10-CM | POA: Diagnosis present

## 2019-05-19 DIAGNOSIS — Z5321 Procedure and treatment not carried out due to patient leaving prior to being seen by health care provider: Secondary | ICD-10-CM | POA: Insufficient documentation

## 2019-05-19 DIAGNOSIS — Z794 Long term (current) use of insulin: Secondary | ICD-10-CM | POA: Insufficient documentation

## 2019-05-19 DIAGNOSIS — I10 Essential (primary) hypertension: Secondary | ICD-10-CM | POA: Insufficient documentation

## 2019-05-19 DIAGNOSIS — E119 Type 2 diabetes mellitus without complications: Secondary | ICD-10-CM | POA: Insufficient documentation

## 2019-05-19 DIAGNOSIS — Z87891 Personal history of nicotine dependence: Secondary | ICD-10-CM | POA: Insufficient documentation

## 2019-05-19 DIAGNOSIS — N12 Tubulo-interstitial nephritis, not specified as acute or chronic: Secondary | ICD-10-CM

## 2019-05-19 DIAGNOSIS — Z20828 Contact with and (suspected) exposure to other viral communicable diseases: Secondary | ICD-10-CM | POA: Insufficient documentation

## 2019-05-19 DIAGNOSIS — Z79899 Other long term (current) drug therapy: Secondary | ICD-10-CM | POA: Insufficient documentation

## 2019-05-19 LAB — TROPONIN I (HIGH SENSITIVITY): Troponin I (High Sensitivity): 8 ng/L (ref <18)

## 2019-05-19 LAB — BASIC METABOLIC PANEL
Anion gap: 13 (ref 5–15)
BUN: 14 mg/dL (ref 8–23)
CO2: 23 mmol/L (ref 22–32)
Calcium: 9 mg/dL (ref 8.9–10.3)
Chloride: 100 mmol/L (ref 98–111)
Creatinine, Ser: 0.97 mg/dL (ref 0.44–1.00)
GFR calc Af Amer: >60 mL/min (ref >60)
GFR calc non Af Amer: >60 mL/min (ref >60)
Glucose, Bld: 146 mg/dL — ABNORMAL HIGH (ref 70–99)
Potassium: 4.2 mmol/L (ref 3.5–5.1)
Sodium: 136 mmol/L (ref 135–145)

## 2019-05-19 LAB — CBC
HCT: 43 % (ref 36.0–46.0)
Hemoglobin: 13.3 g/dL (ref 12.0–15.0)
MCH: 28.2 pg (ref 26.0–34.0)
MCHC: 30.9 g/dL (ref 30.0–36.0)
MCV: 91.1 fL (ref 80.0–100.0)
Platelets: 198 10*3/uL (ref 150–400)
RBC: 4.72 MIL/uL (ref 3.87–5.11)
RDW: 15.8 % — ABNORMAL HIGH (ref 11.5–15.5)
WBC: 9.7 10*3/uL (ref 4.0–10.5)
nRBC: 0 % (ref 0.0–0.2)

## 2019-05-19 LAB — CBG MONITORING, ED
Glucose-Capillary: 131 mg/dL — ABNORMAL HIGH (ref 70–99)
Glucose-Capillary: 180 mg/dL — ABNORMAL HIGH (ref 70–99)
Glucose-Capillary: 277 mg/dL — ABNORMAL HIGH (ref 70–99)

## 2019-05-19 MED ORDER — ACETAMINOPHEN 325 MG PO TABS
650.0000 mg | ORAL_TABLET | Freq: Once | ORAL | Status: AC | PRN
  Administered 2019-05-19: 650 mg via ORAL
  Filled 2019-05-19: qty 2

## 2019-05-19 MED ORDER — SODIUM CHLORIDE 0.9% FLUSH: 3.0000 mL | Freq: Once | INTRAVENOUS | Status: DC

## 2019-05-19 NOTE — ED Notes (Signed)
Pt said she felt better and her blood sugar was ok now and she wanted to leave.

## 2019-05-19 NOTE — ED Notes (Signed)
Deferred chest x-ray since patient had a chest x-ray at [facility] earlier this morning/

## 2019-05-19 NOTE — ED Triage Notes (Signed)
Patient with back pain, hypoglycemic, with nausea and vomiting.  Patient then started having chest pain, non radiating, CBG 49 and was given oral glucose with orange juice and some D10.  She is no longer tachy but was 130, now 93.

## 2019-05-19 NOTE — ED Notes (Signed)
Gloria Porter, NT placed patient on 2L of oxygen via [place] due to patients oxygen level dropping to 88%. Once patient was placed on oxygen, her saturation was 95%.

## 2019-05-19 NOTE — ED Triage Notes (Signed)
Per EMS- Patient c/o SOB and anxiety, Patient was seen at [facility] yesterday for hypoglycemia and fever.  Patient got tired of waiting and left yesterday prior to being seen.

## 2019-05-20 LAB — URINALYSIS, ROUTINE W REFLEX MICROSCOPIC
Bilirubin Urine: NEGATIVE
Glucose, UA: >=500 mg/dL — AB
Ketones, ur: NEGATIVE mg/dL
Nitrite: NEGATIVE
Protein, ur: NEGATIVE mg/dL
Specific Gravity, Urine: 1.018 (ref 1.005–1.030)
WBC, UA: >50 WBC/hpf — ABNORMAL HIGH (ref 0–5)
pH: 5 (ref 5.0–8.0)

## 2019-05-20 LAB — CBC
HCT: 37.3 % (ref 36.0–46.0)
Hemoglobin: 11.4 g/dL — ABNORMAL LOW (ref 12.0–15.0)
MCH: 27.9 pg (ref 26.0–34.0)
MCHC: 30.6 g/dL (ref 30.0–36.0)
MCV: 91.2 fL (ref 80.0–100.0)
Platelets: 159 10*3/uL (ref 150–400)
RBC: 4.09 MIL/uL (ref 3.87–5.11)
RDW: 16.3 % — ABNORMAL HIGH (ref 11.5–15.5)
WBC: 10.3 10*3/uL (ref 4.0–10.5)
nRBC: 0 % (ref 0.0–0.2)

## 2019-05-20 LAB — TROPONIN I (HIGH SENSITIVITY): Troponin I (High Sensitivity): 4 ng/L (ref <18)

## 2019-05-20 LAB — SARS CORONAVIRUS 2 BY RT PCR (HOSPITAL ORDER, PERFORMED IN ~~LOC~~ HOSPITAL LAB): SARS Coronavirus 2: NEGATIVE

## 2019-05-20 LAB — COMPREHENSIVE METABOLIC PANEL
ALT: 27 U/L (ref 0–44)
AST: 32 U/L (ref 15–41)
Albumin: 3.7 g/dL (ref 3.5–5.0)
Alkaline Phosphatase: 65 U/L (ref 38–126)
Anion gap: 11 (ref 5–15)
BUN: 20 mg/dL (ref 8–23)
CO2: 25 mmol/L (ref 22–32)
Calcium: 8.6 mg/dL — ABNORMAL LOW (ref 8.9–10.3)
Chloride: 100 mmol/L (ref 98–111)
Creatinine, Ser: 1.13 mg/dL — ABNORMAL HIGH (ref 0.44–1.00)
GFR calc Af Amer: >60 mL/min (ref >60)
GFR calc non Af Amer: 52 mL/min — ABNORMAL LOW (ref >60)
Glucose, Bld: 259 mg/dL — ABNORMAL HIGH (ref 70–99)
Potassium: 4.3 mmol/L (ref 3.5–5.1)
Sodium: 136 mmol/L (ref 135–145)
Total Bilirubin: 0.9 mg/dL (ref 0.3–1.2)
Total Protein: 7.5 g/dL (ref 6.5–8.1)

## 2019-05-20 LAB — LIPASE, BLOOD: Lipase: 17 U/L (ref 11–51)

## 2019-05-20 MED ORDER — CEPHALEXIN 500 MG PO CAPS: 500.0000 mg | ORAL_CAPSULE | Freq: Three times a day (TID) | ORAL | 0 refills | Status: AC

## 2019-05-20 MED ORDER — CEPHALEXIN 500 MG PO CAPS
500.0000 mg | ORAL_CAPSULE | Freq: Once | ORAL | Status: AC
  Administered 2019-05-20: 500 mg via ORAL
  Filled 2019-05-20: qty 1

## 2019-05-20 MED ORDER — IBUPROFEN 200 MG PO TABS
400.0000 mg | ORAL_TABLET | Freq: Once | ORAL | Status: AC
  Administered 2019-05-20: 400 mg via ORAL
  Filled 2019-05-20: qty 2

## 2019-05-20 MED ORDER — ONDANSETRON 4 MG PO TBDP: 4.0000 mg | ORAL_TABLET | Freq: Three times a day (TID) | ORAL | 0 refills | Status: AC | PRN

## 2019-05-20 NOTE — ED Notes (Signed)
Pt was verbalized discharge instructions. Pt had no further questions at this time. NAD. 

## 2019-05-20 NOTE — ED Provider Notes (Signed)
[facility] DEPT Provider Note  CSN: [id number] Arrival date & time: 05/19/19 1857  Chief Complaint(s) Shortness of Breath and Anxiety  HPI Gloria Porter is a 63 y.o. 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  Yesterday she was seen at [facility] after having low blood sugars.  She left before being evaluated by a provider after her blood sugar levels stabilized.  She returns today because of the episodic shaking.  Shaking is the entire body patient patient remains conscious throughout the episode.  Endorses intermittent headache but currently has no headache.  No chest pain, shortness of breath.  Mild nausea without emesis.  No abdominal pain.  No urinary symptoms.  No diarrhea.  No known sick contacts. Complains of lower back pain.  The history is provided by the patient.    Past Medical History Past Medical History:  Diagnosis Date   Anxiety    Arthritis    Cushing's disease ([place])    pituitary tumor removed 2012   Diabetes mellitus    Endocrinologist Dr. Iran Planas   Difficult intubation    2011 scratched trachea   Dyspnea    upon exertion   Fibromyalgia    GERD (gastroesophageal reflux disease)    Headache(784.0)    Hypertension    Hypothyroidism    Lumbar disc disease    MGUS (monoclonal gammopathy of unknown significance)    Mixed connective tissue disease ([place])    Neuropathy    Pneumonia    Sjogren's disease ([place])    Sleep apnea    no cpap.sleep study 2006 [place]   Patient Active Problem List   Diagnosis Date Noted   Pseudoarthrosis of lumbar spine 03/02/2019   Primary osteoarthritis of both hands 04/10/2017   Primary osteoarthritis of both knees 04/10/2017   DDD (degenerative  disc disease), lumbar 04/10/2017   Mixed connective tissue disease ([place]) 04/10/2017   S/P trigger finger release bilateral third 03/14/2017   MGUS (monoclonal gammopathy of unknown significance) 03/11/2017   History of chronic kidney disease 03/11/2017   Monoclonal paraproteinemia 01/11/2016   Restless leg 01/11/2015   Carpal tunnel syndrome 12/28/2014   Dysphagia, oropharyngeal 12/28/2014   Anxiety, generalized 10/14/2014   Generalized joint pain 08/17/2014   Encounter for therapeutic drug monitoring 07/13/2014   Failed back syndrome of lumbar spine 07/13/2014   Encounter for therapeutic drug level monitoring 07/13/2014   ACTH excess, central ([place]) 06/16/2014   Diabetes ([place]) 06/16/2014   Fibrositis 06/16/2014   Cannot sleep 06/16/2014   Extreme obesity 06/16/2014   Neuropathy 06/16/2014   Allergic rhinitis, seasonal 06/16/2014   Post menopausal syndrome 06/16/2014   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   Spinal stenosis 06/16/2014   Adult hypothyroidism 06/16/2014   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[place]) 06/16/2014   Diabetes mellitus ([place]) 06/16/2014   Fibromyalgia 06/16/2014   Morbid obesity ([place]) 06/16/2014   Sjogren's syndrome ([place]) 06/16/2014   Postmenopausal HRT (hormone replacement therapy) 06/16/2014   Breath shortness 01/21/2014   Essential (primary) hypertension 01/21/2014   Acid reflux 01/21/2014   Adiposity 01/21/2014   Clinical depression 08/26/2013   Obstructive apnea 01/17/2013   Cushing's syndrome ([place]) 05/23/2011   Pituitary adenoma ([place]) 05/23/2011   Home Medication(s) Prior to Admission medications   Medication Sig Start Date End Date Taking? Authorizing Provider  APPLE CIDER VINEGAR PO Take 400  mg by mouth 3 (three) times daily.    Yes [provider]  aspirin-acetaminophen-caffeine (EXCEDRIN MIGRAINE) [phone] MG tablet Take 3 tablets by mouth daily as needed for headache.   Yes [provider]    buPROPion (WELLBUTRIN XL) 150 MG 24 hr tablet Take 150 mg by mouth daily.   Yes [provider]  Calcium Carb-Cholecalciferol (CALCIUM 1000 + D PO) Take 1 tablet by mouth daily.    Yes [provider]  calcium carbonate (TUMS - DOSED IN MG ELEMENTAL CALCIUM) 500 MG chewable tablet Chew 3 tablets by mouth daily as needed for indigestion or heartburn.   Yes [provider]  cetirizine (ZYRTEC) 10 MG tablet Take 10 mg by mouth daily.    Yes [provider]  diclofenac sodium (VOLTAREN) 1 % GEL Apply 1 application topically daily as needed (knee / hand pain).  06/06/17  Yes [provider]  dimenhyDRINATE (DRAMAMINE) 50 MG tablet Take 50 mg by mouth daily as needed for dizziness.   Yes [provider]  diphenhydramine-acetaminophen (TYLENOL PM) 25-500 MG TABS tablet Take 3 tablets by mouth at bedtime as needed (pain/sleep).   Yes [provider]  empagliflozin (JARDIANCE) 25 MG TABS tablet Take 25 mg by mouth daily.  02/19/18  Yes [provider]  folic acid (FOLVITE) 1 MG tablet TAKE 2 TABLETS(2 MG) BY MOUTH DAILY Patient taking differently: Take 1 mg by mouth daily.  09/26/18  Yes Deveshwar, Abel Presto, MD  furosemide (LASIX) 40 MG tablet Take 40 mg by mouth daily.   Yes [provider]  HORIZANT 600 MG TBCR Take 1 tablet (600 mg total) by mouth 2 (two) times daily. Patient taking differently: Take 1,200 mg by mouth daily with supper.  09/11/18  Yes Sater, Nanine Means, MD  HYDROcodone-acetaminophen (NORCO/VICODIN) 5-325 MG tablet Take 1 tablet by mouth every 4 (four) hours as needed for moderate pain.   Yes [provider]  hydroxychloroquine (PLAQUENIL) 200 MG tablet TAKE 1 TABLET TWICE A DAY Patient taking differently: Take 200 mg by mouth 2 (two) times daily.  03/27/19  Yes Deveshwar, Abel Presto, MD  Insulin Regular Human (HUMULIN R U-500, CONCENTRATED, [place]) Inject 0.05-0.25 mLs into the skin 3 (three) times daily as needed  (for high blood sugar).    Yes [provider]  lidocaine (XYLOCAINE) 5 % ointment Apply 1 application topically as needed. Patient taking differently: Apply 1 application topically 2 (two) times daily as needed (toe pain).  09/11/18  Yes Sater, Nanine Means, MD  LORazepam (ATIVAN) 1 MG tablet Take 3 mg by mouth at bedtime.  08/25/17  Yes [provider]  losartan (COZAAR) 50 MG tablet Take 50 mg by mouth daily after supper.  12/23/14 04/16/28 Yes [provider]  pantoprazole (PROTONIX) 40 MG tablet Take 1 tablet (40 mg total) by mouth daily. [id number]  Yes Delora Fuel, MD  Polyethyl Glycol-Propyl Glycol (SYSTANE OP) Place 1 drop into both eyes at bedtime as needed (dry eyes).   Yes [provider]  promethazine (PHENERGAN) 12.5 MG tablet Take 1 tablet (12.5 mg total) by mouth every 6 (six) hours as needed for nausea or vomiting. 03/06/19  Yes Viona Gilmore D, NP  Semaglutide, 1 MG/DOSE, (OZEMPIC, 1 MG/DOSE,) 2 MG/1.5ML SOPN Inject 1 mg into the skin every Thursday. 04/14/19  Yes [provider]  thyroid (ARMOUR) 240 MG tablet Take 240 mg by mouth daily.    Yes [provider]  ACCU-CHEK FASTCLIX LANCETS [place]  03/23/14  [provider]  cephALEXin (KEFLEX) 500 MG capsule Take 1 capsule (500 mg total) by mouth 3 (three) times daily for 14 days. 05/20/19 06/03/19  Fatima Blank, MD  FREESTYLE LITE test strip daily. 08/22/17   [provider]  HYDROcodone-acetaminophen (NORCO) 10-325 MG tablet Take 1 tablet by mouth every 4 (four) hours as needed for moderate pain. Patient not taking: Reported on 05/20/2019 03/06/19   Viona Gilmore D, NP  Insulin Syringe-Needle U-100 (BD INSULIN SYRINGE U/F) 31G X 5/16" 0.5 ML MISC USE TO INJECT UP TO THREE TIMES A DAY 07/28/18   [provider]  ondansetron (ZOFRAN ODT) 4 MG disintegrating tablet Take 1 tablet (4 mg total) by mouth every 8 (eight) hours as needed for up to 3 days for nausea  or vomiting. 05/20/19 05/23/19  Fatima Blank, MD  gabapentin (NEURONTIN) 300 MG capsule Take 1 capsule (300 mg total) by mouth 3 (three) times daily. Patient not taking: Reported on 04/17/2019 03/06/19 04/17/19  Viona Gilmore D, NP                                                                                                                                    Past Surgical History Past Surgical History:  Procedure Laterality Date   APPENDECTOMY     BACK SURGERY     lumbar fusion   BRAIN SURGERY     For cushings   CARPAL TUNNEL RELEASE Bilateral    CESAREAN SECTION     COLONOSCOPY     COMBINED HYSTEROSCOPY DIAGNOSTIC / D&C     ENDOMETRIAL ABLATION     ESOPHAGOGASTRODUODENOSCOPY ENDOSCOPY     FRACTURE SURGERY     l arm   LUMBAR LAMINECTOMY/DECOMPRESSION MICRODISCECTOMY  11/13/2011   Procedure: LUMBAR LAMINECTOMY/DECOMPRESSION MICRODISCECTOMY;  Surgeon: Faythe Ghee, MD;  Location: [facility];  Service: Neurosurgery;  Laterality: Bilateral;  Bilateral Lumbar four-five Decompression   TONSILLECTOMY     TRANSPHENOIDAL / TRANSNASAL HYPOPHYSECTOMY / RESECTION PITUITARY TUMOR     TRIGGER FINGER RELEASE     TUBAL LIGATION     Family History Family History  Problem Relation Age of Onset   Diabetes type II Mother    Diabetes type II Father    Heart disease Father    Heart attack Father    Colon cancer Maternal Grandmother    Heart disease Maternal Grandfather     Social History Social History   Tobacco Use   Smoking status: Former Smoker    Packs/day: 1.00    Years: 32.00    Pack years: 32.00    Types: Cigarettes    Quit date: 07/27/2006    Years since quitting: 12.8   Smokeless tobacco: Never Used  Substance Use Topics   Alcohol use: Yes    Comment: rarely   Drug use: No   Allergies Codeine, Atorvastatin, Methotrexate, Methotrexate derivatives, and Quinapril hcl  Review of Systems Review of  Systems All other systems are reviewed and  are negative for acute change except as noted in the HPI  Physical Exam Vital Signs  I have reviewed the triage vital signs BP 125/55   Pulse 84    Temp (!) 100.7 F (38.2 C) (Oral)    Resp 20    Ht 5\' 3"  (1.6 m)    Wt 117 kg    LMP 11/08/2006    SpO2 97%    BMI 45.70 kg/m   Physical Exam Vitals signs reviewed.  Constitutional:      General: She is not in acute distress.    Appearance: She is well-developed. She is not diaphoretic.  HENT:     Head: Normocephalic and atraumatic.     Nose: Nose normal.  Eyes:     General: No scleral icterus.       Right eye: No discharge.        Left eye: No discharge.     Conjunctiva/sclera: Conjunctivae normal.     Pupils: Pupils are equal, round, and reactive to light.  Neck:     Musculoskeletal: Normal range of motion and neck supple.  Cardiovascular:     Rate and Rhythm: Normal rate and regular rhythm.     Heart sounds: No murmur. No friction rub. No gallop.   Pulmonary:     Effort: Pulmonary effort is normal. No respiratory distress.     Breath sounds: Normal breath sounds. No stridor. No decreased breath sounds, wheezing, rhonchi or rales.  Abdominal:     General: There is no distension.     Palpations: Abdomen is soft.     Tenderness: There is no abdominal tenderness.  Musculoskeletal:        General: No tenderness.  Skin:    General: Skin is warm and dry.     Findings: No erythema or rash.  Neurological:     Mental Status: She is alert and oriented to person, place, and time.     ED Results and Treatments Labs (all labs ordered are listed, but only abnormal results are displayed) Labs Reviewed  CBC - Abnormal; Notable for the following components:      Result Value   Hemoglobin 11.4 (*)    RDW 16.3 (*)    All other components within normal limits  COMPREHENSIVE METABOLIC PANEL - Abnormal; Notable for the following components:   Glucose, Bld 259 (*)    Creatinine, Ser 1.13 (*)    Calcium 8.6 (*)    GFR calc non Af Amer  52 (*)    All other components within normal limits  URINALYSIS, ROUTINE W REFLEX MICROSCOPIC - Abnormal; Notable for the following components:   APPearance CLOUDY (*)    Glucose, UA >=500 (*)    Hgb urine dipstick SMALL (*)    Leukocytes,Ua LARGE (*)    WBC, UA >50 (*)    Bacteria, UA MANY (*)    All other components within normal limits  CBG MONITORING, ED - Abnormal; Notable for the following components:   Glucose-Capillary 131 (*)    All other components within normal limits  CBG MONITORING, ED - Abnormal; Notable for the following components:   Glucose-Capillary 277 (*)    All other components within normal limits  SARS CORONAVIRUS 2 (HOSPITAL ORDER, [facility])  LIPASE, BLOOD  TROPONIN I (HIGH SENSITIVITY)  EKG  EKG Interpretation  Date/Time:  Tuesday May 19 2019 19:26:30 EDT Ventricular Rate:  93 PR Interval:    QRS Duration: 114 QT Interval:  371 QTC Calculation: 462 R Axis:   -50 Text Interpretation:  Sinus rhythm LAD, consider left anterior fascicular block Anterior infarct, old No significant change since last tracing Confirmed by Addison Lank [phone]) on 05/19/2019 11:43:48 PM      Radiology Dg Chest 2 View  Result Date: 05/19/2019 CLINICAL DATA:  Chest pain EXAM: CHEST - 2 VIEW COMPARISON:  April 17, 2019 FINDINGS: The heart size and mediastinal contours are within normal limits. Both lungs are clear. The visualized skeletal structures are unremarkable. IMPRESSION: No acute cardiopulmonary process. Electronically Signed   By: Prudencio Pair M.D.   On: 05/19/2019 01:06    Pertinent labs & imaging results that were available during my care of the patient were reviewed by me and considered in my medical decision making (see chart for details).  Medications Ordered in ED Medications  acetaminophen (TYLENOL) tablet  650 mg (650 mg Oral Given 05/19/19 1941)  cephALEXin (KEFLEX) capsule 500 mg (500 mg Oral Given 05/20/19 0203)                                                                                                                                    Procedures Procedures  (including critical care time)  Medical Decision Making / ED Course I have reviewed the nursing notes for this encounter and the patient's prior records (if available in EHR or on provided paperwork).   [name] was evaluated in Emergency Department on 05/20/2019 for the symptoms described in the history of present illness. She was evaluated in the context of the global COVID-19 pandemic, which necessitated consideration that the patient might be at risk for infection with the SARS-CoV-2 virus that causes COVID-19. Institutional protocols and algorithms that pertain to the evaluation of patients at risk for COVID-19 are in a state of rapid change based on information released by regulatory bodies including the CDC and federal and state organizations. These policies and algorithms were followed during the patient's care in the ED.  Work-up notable for urinary tract infection.  Patient without leukocytosis or significant anemia.  No significant electrolyte derangements or renal insufficiency.  Patient does have hyperglycemia without evidence of DKA.  No evidence of bili obstruction or pancreatitis.  COVID was negative.  Given fever, will treat patient like pyelonephritis.  Patient does not appear to be septic at this time.  Appropriate for outpatient management.  The patient appears reasonably screened and/or stabilized for discharge and I doubt any other medical condition or other [facility] requiring further screening, evaluation, or treatment in the ED at this time prior to discharge.  The patient is safe for discharge with strict return precautions.       Final Clinical Impression(s) / ED Diagnoses Final diagnoses:    Pyelonephritis  The patient appears reasonably screened and/or stabilized for discharge and I doubt any other medical condition or other [facility] requiring further screening, evaluation, or treatment in the ED at this time prior to discharge.  Disposition: Discharge  Condition: Good  I have discussed the results, Dx and Tx plan with the patient who expressed understanding and agree(s) with the plan. Discharge instructions discussed at great length. The patient was given strict return precautions who verbalized understanding of the instructions. No further questions at time of discharge.    ED Discharge Orders         Ordered    ondansetron (ZOFRAN ODT) 4 MG disintegrating tablet  Every 8 hours PRN     05/20/19 0253    cephALEXin (KEFLEX) 500 MG capsule  3 times daily     05/20/19 0253           Follow Up: Florestine Avers [address] [place] [address]  Schedule an appointment as soon as possible for a visit  in 10-14 days for repeat urine analysis to ensure appropriate treatment.      This chart was dictated using voice recognition software.  Despite best efforts to proofread,  errors can occur which can change the documentation meaning.   Fatima Blank, MD 05/20/19 [phone]

## 2019-05-20 NOTE — Progress Notes (Deleted)
Office Visit Note  Patient: Gloria Porter             Date of Birth: 1955-08-20           MRN: [id number]             PCP: Florestine Avers (Inactive) Referring: No ref. provider found Visit Date: 06/03/2019 Occupation: @GUAROCC @  Subjective:  No chief complaint on file.   History of Present Illness: Gloria Porter is a 63 y.o. female ***   Activities of Daily Living:  Patient reports morning stiffness for *** {minute/hour:19697}.   Patient {ACTIONS;DENIES/REPORTS:21021675::"Denies"} nocturnal pain.  Difficulty dressing/grooming: {ACTIONS;DENIES/REPORTS:21021675::"Denies"} Difficulty climbing stairs: {ACTIONS;DENIES/REPORTS:21021675::"Denies"} Difficulty getting out of chair: {ACTIONS;DENIES/REPORTS:21021675::"Denies"} Difficulty using hands for taps, buttons, cutlery, and/or writing: {ACTIONS;DENIES/REPORTS:21021675::"Denies"}  No Rheumatology ROS completed.   PMFS History:  Patient Active Problem List   Diagnosis Date Noted  . Pseudoarthrosis of lumbar spine 03/02/2019  . Primary osteoarthritis of both hands 04/10/2017  . Primary osteoarthritis of both knees 04/10/2017  . DDD (degenerative disc disease), lumbar 04/10/2017  . Mixed connective tissue disease ([place]) 04/10/2017  . S/P trigger finger release bilateral third 03/14/2017  . MGUS (monoclonal gammopathy of unknown significance) 03/11/2017  . History of chronic kidney disease 03/11/2017  . Monoclonal paraproteinemia 01/11/2016  . Restless leg 01/11/2015  . Carpal tunnel syndrome 12/28/2014  . Dysphagia, oropharyngeal 12/28/2014  . Anxiety, generalized 10/14/2014  . Generalized joint pain 08/17/2014  . Encounter for therapeutic drug monitoring 07/13/2014  . Failed back syndrome of lumbar spine 07/13/2014  . Encounter for therapeutic drug level monitoring 07/13/2014  . ACTH excess, central ([place]) 06/16/2014  . Diabetes ([place]) 06/16/2014  . Fibrositis 06/16/2014  . Cannot sleep 06/16/2014  . Extreme  obesity 06/16/2014  . Neuropathy 06/16/2014  . Allergic rhinitis, seasonal 06/16/2014  . Post menopausal syndrome 06/16/2014  . 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 Spinal stenosis 06/16/2014  . Adult hypothyroidism 06/16/2014  . 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[place]) 06/16/2014  . Diabetes mellitus ([place]) 06/16/2014  . Fibromyalgia 06/16/2014  . Morbid obesity ([place]) 06/16/2014  . Sjogren's syndrome ([place]) 06/16/2014  . Postmenopausal HRT (hormone replacement therapy) 06/16/2014  . Breath shortness 01/21/2014  . Essential (primary) hypertension 01/21/2014  . Acid reflux 01/21/2014  . Adiposity 01/21/2014  . Clinical depression 08/26/2013  . Obstructive apnea 01/17/2013  . Cushing's syndrome ([place]) 05/23/2011  . Pituitary adenoma ([place]) 05/23/2011    Past Medical History:  Diagnosis Date  . Anxiety   . Arthritis   . 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 Diabetes mellitus    Endocrinologist Dr. Iran Planas  . Difficult intubation    2011 scratched trachea  . Dyspnea    upon exertion  . Fibromyalgia   . GERD (gastroesophageal reflux disease)   . Headache(784.0)   . Hypertension   . Hypothyroidism   . Lumbar disc disease   . MGUS (monoclonal gammopathy of unknown significance)   . Mixed connective tissue disease ([place])   . Neuropathy   . Pneumonia   . Sjogren's disease ([place])   . Sleep apnea    no cpap.sleep study 2006 [place]    Family History  Problem Relation Age of Onset  . Diabetes type II Mother   . Diabetes type II Father   . Heart disease Father   . Heart attack Father   . Colon cancer Maternal Grandmother   . Heart disease Maternal Grandfather    Past Surgical History:  Procedure Laterality Date  .  APPENDECTOMY    . BACK SURGERY     lumbar fusion  . BRAIN SURGERY     For cushings  . CARPAL TUNNEL RELEASE Bilateral   . CESAREAN SECTION    . COLONOSCOPY    . COMBINED HYSTEROSCOPY DIAGNOSTIC / D&C    . ENDOMETRIAL ABLATION     . ESOPHAGOGASTRODUODENOSCOPY ENDOSCOPY    . FRACTURE SURGERY     l arm  . LUMBAR LAMINECTOMY/DECOMPRESSION MICRODISCECTOMY  11/13/2011   Procedure: LUMBAR LAMINECTOMY/DECOMPRESSION MICRODISCECTOMY;  Surgeon: Faythe Ghee, MD;  Location: [facility]UR[facility];  Service: Neurosurgery;  Laterality: Bilateral;  Bilateral Lumbar four-five Decompression  . TONSILLECTOMY    . TRANSPHENOIDAL / TRANSNASAL HYPOPHYSECTOMY / RESECTION PITUITARY TUMOR    . TRIGGER FINGER RELEASE    . TUBAL LIGATION     Social History   Social History Narrative  . Not on file   Immunization History  Administered Date(s) Administered  . Influenza Inj Mdck Quad Pf 07/12/2017, 07/14/2018  . Influenza Split 06/13/2017  . Pneumococcal Conjugate-13 12/28/2014  . Pneumococcal Polysaccharide-23 01/03/2017     Objective: Vital Signs: LMP 11/08/2006    Physical Exam   Musculoskeletal Exam: ***  CDAI Exam: CDAI Score: - Patient Global: -; Provider Global: - Swollen: -; Tender: - Joint Exam   No joint exam has been documented for this visit   There is currently no information documented on the homunculus. Go to the Rheumatology activity and complete the homunculus joint exam.  Investigation: No additional findings.  Imaging: Dg Chest 2 View  Result Date: 05/19/2019 CLINICAL DATA:  Chest pain EXAM: CHEST - 2 VIEW COMPARISON:  April 17, 2019 FINDINGS: The heart size and mediastinal contours are within normal limits. Both lungs are clear. The visualized skeletal structures are unremarkable. IMPRESSION: No acute cardiopulmonary process. Electronically Signed   By: Prudencio Pair M.D.   On: 05/19/2019 01:06    Recent Labs: Lab Results  Component Value Date   WBC 10.3 05/20/2019   HGB 11.4 (L) 05/20/2019   PLT 159 05/20/2019   NA 136 05/20/2019   K 4.3 05/20/2019   CL 100 05/20/2019   CO2 25 05/20/2019   GLUCOSE 259 (H) 05/20/2019   BUN 20 05/20/2019   CREATININE 1.13 (H) 05/20/2019   BILITOT 0.9 05/20/2019    ALKPHOS 65 05/20/2019   AST 32 05/20/2019   ALT 27 05/20/2019   PROT 7.5 05/20/2019   ALBUMIN 3.7 05/20/2019   CALCIUM 8.6 (L) 05/20/2019   GFRAA >60 05/20/2019    Speciality Comments: PLQ eye exam: 02/09/19 Normal. [facility]. Follow up in 1 year  Procedures:  No procedures performed Allergies: Codeine, Atorvastatin, Methotrexate, Methotrexate derivatives, and Quinapril hcl   Assessment / Plan:     Visit Diagnoses: No diagnosis found.  Orders: No orders of the defined types were placed in this encounter.  No orders of the defined types were placed in this encounter.   Face-to-face time spent with patient was *** minutes. Greater than 50% of time was spent in counseling and coordination of care.  Follow-Up Instructions: No follow-ups on file.   Earnestine Mealing, CMA  Note - This record has been created using Editor, commissioning.  Chart creation errors have been sought, but may not always  have been located. Such creation errors do not reflect on  the standard of medical care.

## 2019-05-20 NOTE — ED Notes (Signed)
Urine and culture sent to lab  

## 2019-05-21 ENCOUNTER — Ambulatory Visit: Admitting: Physician Assistant

## 2019-06-03 ENCOUNTER — Ambulatory Visit: Admitting: Physician Assistant

## 2019-06-30 ENCOUNTER — Other Ambulatory Visit (HOSPITAL_COMMUNITY): Payer: Self-pay | Admitting: Gastroenterology

## 2019-06-30 ENCOUNTER — Other Ambulatory Visit: Payer: Self-pay | Admitting: Gastroenterology

## 2019-06-30 DIAGNOSIS — R110 Nausea: Secondary | ICD-10-CM

## 2019-07-07 ENCOUNTER — Other Ambulatory Visit: Payer: Self-pay

## 2019-07-07 ENCOUNTER — Encounter (HOSPITAL_COMMUNITY)

## 2019-07-07 ENCOUNTER — Encounter (HOSPITAL_COMMUNITY)
Admission: RE | Admit: 2019-07-07 | Discharge: 2019-07-07 | Disposition: A | Discharge status: Home / Self Care | Source: Ambulatory Visit | Attending: Gastroenterology | Admitting: Gastroenterology

## 2019-07-07 DIAGNOSIS — R110 Nausea: Secondary | ICD-10-CM

## 2019-07-07 MED ORDER — TECHNETIUM TC 99M MEBROFENIN IV KIT
5.2000 | PACK | Freq: Once | INTRAVENOUS | Status: AC | PRN
  Administered 2019-07-07: 5.2 via INTRAVENOUS

## 2019-07-23 NOTE — Progress Notes (Signed)
Virtual Visit via Telephone Note  I connected with Gloria Porter on 07/27/19 at 12:00 PM EST by telephone and verified that I am speaking with the correct person using two identifiers.  Location: Patient: Home Provider: Clinic  This service was conducted via virtual visit.  The patient was located at home. I was located in my office.  Consent was obtained prior to the virtual visit and is aware of possible charges through their insurance for this visit.  The patient is an established patient.  Dr. Estanislado Pandy, MD conducted the virtual visit and Hazel Sams, PA-C acted as scribe during the service.  Office staff helped with scheduling follow up visits after the service was conducted.   I discussed the limitations, risks, security and privacy concerns of performing an evaluation and management service by telephone and the availability of in person appointments. I also discussed with the patient that there may be a patient responsible charge related to this service. The patient expressed understanding and agreed to proceed.  CC: Pain in both hands  History of Present Illness: Patient is a 63 year old female with past medical history of mixed connective tissue disease, Sjogren's, osteoarthritis, and fibromyalgia. She takes plaquenil 200 mg 1 tablet by mouth twice daily. She has ongoing pain and inflammation in both hands.  She has intermittent pain in both knee joints. She has chronic lower back pain, especially if standing for prolonged periods of time.  She wears a back brace on occasion.  She has chronic sicca symptoms.  She uses OTC products for symptomatic relief.  She denies any recent rashes. She has dry skin patches which are chronic and she uses ceraVe topically. She has fibromyalgia tender points on the medial aspect of both knees and feet.  She tries to avoid triggers.   She is scheduled for cholecystectomy on 08/12/19.  Review of Systems  Constitutional: Negative for fever and  malaise/fatigue.  HENT:       +Dry mouth  Eyes: Negative for photophobia, pain, discharge and redness.       +Dry eyes-using systane drops  Respiratory: Negative for cough, shortness of breath and wheezing.   Cardiovascular: Negative for chest pain and palpitations.  Gastrointestinal: Negative for blood in stool, constipation and diarrhea.  Genitourinary: Negative for dysuria.  Musculoskeletal: Positive for joint pain. Negative for back pain, myalgias and neck pain.       +Morning stiffness  +Joint swelling +Muscle tenderness  Skin: Negative for rash.  Neurological: Negative for dizziness and headaches.  Psychiatric/Behavioral: Negative for depression. The patient is not nervous/anxious and does not have insomnia.       Observations/Objective: Physical Exam  Constitutional: She is oriented to person, place, and time.  Neurological: She is alert and oriented to person, place, and time.  Psychiatric: Mood, memory, affect and judgment normal.   Patient reports morning stiffness for 5 minutes.   Patient denies nocturnal pain.  Difficulty dressing/grooming: Denies Difficulty climbing stairs: Denies Difficulty getting out of chair: reports  Difficulty using hands for taps, buttons, cutlery, and/or writing: Denies   Assessment and Plan: Visit Diagnoses: Mixed connective tissue disease ([place]) - ANA 1:160 NS, RNP positive, inflammatory arthritis: She has not had any signs or symptoms of a flare recently.  She is clinically doing well on Plaquenil 200 mg 1 tablet by mouth twice daily.  She has ongoing pain and intermittent inflammation in both hands.  She has ongoing morning stiffness.  She has chronic fatigue related to insomnia and fibromyalgia.  She has not had any recent rashes or symptoms of Raynaud's. She has not had any oral or nasal ulcerations.  She has chronic sicca symptoms. She will continue taking plaquenil 200 mg 1 tablet by mouth twice daily.  We will check autoimmune lab work  with upcoming labs in January.  Future orders were placed today. She was advised to notify us if she develops any new or worsening symptoms.  She will follow up in 4-5 months.   High risk medication use - Plaquenil 200 mg twice daily.  Last Plaquenil eye exam normal on 06/19/2017. D/c MTX due to nausea and vomiting. she was advised that she can discontinue taking folic acid. CBC and CMP were drawn on 05/20/19.    Skin thickening - Secondary to diabetes.   Sjogren's syndrome with keratoconjunctivitis sicca ([place]) - She has chronic sicca symptoms.  She uses OTC products for symptomatic relief.   Primary osteoarthritis of both hands - She has chronic pain and intermittent inflammation in both hands.  Joint protection and muscle strengthening were discussed.    Primary osteoarthritis of both knees -She has intermittent pain in both knee joints.  No joint inflammation at this time. She has no difficulty climbing steps.  DDD (degenerative disc disease), lumbar - s/p laminectomy -Chronic pain.  She had a L5-S1 fusion on 03/02/2019 performed by Dr. Arnoldo Morale.  She has increased discomfort especially after standing for a prolonged period of time.    Fibromyalgia - She has generalized muscle aches and muscle tenderness due to fibromyalgia.  She has chronic fatigue related to insomnia and fibromyalgia.    Other medical conditions are listed as follows:   MGUS (monoclonal gammopathy of unknown significance) - She is followed by hematology.   History of depression   History of diabetes mellitus   History of chronic pain   History of Cushing disease   History of hypothyroidism   History of anxiety   History of chronic kidney disease  Palpitations    Follow Up Instructions: She will follow up in 4-5 months.    I discussed the assessment and treatment plan with the patient. The patient was provided an opportunity to ask questions and all were answered. The patient agreed with the  plan and demonstrated an understanding of the instructions.   The patient was advised to call back or seek an in-person evaluation if the symptoms worsen or if the condition fails to improve as anticipated.  I provided 15 minutes of non-face-to-face time during this encounter.   Bo Merino, MD    Scribed by-   Hazel Sams, PA-C

## 2019-07-27 ENCOUNTER — Other Ambulatory Visit: Payer: Self-pay

## 2019-07-27 ENCOUNTER — Ambulatory Visit: Admitting: Physician Assistant

## 2019-07-27 ENCOUNTER — Encounter: Payer: Self-pay | Admitting: Rheumatology

## 2019-07-27 ENCOUNTER — Telehealth (INDEPENDENT_AMBULATORY_CARE_PROVIDER_SITE_OTHER): Admitting: Rheumatology

## 2019-07-27 DIAGNOSIS — M351 Other overlap syndromes: Secondary | ICD-10-CM | POA: Diagnosis not present

## 2019-07-27 DIAGNOSIS — G8929 Other chronic pain: Secondary | ICD-10-CM

## 2019-07-27 DIAGNOSIS — R002 Palpitations: Secondary | ICD-10-CM

## 2019-07-27 DIAGNOSIS — M170 Bilateral primary osteoarthritis of knee: Secondary | ICD-10-CM | POA: Diagnosis not present

## 2019-07-27 DIAGNOSIS — Z8659 Personal history of other mental and behavioral disorders: Secondary | ICD-10-CM

## 2019-07-27 DIAGNOSIS — M51369 Other intervertebral disc degeneration, lumbar region without mention of lumbar back pain or lower extremity pain: Secondary | ICD-10-CM

## 2019-07-27 DIAGNOSIS — Z87448 Personal history of other diseases of urinary system: Secondary | ICD-10-CM

## 2019-07-27 DIAGNOSIS — M3501 Sicca syndrome with keratoconjunctivitis: Secondary | ICD-10-CM

## 2019-07-27 DIAGNOSIS — M19041 Primary osteoarthritis, right hand: Secondary | ICD-10-CM | POA: Diagnosis not present

## 2019-07-27 DIAGNOSIS — M5136 Other intervertebral disc degeneration, lumbar region: Secondary | ICD-10-CM

## 2019-07-27 DIAGNOSIS — Z79899 Other long term (current) drug therapy: Secondary | ICD-10-CM

## 2019-07-27 DIAGNOSIS — M797 Fibromyalgia: Secondary | ICD-10-CM

## 2019-07-27 DIAGNOSIS — M19042 Primary osteoarthritis, left hand: Secondary | ICD-10-CM

## 2019-07-27 DIAGNOSIS — Z8639 Personal history of other endocrine, nutritional and metabolic disease: Secondary | ICD-10-CM

## 2019-07-27 DIAGNOSIS — D472 Monoclonal gammopathy: Secondary | ICD-10-CM

## 2019-08-04 ENCOUNTER — Telehealth: Payer: Self-pay | Admitting: Rheumatology

## 2019-08-04 NOTE — Telephone Encounter (Signed)
-----   Message from Shona Needles, RT sent at 07/27/2019  2:13 PM EST ----- Regarding: 4-5 MONTH F/U

## 2019-08-04 NOTE — Telephone Encounter (Signed)
Called patient to schedule 4-5 month follow-up appointment.  Patient states she will call back tomorrow 08/05/19.

## 2019-08-11 ENCOUNTER — Other Ambulatory Visit: Payer: Self-pay | Admitting: Neurology

## 2019-09-08 ENCOUNTER — Other Ambulatory Visit: Payer: Self-pay | Admitting: Rheumatology

## 2019-09-08 DIAGNOSIS — M351 Other overlap syndromes: Secondary | ICD-10-CM

## 2019-09-08 NOTE — Telephone Encounter (Signed)
Last Visit: 07/27/19 Next Visit: 12/24/19 Labs: 05/20/19 Creat. 1.13, GFR 52, Calcium 8.6, Glucose 259, Hgb 11.4, RDW 16.3 Eye exam: 02/09/19 Normal.   Okay to refill per Dr. Estanislado Pandy

## 2019-09-16 ENCOUNTER — Other Ambulatory Visit: Payer: Self-pay

## 2019-09-16 ENCOUNTER — Encounter: Payer: Self-pay | Admitting: Neurology

## 2019-09-16 ENCOUNTER — Ambulatory Visit (INDEPENDENT_AMBULATORY_CARE_PROVIDER_SITE_OTHER): Admitting: Neurology

## 2019-09-16 VITALS — BP 130/61 | HR 78 | Temp 97.0°F | Ht 63.0 in | Wt 243.0 lb

## 2019-09-16 DIAGNOSIS — G2581 Restless legs syndrome: Secondary | ICD-10-CM

## 2019-09-16 DIAGNOSIS — M5136 Other intervertebral disc degeneration, lumbar region: Secondary | ICD-10-CM

## 2019-09-16 DIAGNOSIS — M351 Other overlap syndromes: Secondary | ICD-10-CM

## 2019-09-16 DIAGNOSIS — G629 Polyneuropathy, unspecified: Secondary | ICD-10-CM | POA: Diagnosis not present

## 2019-09-16 DIAGNOSIS — Z9889 Other specified postprocedural states: Secondary | ICD-10-CM | POA: Diagnosis not present

## 2019-09-16 DIAGNOSIS — D472 Monoclonal gammopathy: Secondary | ICD-10-CM | POA: Diagnosis not present

## 2019-09-16 MED ORDER — HORIZANT 600 MG PO TBCR: 600.0000 mg | EXTENDED_RELEASE_TABLET | Freq: Two times a day (BID) | ORAL | 3 refills | Status: DC

## 2019-09-16 MED ORDER — TAMSULOSIN HCL 0.4 MG PO CAPS: 0.4000 mg | ORAL_CAPSULE | Freq: Every day | ORAL | 11 refills | Status: DC

## 2019-09-16 NOTE — Progress Notes (Signed)
[facility]  PATIENT: Gloria Porter DOB: 09/17/1955  REFERRING DOCTOR OR PCP:  Idamae Schuller SOURCE: paitent  _________________________________   HISTORICAL  CHIEF COMPLAINT:  Chief Complaint  Patient presents with  . Follow-up    RM 12, alone. Last seen 09/11/2018.     HISTORY OF PRESENT ILLNESS:  Gloria Porter is a 64 y.o. woman with neuropathy and fibromyalgia.    Update 09/16/2019: She had lumbar surgery in July (L4L5 fusion had not taken so she needed a re-fusion).   She saw Dr. Arnoldo Morale for refusion L4L5 and new fusion L5S1.   The left sciatic nerve issues have improved.  Besides the radicular pain, she is noting aching in her arms and legs daily.  Sometimes she wakes up with it.   She stopped imipramine because urinary hesitancy worsened.   She continues on Horizant and tolerates it well.     She had a cholecysectomy last year as well.   She had a UTI a few times and then was found to have cholecystitis.   She will be seeing urology soon due to the frequent UTIs.    She has not tried Flomax or other med for hesitancy.    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   She sees Dr. Baird Cancer in [facility] but did not see in 2020.   Horizant and lidocaine cream have helped.  She also has MCTD and sees Dr. Estanislado Pandy.  She is on Plaquenil but the Methotrexate was stopped..   She rarely takes hydrocodone.  Update 09/11/2018: She feels her pain is mostly stable .  Her foot dysesthesias (Left >> right) are doing fairly well with Horizant plus Lidocaine ointment.    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  She was having a lot of hand/wrist pain and ankle pain and was diagnosed with mixed connective tissue disorder by Dr. Estanislado Pandy    Methotrexate and plaquenil has helped.   She takes 8 pills MTX every week as higher dose was not well tolerates.  She was seeing Dr. Rochele Pages at [facility] for pain management  She has had  lumbar surgery x 2 by Dr. Hal Neer, last one April 2013.   MRi 10/2011 (between the two operations) had shown spinal stenosis at L4L5.    ESI x 2 did not help.    Also she is Cushingoid.    Update 09/10/2017: She was recently diagnosed with mixed connective tissue disorder by Dr. Estanislado Pandy. The ANA has been positive and the ribonucleic protein (ANA) antibody is positive.   Plaquenil was started about 4 months ago and her hand pain and joint pain is less constant.   She may be starting methotrexate soon.   She also has MGUS with a IgG Kappa paraprotein.    Also of note she has a dry mouth  She has foot pain bilaterally that bothers her more when she is at rest or in bed.   Moving will help some.    Lidocaine ointment and wearing socks/footies also help   Horizant also helps the pain/RLS.  She has a h/o OSA.   She uses her old CPAP and she notes she does not snore when she uses it (according to her husband) but she does not feel her sleepiness is any better.       From 01/15/2017: Joint pain:   She is noting more generalized joint pain.  This has worsened over the past year.  Pain is  worse in the wrists and fingers and ankles .  Bending her fingers is painful.   She sees Dr. Gerilyn Nestle but no further recommendations were made.   She had a carpal tunnel injection without benefit.     She has been referred to Dr Rochele Pages at [facility] (Pain management) for additional services.  Dilaudid had been prescribed to her previously.     She reports being diagnosed with Sjogren's in the past.   A recent ANA was 1:160 (speckled and homogenous) and her CRP is also elevated.     Polyneuropathy/restless legs:   She reports a burning dysesthetic pain and RLS.   Pain is stabbing in quality. Pain is worse when she is not moving.   Shifting around helps.      Horizant with Lidocaine ointment has helped a lot for neruopathic pain and RLS     She tolerates her med's well    Etiology is likely related to either IDDM (dx in 1991; IDDM  since 2008) or MGUS.    She had an elevated M-spike on SPEP and is seeing Dr. Baird Cancer in [facility].    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    Bone survey was normal.   She has a family history of Multiple Myeloma  OSA/Insomnia:   She has OSA and used to use CPAP.   She stopped for a bad cold last year and had trouble resuming so has not restarted.   She falls asleep easy most nights.  However some nights she is up many hours.   She awakens 3-4 times most nights to urinate.   She often has trouble falling back asleep.    Insomnia initially did better with imipramine but is now not doing as well.     She is sleepy during the day and takes naps.  EPWORTH SLEEPINESS SCALE  On a scale of 0 - 3 what is the chance of dozing:  Sitting and Reading:   3 Watching TV:    1 Sitting inactive in a public place: 0 Passenger in car for one hour: 3 Lying down to rest in the afternoon: 3 Sitting and talking to someone: 0 Sitting quietly after lunch:  1 In a car, stopped in traffic:  0  Total (out of 24):   11/24   Fatigue:   She had low cortisol and she had an ACTH stim test recently which was reportedly blunted but not abnormal.     Fibromyalgia:  Besides joint pain she also has mylagias. She also has dperession and poor sleep.   .  On her med's pain is usually mild but sometimes intensify.   Her worse pain is in the calves and inner thighs.   Current joint pain is worse in the hands.     She has not needed as much hydrocodone.     She is currently on Horizant, imipramine and meloxicam.     LBP/Neck pain:   More recently, she has had neck pain and wrist pain.   A steroid pack hellped a lot but NSAIDs have not.  She has axial back pain and occasional left leg pain.  She has had lumbar surgery by Dr. Kennon Holter (Neurosurgery) in the past.     She sees Delrae Alfred, PT for physical therapy and feels it might help.    CTS:   She has had bilateral CTR over the past year.     Weight Loss:   Last year, we started phentermine  and she lost 15  pounds but regained it back when placed on prednisone.   She stopped before her surgery.   She tolerates it well.    Anxiety/stress:   She gets anxious an notes more stress (her mother in law has cancer and she and her husband travel there a lot).     REVIEW OF SYSTEMS: Constitutional: No fevers, chills, sweats, or change in appetite.  Notes fatigue and sleepiness Eyes: No visual changes, double vision, eye pain Ear, nose and throat: No hearing loss, ear pain, nasal congestion, sore throat Cardiovascular: No chest pain, palpitations Respiratory: No shortness of breath at rest or with exertion.   No wheezes.  Has OSA GastrointestinaI: No nausea, vomiting, diarrhea, abdominal pain, fecal incontinence Genitourinary: Notes urgency and rare incontiennce Musculoskeletal: as above Integumentary: No rash, pruritus, skin lesions Neurological: as above Psychiatric: Notes depression and some anxiety Endocrine: No palpitations, diaphoresis, change in appetite, change in weigh or increased thirst Hematologic/Lymphatic: No anemia, purpura, petechiae. Allergic/Immunologic: No itchy/runny eyes, nasal congestion, recent allergic reactions, rashes  ALLERGIES: Allergies  Allergen Reactions  . Codeine Nausea And Vomiting  . Atorvastatin     She cannot remember reaction  . Methotrexate Nausea And Vomiting    Over sedation  . Methotrexate Derivatives Nausea And Vomiting    Over sedation  . Quinapril Hcl Cough    HOME MEDICATIONS:  Current Outpatient Medications:  .  ACCU-CHEK FASTCLIX LANCETS MISC, , Disp: , Rfl:  .  APPLE CIDER VINEGAR PO, Take 400 mg by mouth 3 (three) times daily. , Disp: , Rfl:  .  aspirin-acetaminophen-caffeine (EXCEDRIN MIGRAINE) 250-250-65 MG tablet, Take 3 tablets by mouth daily as needed for headache., Disp: , Rfl:  .  buPROPion (WELLBUTRIN XL) 150 MG 24 hr tablet, Take 150 mg by mouth daily., Disp: , Rfl:  .  Calcium Carb-Cholecalciferol (CALCIUM 1000  + D PO), Take 1 tablet by mouth daily. , Disp: , Rfl:  .  calcium carbonate (TUMS - DOSED IN MG ELEMENTAL CALCIUM) 500 MG chewable tablet, Chew 3 tablets by mouth daily as needed for indigestion or heartburn., Disp: , Rfl:  .  cetirizine (ZYRTEC) 10 MG tablet, Take 10 mg by mouth daily. , Disp: , Rfl:  .  diclofenac sodium (VOLTAREN) 1 % GEL, Apply 1 application topically daily as needed (knee / hand pain). , Disp: , Rfl:  .  dimenhyDRINATE (DRAMAMINE) 50 MG tablet, Take 50 mg by mouth daily as needed for dizziness., Disp: , Rfl:  .  diphenhydramine-acetaminophen (TYLENOL PM) 25-500 MG TABS tablet, Take 3 tablets by mouth at bedtime as needed (pain/sleep)., Disp: , Rfl:  .  empagliflozin (JARDIANCE) 25 MG TABS tablet, Take 25 mg by mouth daily. , Disp: , Rfl:  .  folic acid (FOLVITE) 1 MG tablet, TAKE 2 TABLETS(2 MG) BY MOUTH DAILY (Patient taking differently: Take 1 mg by mouth daily. ), Disp: 180 tablet, Rfl: 3 .  FREESTYLE LITE test strip, daily., Disp: , Rfl:  .  furosemide (LASIX) 40 MG tablet, Take 40 mg by mouth daily., Disp: , Rfl:  .  HORIZANT 600 MG TBCR, Take 1 tablet (600 mg total) by mouth 2 (two) times daily., Disp: 180 tablet, Rfl: 3 .  HYDROcodone-acetaminophen (NORCO) 10-325 MG tablet, Take 1 tablet by mouth every 4 (four) hours as needed for moderate pain., Disp: 30 tablet, Rfl: 0 .  HYDROcodone-acetaminophen (NORCO/VICODIN) 5-325 MG tablet, Take 1 tablet by mouth every 4 (four) hours as needed for moderate pain., Disp: , Rfl:  .  hydroxychloroquine (PLAQUENIL) 200 MG tablet, TAKE 1 TABLET TWICE A DAY (FOR MIXED CONNECTIVE TISSUE DISEASE), Disp: 180 tablet, Rfl: 0 .  Insulin Regular Human (HUMULIN R U-500, CONCENTRATED, [place]), Inject 0.05-0.25 mLs into the skin 3 (three) times daily as needed (for high blood sugar). , Disp: , Rfl:  .  Insulin Syringe-Needle U-100 (BD INSULIN SYRINGE U/F) 31G X 5/16" 0.5 ML MISC, USE TO INJECT UP TO THREE TIMES A DAY, Disp: , Rfl:  .  lidocaine  (XYLOCAINE) 5 % ointment, APPLY 1 APPLICATION TOPICALLY AS NEEDED, Disp: 50 g, Rfl: 11 .  LORazepam (ATIVAN) 1 MG tablet, Take 3 mg by mouth at bedtime. , Disp: , Rfl: 5 .  losartan (COZAAR) 50 MG tablet, Take 50 mg by mouth daily after supper. , Disp: , Rfl:  .  pantoprazole (PROTONIX) 40 MG tablet, Take 1 tablet (40 mg total) by mouth daily., Disp: 30 tablet, Rfl: 0 .  Polyethyl Glycol-Propyl Glycol (SYSTANE OP), Place 1 drop into both eyes at bedtime as needed (dry eyes)., Disp: , Rfl:  .  promethazine (PHENERGAN) 12.5 MG tablet, Take 1 tablet (12.5 mg total) by mouth every 6 (six) hours as needed for nausea or vomiting., Disp: 30 tablet, Rfl: 0 .  Semaglutide, 1 MG/DOSE, (OZEMPIC, 1 MG/DOSE,) 2 MG/1.5ML SOPN, Inject 1 mg into the skin every Thursday., Disp: , Rfl:  .  thyroid (ARMOUR) 240 MG tablet, Take 240 mg by mouth daily. , Disp: , Rfl:  .  tamsulosin (FLOMAX) 0.4 MG CAPS capsule, Take 1 capsule (0.4 mg total) by mouth daily., Disp: 30 capsule, Rfl: 11  PAST MEDICAL HISTORY: Past Medical History:  Diagnosis Date  . Anxiety   . Arthritis   . 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 Diabetes mellitus    Endocrinologist Dr. Iran Planas  . Difficult intubation    2011 scratched trachea  . Dyspnea    upon exertion  . Fibromyalgia   . GERD (gastroesophageal reflux disease)   . Headache(784.0)   . Hypertension   . Hypothyroidism   . Lumbar disc disease   . MGUS (monoclonal gammopathy of unknown significance)   . Mixed connective tissue disease ([place])   . Neuropathy   . Pneumonia   . Sjogren's disease ([place])   . Sleep apnea    no cpap.sleep study 2006 [facility]    PAST SURGICAL HISTORY: Past Surgical History:  Procedure Laterality Date  . APPENDECTOMY    . BACK SURGERY     lumbar fusion  . BRAIN SURGERY     For cushings  . CARPAL TUNNEL RELEASE Bilateral   . CESAREAN SECTION    . COLONOSCOPY    . COMBINED HYSTEROSCOPY DIAGNOSTIC / D&C    .  ENDOMETRIAL ABLATION    . ESOPHAGOGASTRODUODENOSCOPY ENDOSCOPY    . FRACTURE SURGERY     l arm  . LUMBAR LAMINECTOMY/DECOMPRESSION MICRODISCECTOMY  11/13/2011   Procedure: LUMBAR LAMINECTOMY/DECOMPRESSION MICRODISCECTOMY;  Surgeon: Faythe Ghee, MD;  Location: [facility];  Service: Neurosurgery;  Laterality: Bilateral;  Bilateral Lumbar four-five Decompression  . TONSILLECTOMY    . TRANSPHENOIDAL / TRANSNASAL HYPOPHYSECTOMY / RESECTION PITUITARY TUMOR    . TRIGGER FINGER RELEASE    . TUBAL LIGATION      FAMILY HISTORY: Family History  Problem Relation Age of Onset  . Diabetes type II Mother   . Diabetes type II Father   . Heart disease Father   . Heart attack Father   .  Colon cancer Maternal Grandmother   . Heart disease Maternal Grandfather     SOCIAL HISTORY:  Social History   Socioeconomic History  . Marital status: Married    Spouse name: Not on file  . Number of children: Not on file  . Years of education: Not on file  . Highest education level: Not on file  Occupational History  . Not on file  Tobacco Use  . Smoking status: Former Smoker    Packs/day: 1.00    Years: 32.00    Pack years: 32.00    Types: Cigarettes    Quit date: 07/27/2006    Years since quitting: 13.1  . Smokeless tobacco: Never Used  Substance and Sexual Activity  . Alcohol use: Yes    Comment: rarely  . Drug use: No  . Sexual activity: Not on file  Other Topics Concern  . Not on file  Social History Narrative  . Not on file   Social Determinants of Health   Financial Resource Strain:   . Difficulty of Paying Living Expenses: Not on file  Food Insecurity:   . Worried About Charity fundraiser in the Last Year: Not on file  . Ran Out of Food in the Last Year: Not on file  Transportation Needs:   . Lack of Transportation (Medical): Not on file  . Lack of Transportation (Non-Medical): Not on file  Physical Activity:   . Days of Exercise per Week: Not on file  . Minutes of  Exercise per Session: Not on file  Stress:   . Feeling of Stress : Not on file  Social Connections:   . Frequency of Communication with Friends and Family: Not on file  . Frequency of Social Gatherings with Friends and Family: Not on file  . Attends Religious Services: Not on file  . Active Member of Clubs or Organizations: Not on file  . Attends Archivist Meetings: Not on file  . Marital Status: Not on file  Intimate Partner Violence:   . Fear of Current or Ex-Partner: Not on file  . Emotionally Abused: Not on file  . Physically Abused: Not on file  . Sexually Abused: Not on file     PHYSICAL EXAM  Vitals:   09/16/19 1523  BP: 130/61  Pulse: 78  Temp: (!) 97 F (36.1 C)  Weight: 243 lb (110.2 kg)  Height: 5' 3"  (1.6 m)    Body mass index is 43.05 kg/m.   General: The patient is an obese woman in NAD.      Skin: Extremities are without rash or edema.  Musculoskeletal: She has some tenderness over the classic fibromyalgia tender points of the upper chest and back.  Mild joint enlargement in the hands     Neurologic Exam  Mental status: The patient is alert and oriented x 3 at the time of the examination. The patient has apparent normal recent and remote memory, with an apparently normal attention span and concentration ability.   Speech is normal.  Cranial nerves: Extraocular movements are full.   There is good facial sensation to soft touch bilaterally.Facial strength is normal.  Trapezius and sternocleidomastoid strength is normal. No dysarthria is noted.  Hearing is normal.  Motor:  Muscle bulk is normal.   Tone is normal. Strength is  5 / 5 in all 4 extremities.   Sensory: She has intact sensation to touch and vibration in the arms and hands.  She has reduced vibration sensation at the toes.  There is also dysesthesia over the left L5 distribution and decreased touch sensation in both feet.  Coordination: Cerebellar testing reveals good  finger-nose-finger and heel-to-shin  Gait and station: Station is normal.   Gait is normal.  Tandem gait is mildly wide.  Romberg is negative.   Reflexes: Deep tendon reflexes are symmetric and 1+ bilaterally.        DIAGNOSTIC DATA (LABS, IMAGING, TESTING) - I reviewed patient records, labs, notes, testing and imaging myself where available.  Lab Results  Component Value Date   WBC 10.3 05/20/2019   HGB 11.4 (L) 05/20/2019   HCT 37.3 05/20/2019   MCV 91.2 05/20/2019   PLT 159 05/20/2019      Component Value Date/Time   NA 136 05/20/2019 0128   K 4.3 05/20/2019 0128   CL 100 05/20/2019 0128   CO2 25 05/20/2019 0128   GLUCOSE 259 (H) 05/20/2019 0128   BUN 20 05/20/2019 0128   CREATININE 1.13 (H) 05/20/2019 0128   CREATININE 0.68 01/07/2019 1454   CALCIUM 8.6 (L) 05/20/2019 0128   PROT 7.5 05/20/2019 0128   ALBUMIN 3.7 05/20/2019 0128   AST 32 05/20/2019 0128   ALT 27 05/20/2019 0128   ALKPHOS 65 05/20/2019 0128   BILITOT 0.9 05/20/2019 0128   GFRNONAA 52 (L) 05/20/2019 0128   GFRNONAA 94 01/07/2019 1454   GFRAA >60 05/20/2019 0128   GFRAA 109 01/07/2019 1454      ASSESSMENT AND PLAN  History of lumbar surgery  DDD (degenerative disc disease), lumbar  MGUS (monoclonal gammopathy of unknown significance) - Plan: Multiple Myeloma Panel (SPEP&IFE w/QIG), Kappa/Lambda Light Chains, Free, With Ratio, 24Hr. Urine  Neuropathy - Plan: Multiple Myeloma Panel (SPEP&IFE w/QIG), Kappa/Lambda Light Chains, Free, With Ratio, 24Hr. Urine    1.    Continue Horizant and lidocaine ointment for the polyneuropathy.    Check SPEP/IEF and free light chains 2.   Flomax for bladder 3.    She is advised to stay active and exercises as tolerated.  4.   RTC 12 months, sooner if she has new or worsening neurologic symptoms.   Yarisbel Miranda A. Felecia Shelling, MD, PhD 03/15/3824, 0:53 PM Certified in Neurology, [place] [facility], Sleep Medicine, Pain Medicine and Neuroimaging  [facility] [address] [place], [place] [address] [phone]  7

## 2019-09-21 LAB — KAPPA/LAMBDA LIGHT CHAINS, FREE, WITH RATIO, 24HR. URINE
FR KAPPA LT CH,24HR: 0.8 mg/24 hr
FR LAMBDA LT CH,24HR: 0.05 mg/24 hr
Free Kappa Lt Chains,Ur: 13.33 mg/L (ref 0.63–113.79)
Free Lambda Lt Chains,Ur: 0.86 mg/L (ref 0.47–11.77)
Kappa/Lambda Ratio,U: 15.5 (ref 1.03–31.76)

## 2019-09-21 LAB — MULTIPLE MYELOMA PANEL, SERUM
Albumin SerPl Elph-Mcnc: 3.8 g/dL (ref 2.9–4.4)
Albumin/Glob SerPl: 1.1 (ref 0.7–1.7)
Alpha 1: 0.2 g/dL (ref 0.0–0.4)
Alpha2 Glob SerPl Elph-Mcnc: 0.9 g/dL (ref 0.4–1.0)
B-Globulin SerPl Elph-Mcnc: 1.1 g/dL (ref 0.7–1.3)
Gamma Glob SerPl Elph-Mcnc: 1.5 g/dL (ref 0.4–1.8)
Globulin, Total: 3.7 g/dL (ref 2.2–3.9)
IgA/Immunoglobulin A, Serum: 338 mg/dL (ref 87–352)
IgG (Immunoglobin G), Serum: 1432 mg/dL (ref 586–1602)
IgM (Immunoglobulin M), Srm: 172 mg/dL (ref 26–217)
Total Protein: 7.5 g/dL (ref 6.0–8.5)

## 2020-01-01 ENCOUNTER — Other Ambulatory Visit: Payer: Self-pay | Admitting: Gastroenterology

## 2020-01-12 ENCOUNTER — Other Ambulatory Visit (HOSPITAL_COMMUNITY)
Admission: RE | Admit: 2020-01-12 | Discharge: 2020-01-12 | Disposition: A | Discharge status: Home / Self Care | Source: Ambulatory Visit | Attending: Gastroenterology | Admitting: Gastroenterology

## 2020-01-12 ENCOUNTER — Other Ambulatory Visit: Payer: Self-pay | Admitting: Neurology

## 2020-01-12 DIAGNOSIS — Z20822 Contact with and (suspected) exposure to covid-19: Secondary | ICD-10-CM | POA: Insufficient documentation

## 2020-01-12 DIAGNOSIS — Z01812 Encounter for preprocedural laboratory examination: Secondary | ICD-10-CM | POA: Insufficient documentation

## 2020-01-12 NOTE — Progress Notes (Signed)
AT preprocedure phone call determined that patient is on U-500 Insuilin.  Instructed pt to call her endocrinologist, Dr Iran Planas for preprocedure instructions regarding U-500 Insuilin.  Patient voiced understanding.

## 2020-01-13 LAB — SARS CORONAVIRUS 2 (TAT 6-24 HRS): SARS Coronavirus 2: NEGATIVE

## 2020-01-15 ENCOUNTER — Ambulatory Visit (HOSPITAL_COMMUNITY): Admitting: Certified Registered"

## 2020-01-15 ENCOUNTER — Other Ambulatory Visit: Payer: Self-pay

## 2020-01-15 ENCOUNTER — Encounter (HOSPITAL_COMMUNITY): Payer: Self-pay | Admitting: Gastroenterology

## 2020-01-15 ENCOUNTER — Encounter (HOSPITAL_COMMUNITY)
Admission: RE | Disposition: A | Discharge status: Home / Self Care | Payer: Self-pay | Source: Home / Self Care | Attending: Gastroenterology

## 2020-01-15 ENCOUNTER — Ambulatory Visit (HOSPITAL_COMMUNITY)
Admission: RE | Admit: 2020-01-15 | Discharge: 2020-01-15 | Disposition: A | Discharge status: Home / Self Care | Attending: Gastroenterology | Admitting: Gastroenterology

## 2020-01-15 DIAGNOSIS — F329 Major depressive disorder, single episode, unspecified: Secondary | ICD-10-CM | POA: Insufficient documentation

## 2020-01-15 DIAGNOSIS — Z8249 Family history of ischemic heart disease and other diseases of the circulatory system: Secondary | ICD-10-CM | POA: Insufficient documentation

## 2020-01-15 DIAGNOSIS — M1990 Unspecified osteoarthritis, unspecified site: Secondary | ICD-10-CM | POA: Insufficient documentation

## 2020-01-15 DIAGNOSIS — D122 Benign neoplasm of ascending colon: Secondary | ICD-10-CM | POA: Insufficient documentation

## 2020-01-15 DIAGNOSIS — Z87891 Personal history of nicotine dependence: Secondary | ICD-10-CM | POA: Diagnosis not present

## 2020-01-15 DIAGNOSIS — E039 Hypothyroidism, unspecified: Secondary | ICD-10-CM | POA: Diagnosis not present

## 2020-01-15 DIAGNOSIS — G4730 Sleep apnea, unspecified: Secondary | ICD-10-CM | POA: Insufficient documentation

## 2020-01-15 DIAGNOSIS — M3500 Sicca syndrome, unspecified: Secondary | ICD-10-CM | POA: Insufficient documentation

## 2020-01-15 DIAGNOSIS — K219 Gastro-esophageal reflux disease without esophagitis: Secondary | ICD-10-CM | POA: Insufficient documentation

## 2020-01-15 DIAGNOSIS — F419 Anxiety disorder, unspecified: Secondary | ICD-10-CM | POA: Diagnosis not present

## 2020-01-15 DIAGNOSIS — R519 Headache, unspecified: Secondary | ICD-10-CM | POA: Insufficient documentation

## 2020-01-15 DIAGNOSIS — K2950 Unspecified chronic gastritis without bleeding: Secondary | ICD-10-CM | POA: Insufficient documentation

## 2020-01-15 DIAGNOSIS — Z9049 Acquired absence of other specified parts of digestive tract: Secondary | ICD-10-CM | POA: Diagnosis not present

## 2020-01-15 DIAGNOSIS — M797 Fibromyalgia: Secondary | ICD-10-CM | POA: Diagnosis not present

## 2020-01-15 DIAGNOSIS — Z888 Allergy status to other drugs, medicaments and biological substances status: Secondary | ICD-10-CM | POA: Insufficient documentation

## 2020-01-15 DIAGNOSIS — G709 Myoneural disorder, unspecified: Secondary | ICD-10-CM | POA: Diagnosis not present

## 2020-01-15 DIAGNOSIS — Z800 Family history of malignant neoplasm of digestive organs: Secondary | ICD-10-CM | POA: Insufficient documentation

## 2020-01-15 DIAGNOSIS — E1140 Type 2 diabetes mellitus with diabetic neuropathy, unspecified: Secondary | ICD-10-CM | POA: Insufficient documentation

## 2020-01-15 DIAGNOSIS — Z833 Family history of diabetes mellitus: Secondary | ICD-10-CM | POA: Diagnosis not present

## 2020-01-15 DIAGNOSIS — Z1211 Encounter for screening for malignant neoplasm of colon: Secondary | ICD-10-CM | POA: Insufficient documentation

## 2020-01-15 DIAGNOSIS — D123 Benign neoplasm of transverse colon: Secondary | ICD-10-CM | POA: Diagnosis not present

## 2020-01-15 DIAGNOSIS — R0600 Dyspnea, unspecified: Secondary | ICD-10-CM | POA: Diagnosis not present

## 2020-01-15 DIAGNOSIS — Z86010 Personal history of colonic polyps: Secondary | ICD-10-CM | POA: Diagnosis not present

## 2020-01-15 DIAGNOSIS — I10 Essential (primary) hypertension: Secondary | ICD-10-CM | POA: Diagnosis not present

## 2020-01-15 DIAGNOSIS — Z885 Allergy status to narcotic agent status: Secondary | ICD-10-CM | POA: Insufficient documentation

## 2020-01-15 DIAGNOSIS — Z7984 Long term (current) use of oral hypoglycemic drugs: Secondary | ICD-10-CM | POA: Insufficient documentation

## 2020-01-15 DIAGNOSIS — D472 Monoclonal gammopathy: Secondary | ICD-10-CM | POA: Diagnosis not present

## 2020-01-15 HISTORY — PX: POLYPECTOMY: SHX5525

## 2020-01-15 HISTORY — PX: COLONOSCOPY WITH PROPOFOL: SHX5780

## 2020-01-15 HISTORY — PX: BIOPSY: SHX5522

## 2020-01-15 HISTORY — PX: ESOPHAGOGASTRODUODENOSCOPY (EGD) WITH PROPOFOL: SHX5813

## 2020-01-15 LAB — GLUCOSE, CAPILLARY
Glucose-Capillary: 92 mg/dL (ref 70–99)
Glucose-Capillary: 84 mg/dL (ref 70–99)

## 2020-01-15 SURGERY — ESOPHAGOGASTRODUODENOSCOPY (EGD) WITH PROPOFOL
Anesthesia: Monitor Anesthesia Care

## 2020-01-15 MED ORDER — PROPOFOL 500 MG/50ML IV EMUL
INTRAVENOUS | Status: AC
  Filled 2020-01-15: qty 50

## 2020-01-15 MED ORDER — LACTATED RINGERS IV SOLN
INTRAVENOUS | Status: AC | PRN
  Administered 2020-01-15: 1000 mL via INTRAVENOUS

## 2020-01-15 MED ORDER — PROPOFOL 500 MG/50ML IV EMUL
INTRAVENOUS | Status: DC | PRN
  Administered 2020-01-15: 150 ug/kg/min via INTRAVENOUS
  Administered 2020-01-15: 180 ug/kg/min via INTRAVENOUS

## 2020-01-15 MED ORDER — SODIUM CHLORIDE 0.9 % IV SOLN: INTRAVENOUS | Status: DC

## 2020-01-15 MED ORDER — LIDOCAINE 2% (20 MG/ML) 5 ML SYRINGE
INTRAMUSCULAR | Status: DC | PRN
  Administered 2020-01-15: 100 mg via INTRAVENOUS

## 2020-01-15 MED ORDER — PROPOFOL 10 MG/ML IV BOLUS
INTRAVENOUS | Status: DC | PRN
  Administered 2020-01-15: 10 mg via INTRAVENOUS
  Administered 2020-01-15 (×2): 20 mg via INTRAVENOUS

## 2020-01-15 MED ORDER — PROPOFOL 10 MG/ML IV BOLUS
INTRAVENOUS | Status: AC
  Filled 2020-01-15: qty 20

## 2020-01-15 SURGICAL SUPPLY — 25 items

## 2020-01-15 NOTE — Op Note (Signed)
[facility] Patient Name: Gloria Porter Procedure Date: 01/15/2020 MRN: [id number] Attending MD: Carol Ada , MD Date of Birth: [birth year] CSN: [id number] Age: 64 Admit Type: Outpatient Procedure:                Upper GI endoscopy Indications:              Nausea Providers:                Carol Ada, MD, Cleda Daub, RN, Laverda Sorenson,                            Technician, Jefm Miles CRNA Referring MD:              Medicines:                Propofol per Anesthesia Complications:            No immediate complications. Estimated Blood Loss:     Estimated blood loss: none. Procedure:                Pre-Anesthesia Assessment:                           - Prior to the procedure, a History and Physical                            was performed, and patient medications and                            allergies were reviewed. The patient's tolerance of                            previous anesthesia was also reviewed. The risks                            and benefits of the procedure and the sedation                            options and risks were discussed with the patient.                            All questions were answered, and informed consent                            was obtained. Prior Anticoagulants: The patient has                            taken no previous anticoagulant or antiplatelet                            agents. ASA Grade Assessment: III - A patient with                            severe systemic disease. After reviewing the risks  and benefits, the patient was deemed in                            satisfactory condition to undergo the procedure.                           - Sedation was administered by an anesthesia                            professional. Deep sedation was attained.                           After obtaining informed consent, the endoscope was                            passed under direct vision.  Throughout the                            procedure, the patient's blood pressure, pulse, and                            oxygen saturations were monitored continuously. The                            PCF-H190DL ([id number] Olympus pediatric colonscope                            was introduced through the mouth, and advanced to                            the second part of duodenum. The upper GI endoscopy                            was accomplished without difficulty. The patient                            tolerated the procedure well. Scope In: Scope Out: Findings:      The esophagus was normal.      Localized moderate inflammation characterized by erythema and       granularity was found in the gastric fundus. Biopsies were taken with a       cold forceps for histology.      Clear fluid was found in the gastric body. There was a moderate to large       amount of retained fluid. The fluid was suctioned.      The examined duodenum was normal. Impression:               - Normal esophagus.                           - Gastritis. Biopsied.                           - Clear gastric fluid.                           -  Normal examined duodenum. Moderate Sedation:      Not Applicable - Patient had care per Anesthesia. Recommendation:           - Patient has a contact number available for                            emergencies. The signs and symptoms of potential                            delayed complications were discussed with the                            patient. Return to normal activities tomorrow.                            Written discharge instructions were provided to the                            patient.                           - Resume previous diet.                           - Continue present medications.                           - Await pathology results.                           - Return to GI clinic in 4 weeks.                           - ? GES. Procedure Code(s):         --- Professional ---                           [phone], Esophagogastroduodenoscopy, flexible,                            transoral; with biopsy, single or multiple Diagnosis Code(s):        --- Professional ---                           K29.70, Gastritis, unspecified, without bleeding                           R11.0, Nausea CPT copyright 2019 American Medical Association. All rights reserved. The codes documented in this report are preliminary and upon coder review may  be revised to meet current compliance requirements. Carol Ada, MD Carol Ada, MD 01/15/2020 10:37:44 AM This report has been signed electronically. Number of Addenda: 0

## 2020-01-15 NOTE — Anesthesia Preprocedure Evaluation (Addendum)
Anesthesia Evaluation  Patient identified by MRN, date of birth, ID band Patient awake    Reviewed: Allergy & Precautions, NPO status , Patient's Chart, lab work & pertinent test results  Airway Mallampati: II  TM Distance: <3 FB Neck ROM: Full    Dental  (+) Teeth Intact, Dental Advisory Given   Pulmonary sleep apnea , former smoker,    breath sounds clear to auscultation       Cardiovascular hypertension,  Rhythm:Regular Rate:Normal     Neuro/Psych  Headaches, PSYCHIATRIC DISORDERS Anxiety Depression  Neuromuscular disease    GI/Hepatic Neg liver ROS, GERD  ,  Endo/Other  diabetes, Type 2, Oral Hypoglycemic AgentsHypothyroidism   Renal/GU negative Renal ROS     Musculoskeletal   Abdominal Normal abdominal exam  (+)   Peds  Hematology   Anesthesia Other Findings   Reproductive/Obstetrics                            Anesthesia Physical Anesthesia Plan  ASA: III  Anesthesia Plan: MAC   Post-op Pain Management:    Induction: Intravenous  PONV Risk Score and Plan: 0 and Propofol infusion and Treatment may vary due to age or medical condition  Airway Management Planned: Natural Airway and Simple Face Mask  Additional Equipment: None  Intra-op Plan:   Post-operative Plan:   Informed Consent: I have reviewed the patients History and Physical, chart, labs and discussed the procedure including the risks, benefits and alternatives for the proposed anesthesia with the patient or authorized representative who has indicated his/her understanding and acceptance.     Dental advisory given  Plan Discussed with: CRNA  Anesthesia Plan Comments:        Anesthesia Quick Evaluation

## 2020-01-15 NOTE — Anesthesia Procedure Notes (Signed)
Procedure Name: MAC Date/Time: 01/15/2020 9:54 AM Performed by: Eben Burow, CRNA Pre-anesthesia Checklist: Patient identified, Emergency Drugs available, Suction available, Patient being monitored and Timeout performed Oxygen Delivery Method: Simple face mask Dental Injury: Teeth and Oropharynx as per pre-operative assessment

## 2020-01-15 NOTE — Anesthesia Postprocedure Evaluation (Signed)
Anesthesia Post Note  Patient: TISHARA PIZANO  Procedure(s) Performed: ESOPHAGOGASTRODUODENOSCOPY (EGD) WITH PROPOFOL (N/A ) COLONOSCOPY WITH PROPOFOL (N/A ) BIOPSY POLYPECTOMY     Patient location during evaluation: PACU Anesthesia Type: MAC Level of consciousness: awake and alert Pain management: pain level controlled Vital Signs Assessment: post-procedure vital signs reviewed and stable Respiratory status: spontaneous breathing, nonlabored ventilation, respiratory function stable and patient connected to nasal cannula oxygen Cardiovascular status: stable and blood pressure returned to baseline Postop Assessment: no apparent nausea or vomiting Anesthetic complications: no    Last Vitals:  Vitals:   01/15/20 1040 01/15/20 1050  BP: (!) 153/64 (!) 171/67  Pulse: 72 74  Resp: 14 15  Temp: 36.5 C   SpO2: 100% 99%    Last Pain:  Vitals:   01/15/20 1050  TempSrc:   PainSc: 0-No pain                 Effie Berkshire

## 2020-01-15 NOTE — Transfer of Care (Signed)
Immediate Anesthesia Transfer of Care Note  Patient: Gloria Porter  Procedure(s) Performed: ESOPHAGOGASTRODUODENOSCOPY (EGD) WITH PROPOFOL (N/A ) COLONOSCOPY WITH PROPOFOL (N/A ) BIOPSY POLYPECTOMY  Patient Location: PACU and Endoscopy Unit  Anesthesia Type:MAC  Level of Consciousness: awake, alert , oriented and patient cooperative  Airway & Oxygen Therapy: Patient Spontanous Breathing and Patient connected to face mask oxygen  Post-op Assessment: Report given to RN and Post -op Vital signs reviewed and stable  Post vital signs: Reviewed and stable  Last Vitals:  Vitals Value Taken Time  BP    Temp    Pulse    Resp    SpO2      Last Pain:  Vitals:   01/15/20 0853  TempSrc: Oral  PainSc: 0-No pain         Complications: No apparent anesthesia complications

## 2020-01-15 NOTE — H&P (Signed)
Gloria Porter HPI: The patient's colonoscopy on 02/01/2015 was positive for a small tubular adenoma. She is s/p lap chole as she was suffering with nausea, vomiting, upper abdominal pain, and back pain. Her pain resolved, but she continues to have nausea and it is relieved with self-inducing vomiting. Her latest A1C is in the 7% range and it was higher in the past. Her EGD in 2016 was normal and it was performed for complaints of dysphagia.    Past Medical History:  Diagnosis Date  . Anxiety   . Arthritis   . 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 Diabetes mellitus    Endocrinologist Dr. Iran Planas  . Difficult intubation    2011 scratched trachea  . Dyspnea    upon exertion  . Fibromyalgia   . GERD (gastroesophageal reflux disease)   . Headache(784.0)   . Hypertension   . Hypothyroidism   . Lumbar disc disease   . MGUS (monoclonal gammopathy of unknown significance)   . Mixed connective tissue disease ([place])   . Neuropathy   . Pneumonia   . Sjogren's disease ([place])   . Sleep apnea    no cpap.sleep study 2006 [place]    Past Surgical History:  Procedure Laterality Date  . APPENDECTOMY    . BACK SURGERY     lumbar fusion  . BRAIN SURGERY     For cushings  . CARPAL TUNNEL RELEASE Bilateral   . CESAREAN SECTION    . COLONOSCOPY    . COMBINED HYSTEROSCOPY DIAGNOSTIC / D&C    . ENDOMETRIAL ABLATION    . ESOPHAGOGASTRODUODENOSCOPY ENDOSCOPY    . FRACTURE SURGERY     l arm  . LUMBAR LAMINECTOMY/DECOMPRESSION MICRODISCECTOMY  11/13/2011   Procedure: LUMBAR LAMINECTOMY/DECOMPRESSION MICRODISCECTOMY;  Surgeon: Faythe Ghee, MD;  Location: [place] [facility];  Service: Neurosurgery;  Laterality: Bilateral;  Bilateral Lumbar four-five Decompression  . TONSILLECTOMY    . TRANSPHENOIDAL / TRANSNASAL HYPOPHYSECTOMY / RESECTION PITUITARY TUMOR    . TRIGGER FINGER RELEASE    . TUBAL LIGATION      Family History  Problem Relation Age of Onset  .  Diabetes type II Mother   . Diabetes type II Father   . Heart disease Father   . Heart attack Father   . Colon cancer Maternal Grandmother   . Heart disease Maternal Grandfather     Social History:  reports that she quit smoking about 13 years ago. Her smoking use included cigarettes. She has a 32.00 pack-year smoking history. She has never used smokeless tobacco. She reports current alcohol use. She reports that she does not use drugs.  Allergies:  Allergies  Allergen Reactions  . Codeine Nausea And Vomiting  . Atorvastatin     She cannot remember reaction  . Methotrexate Nausea And Vomiting    Over sedation  . Methotrexate Derivatives Nausea And Vomiting    Over sedation  . Quinapril Hcl Cough    Medications:  Scheduled:  Continuous: . sodium chloride    . lactated ringers 1,000 mL (01/15/20 0905)    Results for orders placed or performed during the hospital encounter of 01/15/20 (from the past 24 hour(s))  Glucose, capillary     Status: None   Collection Time: 01/15/20  8:53 AM  Result Value Ref Range   Glucose-Capillary 92 70 - 99 mg/dL     No results found.  ROS:  As stated above in the HPI otherwise negative.  Blood pressure Marland Kitchen)  140/34, temperature 98.1 F (36.7 C), temperature source Oral, resp. rate (!) 25, height 5\' 3"  (1.6 m), weight 113.4 kg, last menstrual period 11/08/2006, SpO2 98 %.    PE: Gen: NAD, Alert and Oriented HEENT:  [place]/AT, EOMI Neck: Supple, no LAD Lungs: CTA Bilaterally CV: RRR without M/G/R ABD: Soft, NTND, +BS Ext: No C/C/E  Assessment/Plan: 1) Nausea. 2) Personal history of polyps.  Plan: 1) EGD/colonoscopy.  Gloria Porter D 01/15/2020, 9:37 AM

## 2020-01-15 NOTE — Discharge Instructions (Signed)
YOU HAD AN ENDOSCOPIC PROCEDURE TODAY: Refer to the procedure report and other information in the discharge instructions given to you for any specific questions about what was found during the examination. If this information does not answer your questions, please call [facility] at [phone] to clarify.   YOU SHOULD EXPECT: Some feelings of bloating in the abdomen. Passage of more gas than usual. Walking can help get rid of the air that was put into your GI tract during the procedure and reduce the bloating. If you had a lower endoscopy (such as a colonoscopy or flexible sigmoidoscopy) you may notice spotting of blood in your stool or on the toilet paper. Some abdominal soreness may be present for a day or two, also.  DIET: Your first meal following the procedure should be a light meal and then it is ok to progress to your normal diet. A half-sandwich or bowl of soup is an example of a good first meal. Heavy or fried foods are harder to digest and may make you feel nauseous or bloated. Drink plenty of fluids but you should avoid alcoholic beverages for 24 hours.  ACTIVITY: Your care partner should take you home directly after the procedure. You should plan to take it easy, moving slowly for the rest of the day. You can resume normal activity the day after the procedure however YOU SHOULD NOT DRIVE, use power tools, machinery or perform tasks that involve climbing or major physical exertion for 24 hours (because of the sedation medicines used during the test).   SYMPTOMS TO REPORT IMMEDIATELY: A gastroenterologist can be reached at any hour. Please call [phone]  for any of the following symptoms:  Following lower endoscopy (colonoscopy, flexible sigmoidoscopy) Excessive amounts of blood in the stool  Significant tenderness, worsening of abdominal pains  Swelling of the abdomen that is new, acute  Fever of 100 or higher  Following upper endoscopy (EGD, EUS, ERCP, esophageal  dilation) Vomiting of blood or coffee ground material  New, significant abdominal pain  New, significant chest pain or pain under the shoulder blades  Painful or persistently difficult swallowing  New shortness of breath  Black, tarry-looking or red, bloody stools  FOLLOW UP:  If any biopsies were taken you will be contacted by phone or by letter within the next 1-3 weeks. Call [phone]  if you have not heard about the biopsies in 3 weeks.  Please also call with any specific questions about appointments or follow up tests.  

## 2020-01-15 NOTE — Op Note (Signed)
[facility] Patient Name: Gloria Porter Procedure Date: 01/15/2020 MRN: [id number] Attending MD: Carol Ada , MD Date of Birth: 14-Feb-1956 CSN: [id number] Age: 64 Admit Type: Outpatient Procedure:                Colonoscopy Indications:              High risk colon cancer surveillance: Personal                            history of colonic polyps Providers:                Carol Ada, MD, Cleda Daub, RN, Laverda Sorenson,                            Technician, Jefm Miles CRNA Referring MD:              Medicines:                Propofol per Anesthesia Complications:            No immediate complications. Estimated Blood Loss:     Estimated blood loss: none. Procedure:                Pre-Anesthesia Assessment:                           - Prior to the procedure, a History and Physical                            was performed, and patient medications and                            allergies were reviewed. The patient's tolerance of                            previous anesthesia was also reviewed. The risks                            and benefits of the procedure and the sedation                            options and risks were discussed with the patient.                            All questions were answered, and informed consent                            was obtained. Prior Anticoagulants: The patient has                            taken no previous anticoagulant or antiplatelet                            agents. ASA Grade Assessment: III - A patient with  severe systemic disease. After reviewing the risks                            and benefits, the patient was deemed in                            satisfactory condition to undergo the procedure.                           - Sedation was administered by an anesthesia                            professional. Deep sedation was attained.                           After obtaining informed  consent, the colonoscope                            was passed under direct vision. Throughout the                            procedure, the patient's blood pressure, pulse, and                            oxygen saturations were monitored continuously. The                            [id number]-H190DL ([id number] Olympus pediatric colonscope                            was introduced through the anus and advanced to the                            the cecum, identified by appendiceal orifice and                            ileocecal valve. The colonoscopy was performed                            without difficulty. The patient tolerated the                            procedure well. The quality of the bowel                            preparation was good. The ileocecal valve,                            appendiceal orifice, and rectum were photographed. Scope In: 71:24:58 AM Scope Out: 10:30:44 AM Scope Withdrawal Time: 0 hours 20 minutes 47 seconds  Total Procedure Duration: 0 hours 24 minutes 30 seconds  Findings:      Two sessile polyps were found in the transverse colon and ascending       colon. The polyps were 2 to 3 mm  in size. These polyps were removed with       a cold snare. Resection and retrieval were complete. Impression:               - Two 2 to 3 mm polyps in the transverse colon and                            in the ascending colon, removed with a cold snare.                            Resected and retrieved. Moderate Sedation:      Not Applicable - Patient had care per Anesthesia. Recommendation:           - Patient has a contact number available for                            emergencies. The signs and symptoms of potential                            delayed complications were discussed with the                            patient. Return to normal activities tomorrow.                            Written discharge instructions were provided to the                            patient.                            - Resume previous diet.                           - Continue present medications.                           - Await pathology results.                           - Repeat colonoscopy in 7 years for surveillance. Procedure Code(s):        --- Professional ---                           [phone], Colonoscopy, flexible; with removal of                            tumor(s), polyp(s), or other lesion(s) by snare                            technique Diagnosis Code(s):        --- Professional ---                           K63.5, Polyp of colon  Z86.010, Personal history of colonic polyps CPT copyright 2019 [employer]. All rights reserved. The codes documented in this report are preliminary and upon coder review may  be revised to meet current compliance requirements. Carol Ada, MD Carol Ada, MD 01/15/2020 10:41:23 AM This report has been signed electronically. Number of Addenda: 0

## 2020-01-18 ENCOUNTER — Other Ambulatory Visit: Payer: Self-pay

## 2020-01-18 LAB — SURGICAL PATHOLOGY

## 2020-01-20 NOTE — Progress Notes (Signed)
Office Visit Note  Patient: Gloria Porter             Date of Birth: [birth year]           MRN: [id number]             PCP: Florestine Avers (Inactive) Referring: No ref. provider found Visit Date: 02/03/2020 Occupation: @GUAROCC @  Subjective:  Pain in both hands.   History of Present Illness: Gloria Porter is a 64 y.o. female with history of mixed connective Porter disease.  She states she still have off-and-on discomfort in her hands.  She has not noticed any joint swelling.  She tried methotrexate in the past and had to discontinue due to side effects.  She states that her symptoms are quite well controlled with hydroxychloroquine.  She does not want to change or add new medications.  She continues to have mild shortness of breath with exertion.  She has seen Dr. [place] Caller in 2019 at that time high-resolution CT was negative.  She has had not noticed any increased shortness of breath.  She denies any skin tightness.  Activities of Daily Living:  Patient reports morning stiffness for  15-20  minutes.   Patient Reports nocturnal pain.  Difficulty dressing/grooming: Denies Difficulty climbing stairs: Reports Difficulty getting out of chair: Reports Difficulty using hands for taps, buttons, cutlery, and/or writing: Reports  Review of Systems  Constitutional: Positive for fatigue. Negative for night sweats, weight gain and weight loss.  HENT: Positive for mouth dryness and nose dryness. Negative for mouth sores, trouble swallowing and trouble swallowing.   Eyes: Positive for dryness. Negative for pain, redness, itching and visual disturbance.  Respiratory: Negative for cough, shortness of breath and difficulty breathing.   Cardiovascular: Negative for chest pain, palpitations, hypertension, irregular heartbeat and swelling in legs/feet.  Gastrointestinal: Positive for constipation. Negative for blood in stool and diarrhea.  Endocrine: Negative for increased urination.    Genitourinary: Positive for nocturia. Negative for difficulty urinating and vaginal dryness.  Musculoskeletal: Positive for arthralgias, joint pain, joint swelling, muscle weakness and morning stiffness. Negative for myalgias, muscle tenderness and myalgias.  Skin: Positive for color change. Negative for rash, hair loss, redness, skin tightness, ulcers and sensitivity to sunlight.  Allergic/Immunologic: Negative for susceptible to infections.  Neurological: Negative for dizziness, numbness, headaches, memory loss, night sweats and weakness.  Hematological: Negative for bruising/bleeding tendency and swollen glands.  Psychiatric/Behavioral: Positive for depressed mood and sleep disturbance. Negative for confusion. The patient is nervous/anxious.     PMFS History:  Patient Active Problem List   Diagnosis Date Noted  . History of lumbar surgery 09/16/2019  . Pseudoarthrosis of lumbar spine 03/02/2019  . Primary osteoarthritis of both hands 04/10/2017  . Primary osteoarthritis of both knees 04/10/2017  . DDD (degenerative disc disease), lumbar 04/10/2017  . Mixed connective Porter disease ([place]) 04/10/2017  . S/P trigger finger release bilateral third 03/14/2017  . MGUS (monoclonal gammopathy of unknown significance) 03/11/2017  . History of chronic kidney disease 03/11/2017  . Monoclonal paraproteinemia 01/11/2016  . Restless leg 01/11/2015  . Carpal tunnel syndrome 12/28/2014  . Dysphagia, oropharyngeal 12/28/2014  . Anxiety, generalized 10/14/2014  . Generalized joint pain 08/17/2014  . Encounter for therapeutic drug monitoring 07/13/2014  . Failed back syndrome of lumbar spine 07/13/2014  . Encounter for therapeutic drug level monitoring 07/13/2014  . 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 Diabetes ([place]) 06/16/2014  . Fibrositis 06/16/2014  . Cannot sleep 06/16/2014  . Extreme obesity  06/16/2014  . Neuropathy 06/16/2014  . Allergic rhinitis, seasonal 06/16/2014  . Post  menopausal syndrome 06/16/2014  . 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 Spinal stenosis 06/16/2014  . Adult hypothyroidism 06/16/2014  . 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 Diabetes mellitus ([place]) 06/16/2014  . Fibromyalgia 06/16/2014  . Morbid obesity ([place]) 06/16/2014  . Sjogren's syndrome ([place]) 06/16/2014  . Postmenopausal HRT (hormone replacement therapy) 06/16/2014  . Breath shortness 01/21/2014  . Essential (primary) hypertension 01/21/2014  . Acid reflux 01/21/2014  . Adiposity 01/21/2014  . Clinical depression 08/26/2013  . Obstructive apnea 01/17/2013  . Cushing's syndrome ([place]) 05/23/2011  . Pituitary adenoma ([place]) 05/23/2011    Past Medical History:  Diagnosis Date  . Anxiety   . Arthritis   . Cushing's disease ([place])    pituitary tumor removed 2012  . Diabetes mellitus    Endocrinologist Dr. Iran Planas  . Difficult intubation    2011 scratched trachea  . Dyspnea    upon exertion  . Fibromyalgia   . GERD (gastroesophageal reflux disease)   . Headache(784.0)   . Hypertension   . Hypothyroidism   . Lumbar disc disease   . MGUS (monoclonal gammopathy of unknown significance)   . Mixed connective Porter disease ([place])   . Neuropathy   . Pneumonia   . Sjogren's disease ([place])   . Sleep apnea    no cpap.sleep study 2006 [place]    Family History  Problem Relation Age of Onset  . Diabetes type II Mother   . Diabetes type II Father   . Heart disease Father   . Heart attack Father   . Colon cancer Maternal Grandmother   . Heart disease Maternal Grandfather    Past Surgical History:  Procedure Laterality Date  . APPENDECTOMY    . BACK SURGERY     lumbar fusion  . BIOPSY  01/15/2020   Procedure: BIOPSY;  Surgeon: Carol Ada, MD;  [facility];  Service: Endoscopy;;  . BRAIN SURGERY     For cushings  . CARPAL TUNNEL RELEASE Bilateral   . CESAREAN SECTION    . COLONOSCOPY    . COLONOSCOPY WITH PROPOFOL N/A 01/15/2020    Procedure: COLONOSCOPY WITH PROPOFOL;  Surgeon: Carol Ada, MD;  [facility];  Service: Endoscopy;  Laterality: N/A;  . COMBINED HYSTEROSCOPY DIAGNOSTIC / D&C    . ENDOMETRIAL ABLATION    . ESOPHAGOGASTRODUODENOSCOPY (EGD) WITH PROPOFOL N/A 01/15/2020   Procedure: ESOPHAGOGASTRODUODENOSCOPY (EGD) WITH PROPOFOL;  Surgeon: Carol Ada, MD;  [facility];  Service: Endoscopy;  Laterality: N/A;  . ESOPHAGOGASTRODUODENOSCOPY ENDOSCOPY    . FRACTURE SURGERY     l arm  . LUMBAR LAMINECTOMY/DECOMPRESSION MICRODISCECTOMY  11/13/2011   Procedure: LUMBAR LAMINECTOMY/DECOMPRESSION MICRODISCECTOMY;  Surgeon: Faythe Ghee, MD;  Location: [facility];  Service: Neurosurgery;  Laterality: Bilateral;  Bilateral Lumbar four-five Decompression  . POLYPECTOMY  01/15/2020   Procedure: POLYPECTOMY;  Surgeon: Carol Ada, MD;  [facility];  Service: Endoscopy;;  . TONSILLECTOMY    . TRANSPHENOIDAL / TRANSNASAL HYPOPHYSECTOMY / RESECTION PITUITARY TUMOR    . TRIGGER FINGER RELEASE    . TUBAL LIGATION     Social History   Social History Narrative  . Not on file   Immunization History  Administered Date(s) Administered  . Influenza Inj Mdck Quad Pf 07/12/2017, 07/14/2018  . Influenza Split 06/13/2017  . Pneumococcal Conjugate-13 12/28/2014  . Pneumococcal Polysaccharide-23 01/03/2017     Objective: Vital Signs: BP 131/67 (BP Location: Left  Arm, Patient Position: Sitting, Cuff Size: Normal)   Pulse 72   Resp 17   Ht 5\' 3"  (1.6 m)   Wt 249 lb (112.9 kg)   LMP 11/08/2006   BMI 44.11 kg/m    Physical Exam Vitals and nursing note reviewed.  Constitutional:      Appearance: She is well-developed.  HENT:     Head: Normocephalic and atraumatic.  Eyes:     Conjunctiva/sclera: Conjunctivae normal.  Cardiovascular:     Rate and Rhythm: Normal rate and regular rhythm.     Heart sounds: Normal heart sounds.  Pulmonary:     Effort: Pulmonary effort is normal.      Breath sounds: Normal breath sounds.  Abdominal:     General: Bowel sounds are normal.     Palpations: Abdomen is soft.  Musculoskeletal:     Cervical back: Normal range of motion.  Lymphadenopathy:     Cervical: No cervical adenopathy.  Skin:    General: Skin is warm and dry.     Capillary Refill: Capillary refill takes less than 2 seconds.  Neurological:     Mental Status: She is alert and oriented to person, place, and time.  Psychiatric:        Behavior: Behavior normal.      Musculoskeletal Exam: C-spine thoracic and lumbar spine with good range of motion.  Shoulder joints, elbow joints, wrist joints, MCPs and PIPs and DIPs with good range of motion.  She had no synovitis on examination.  Hip joints, knee joints, ankles, MTPs and PIPs with good range of motion with no synovitis.  CDAI Exam: CDAI Score: -- Patient Global: --; Provider Global: -- Swollen: --; Tender: -- Joint Exam 02/03/2020   No joint exam has been documented for this visit   There is currently no information documented on the homunculus. Go to the Rheumatology activity and complete the homunculus joint exam.  Investigation: No additional findings.  Imaging: No results found.  Recent Labs: Lab Results  Component Value Date   WBC 10.3 05/20/2019   HGB 11.4 (L) 05/20/2019   PLT 159 05/20/2019   NA 136 05/20/2019   K 4.3 05/20/2019   CL 100 05/20/2019   CO2 25 05/20/2019   GLUCOSE 259 (H) 05/20/2019   BUN 20 05/20/2019   CREATININE 1.13 (H) 05/20/2019   BILITOT 0.9 05/20/2019   ALKPHOS 65 05/20/2019   AST 32 05/20/2019   ALT 27 05/20/2019   PROT 7.5 09/16/2019   ALBUMIN 3.7 05/20/2019   CALCIUM 8.6 (L) 05/20/2019   GFRAA >60 05/20/2019    Speciality Comments: PLQ eye exam: 02/09/19 Normal. [facility]. Follow up in 1 year  Procedures:  No procedures performed Allergies: Codeine, Atorvastatin, Methotrexate, and Quinapril hcl   Assessment / Plan:     Visit Diagnoses: Mixed  connective Porter disease ([place]) - ANA 1:160 NS, RNP positive, inflammatory arthritis -patient had no synovitis on my examination today.  She gives history of intermittent swelling.  We will check autoimmune labs again today.  Plan: Urinalysis, Routine w reflex microscopic, ANA, Anti-DNA antibody, double-stranded, C3 and C4, Sedimentation rate, RNP Antibody, hydroxychloroquine (PLAQUENIL) 200 MG tablet  High risk medication use - Plaquenil 200 mg twice daily.  PLQ eye exam: 02/09/19.  D/c MTX due to nausea and vomiting. - Plan: CBC with Differential/Platelet, COMPLETE METABOLIC PANEL WITH GFR.  Patient was advised to get another eye examination.  Sjogren's syndrome with keratoconjunctivitis sicca (HCC)-she continues to have some sicca symptoms which  are manageable with over-the-counter products.  Skin thickening - Secondary to diabetes.  Primary osteoarthritis of both hands-joint protection muscle strengthening was discussed.  Primary osteoarthritis of both knees-she has mild chronic discomfort.  DDD (degenerative disc disease), lumbar - s/p laminectomy -Chronic pain.  She had a L5-S1 fusion on 03/02/2019 performed by Dr. Arnoldo Morale.  Fibromyalgia-she continues to have some generalized pain.  MGUS (monoclonal gammopathy of unknown significance) - She is followed by hematology.  Patient states she had recent labs done by her neurologist.  History of chronic kidney disease-her last GFR was greater than 60.  Other medical problems are listed as follows:  History of hypothyroidism  History of Cushing disease  History of depression  History of diabetes mellitus  History of anxiety  Palpitations  History of chronic pain  Orders: Orders Placed This Encounter  Procedures  . CBC with Differential/Platelet  . COMPLETE METABOLIC PANEL WITH GFR  . Urinalysis, Routine w reflex microscopic  . ANA  . Anti-DNA antibody, double-stranded  . C3 and C4  . Sedimentation rate  . RNP Antibody    Meds ordered this encounter  Medications  . hydroxychloroquine (PLAQUENIL) 200 MG tablet    Sig: Take 1 tablet (200 mg total) by mouth 2 (two) times daily.    Dispense:  180 tablet    Refill:  0      Follow-Up Instructions: Return in about 5 months (around 07/05/2020) for MCTD.   Bo Merino, MD  Note - This record has been created using Editor, commissioning.  Chart creation errors have been sought, but may not always  have been located. Such creation errors do not reflect on  the standard of medical care.

## 2020-01-26 ENCOUNTER — Other Ambulatory Visit: Payer: Self-pay | Admitting: Gastroenterology

## 2020-01-26 ENCOUNTER — Other Ambulatory Visit (HOSPITAL_COMMUNITY): Payer: Self-pay | Admitting: Gastroenterology

## 2020-01-26 DIAGNOSIS — R110 Nausea: Secondary | ICD-10-CM

## 2020-02-03 ENCOUNTER — Ambulatory Visit (INDEPENDENT_AMBULATORY_CARE_PROVIDER_SITE_OTHER): Payer: PRIVATE HEALTH INSURANCE | Admitting: Rheumatology

## 2020-02-03 ENCOUNTER — Encounter: Payer: Self-pay | Admitting: Rheumatology

## 2020-02-03 ENCOUNTER — Other Ambulatory Visit: Payer: Self-pay

## 2020-02-03 VITALS — BP 131/67 | HR 72 | Resp 17 | Ht 63.0 in | Wt 249.0 lb

## 2020-02-03 DIAGNOSIS — R002 Palpitations: Secondary | ICD-10-CM

## 2020-02-03 DIAGNOSIS — Z87898 Personal history of other specified conditions: Secondary | ICD-10-CM

## 2020-02-03 DIAGNOSIS — Z79899 Other long term (current) drug therapy: Secondary | ICD-10-CM

## 2020-02-03 DIAGNOSIS — R234 Changes in skin texture: Secondary | ICD-10-CM

## 2020-02-03 DIAGNOSIS — M19041 Primary osteoarthritis, right hand: Secondary | ICD-10-CM

## 2020-02-03 DIAGNOSIS — Z8639 Personal history of other endocrine, nutritional and metabolic disease: Secondary | ICD-10-CM

## 2020-02-03 DIAGNOSIS — M351 Other overlap syndromes: Secondary | ICD-10-CM | POA: Diagnosis not present

## 2020-02-03 DIAGNOSIS — M3501 Sicca syndrome with keratoconjunctivitis: Secondary | ICD-10-CM

## 2020-02-03 DIAGNOSIS — Z8659 Personal history of other mental and behavioral disorders: Secondary | ICD-10-CM

## 2020-02-03 DIAGNOSIS — M170 Bilateral primary osteoarthritis of knee: Secondary | ICD-10-CM

## 2020-02-03 DIAGNOSIS — M19042 Primary osteoarthritis, left hand: Secondary | ICD-10-CM

## 2020-02-03 DIAGNOSIS — D472 Monoclonal gammopathy: Secondary | ICD-10-CM

## 2020-02-03 DIAGNOSIS — M5136 Other intervertebral disc degeneration, lumbar region: Secondary | ICD-10-CM

## 2020-02-03 DIAGNOSIS — M797 Fibromyalgia: Secondary | ICD-10-CM

## 2020-02-03 DIAGNOSIS — M51369 Other intervertebral disc degeneration, lumbar region without mention of lumbar back pain or lower extremity pain: Secondary | ICD-10-CM

## 2020-02-03 DIAGNOSIS — Z87448 Personal history of other diseases of urinary system: Secondary | ICD-10-CM

## 2020-02-03 MED ORDER — HYDROXYCHLOROQUINE SULFATE 200 MG PO TABS: 200.0000 mg | ORAL_TABLET | Freq: Two times a day (BID) | ORAL | 0 refills | Status: DC

## 2020-02-04 LAB — COMPLETE METABOLIC PANEL WITHOUT GFR
AG Ratio: 1.1 (calc) (ref 1.0–2.5)
ALT: 17 U/L (ref 6–29)
AST: 17 U/L (ref 10–35)
Albumin: 4 g/dL (ref 3.6–5.1)
Alkaline phosphatase (APISO): 80 U/L (ref 37–153)
BUN: 12 mg/dL (ref 7–25)
CO2: 25 mmol/L (ref 20–32)
Calcium: 9.7 mg/dL (ref 8.6–10.4)
Chloride: 103 mmol/L (ref 98–110)
Creat: 0.67 mg/dL (ref 0.50–0.99)
GFR, Est African American: 108 mL/min/1.73m2
GFR, Est Non African American: 94 mL/min/1.73m2
Globulin: 3.5 g/dL (ref 1.9–3.7)
Glucose, Bld: 181 mg/dL — ABNORMAL HIGH (ref 65–99)
Potassium: 4.4 mmol/L (ref 3.5–5.3)
Sodium: 139 mmol/L (ref 135–146)
Total Bilirubin: 0.4 mg/dL (ref 0.2–1.2)
Total Protein: 7.5 g/dL (ref 6.1–8.1)

## 2020-02-04 LAB — CBC WITH DIFFERENTIAL/PLATELET
Absolute Monocytes: 405 cells/uL (ref 200–950)
Basophils Absolute: 40 cells/uL (ref 0–200)
Basophils Relative: 0.7 %
Eosinophils Absolute: 108 cells/uL (ref 15–500)
Eosinophils Relative: 1.9 %
HCT: 42.6 % (ref 35.0–45.0)
Hemoglobin: 13.7 g/dL (ref 11.7–15.5)
Lymphs Abs: 1362 cells/uL (ref 850–3900)
MCH: 28.1 pg (ref 27.0–33.0)
MCHC: 32.2 g/dL (ref 32.0–36.0)
MCV: 87.5 fL (ref 80.0–100.0)
MPV: 10.8 fL (ref 7.5–12.5)
Monocytes Relative: 7.1 %
Neutro Abs: 3785 cells/uL (ref 1500–7800)
Neutrophils Relative %: 66.4 %
Platelets: 200 10*3/uL (ref 140–400)
RBC: 4.87 10*6/uL (ref 3.80–5.10)
RDW: 14.5 % (ref 11.0–15.0)
Total Lymphocyte: 23.9 %
WBC: 5.7 10*3/uL (ref 3.8–10.8)

## 2020-02-04 LAB — RNP ANTIBODY: Ribonucleic Protein(ENA) Antibody, IgG: <1 AI

## 2020-02-04 LAB — ANA: Anti Nuclear Antibody (ANA): NEGATIVE

## 2020-02-04 LAB — URINALYSIS, ROUTINE W REFLEX MICROSCOPIC
Bilirubin Urine: NEGATIVE
Hgb urine dipstick: NEGATIVE
Ketones, ur: NEGATIVE
Leukocytes,Ua: NEGATIVE
Nitrite: NEGATIVE
Protein, ur: NEGATIVE
Specific Gravity, Urine: 1.017 (ref 1.001–1.03)
pH: 5.5 (ref 5.0–8.0)

## 2020-02-04 LAB — SEDIMENTATION RATE: Sed Rate: 36 mm/h — ABNORMAL HIGH (ref 0–30)

## 2020-02-04 LAB — C3 AND C4
C3 Complement: 155 mg/dL (ref 83–193)
C4 Complement: 20 mg/dL (ref 15–57)

## 2020-02-04 LAB — ANTI-DNA ANTIBODY, DOUBLE-STRANDED: ds DNA Ab: <1 [IU]/mL

## 2020-02-04 NOTE — Progress Notes (Signed)
Glucose is elevated.  Rest of the labs are pending.

## 2020-02-05 NOTE — Progress Notes (Signed)
CBC is normal.  All autoimmune antibodies are negative.  Sedimentation is mildly elevated.  Continue current treatment.

## 2020-02-16 ENCOUNTER — Encounter (HOSPITAL_COMMUNITY)
Admission: RE | Admit: 2020-02-16 | Discharge: 2020-02-16 | Disposition: A | Discharge status: Home / Self Care | Source: Ambulatory Visit | Attending: Gastroenterology | Admitting: Gastroenterology

## 2020-02-16 ENCOUNTER — Other Ambulatory Visit: Payer: Self-pay

## 2020-02-16 DIAGNOSIS — R110 Nausea: Secondary | ICD-10-CM | POA: Diagnosis present

## 2020-02-16 MED ORDER — TECHNETIUM TC 99M SULFUR COLLOID
2.0300 | Freq: Once | INTRAVENOUS | Status: AC | PRN
  Administered 2020-02-16: 2.03 via INTRAVENOUS

## 2020-02-24 ENCOUNTER — Telehealth: Payer: Self-pay

## 2020-02-24 ENCOUNTER — Other Ambulatory Visit: Payer: Self-pay | Admitting: Neurosurgery

## 2020-02-24 DIAGNOSIS — G8929 Other chronic pain: Secondary | ICD-10-CM

## 2020-02-24 NOTE — Telephone Encounter (Signed)
Spoke with patient to review her medications before getting her scheduled for a lumbar myelogram.  She states an understanding she will be here two hours, will need a driver and will need to be on strict bedrest for 24 hours after the procedure.  Also, she states an understanding to hold Bupropion and Phenergan for 48 hours before, and 24 hours after, the myelogram.

## 2020-03-02 ENCOUNTER — Other Ambulatory Visit: Payer: Self-pay

## 2020-03-02 ENCOUNTER — Ambulatory Visit
Admission: RE | Admit: 2020-03-02 | Discharge: 2020-03-02 | Disposition: A | Discharge status: Home / Self Care | Source: Ambulatory Visit | Attending: Neurosurgery | Admitting: Neurosurgery

## 2020-03-02 VITALS — BP 117/40 | HR 66

## 2020-03-02 DIAGNOSIS — M51369 Other intervertebral disc degeneration, lumbar region without mention of lumbar back pain or lower extremity pain: Secondary | ICD-10-CM

## 2020-03-02 DIAGNOSIS — G8929 Other chronic pain: Secondary | ICD-10-CM

## 2020-03-02 DIAGNOSIS — S32009K Unspecified fracture of unspecified lumbar vertebra, subsequent encounter for fracture with nonunion: Secondary | ICD-10-CM

## 2020-03-02 DIAGNOSIS — M5136 Other intervertebral disc degeneration, lumbar region: Secondary | ICD-10-CM

## 2020-03-02 MED ORDER — ONDANSETRON HCL 4 MG/2ML IJ SOLN
4.0000 mg | Freq: Once | INTRAMUSCULAR | Status: AC
  Administered 2020-03-02: 4 mg via INTRAMUSCULAR

## 2020-03-02 MED ORDER — IOPAMIDOL (ISOVUE-M 200) INJECTION 41%
15.0000 mL | Freq: Once | INTRAMUSCULAR | Status: AC
  Administered 2020-03-02: 15 mL via INTRATHECAL

## 2020-03-02 MED ORDER — MEPERIDINE HCL 100 MG/ML IJ SOLN: 75.0000 mg | Freq: Once | INTRAMUSCULAR | Status: DC

## 2020-03-02 MED ORDER — DIAZEPAM 5 MG PO TABS
10.0000 mg | ORAL_TABLET | Freq: Once | ORAL | Status: AC
  Administered 2020-03-02: 10 mg via ORAL

## 2020-03-02 MED ORDER — MEPERIDINE HCL 100 MG/ML IJ SOLN
100.0000 mg | Freq: Once | INTRAMUSCULAR | Status: AC
  Administered 2020-03-02: 100 mg via INTRAMUSCULAR

## 2020-03-02 NOTE — Progress Notes (Signed)
Patient states she has been off Bupropion and Phenergan for at least the past two days.

## 2020-03-02 NOTE — Discharge Instructions (Signed)
Myelogram Discharge Instructions  1. Go home and rest quietly for the next 24 hours.  It is important to lie flat for the next 24 hours.  Get up only to go to the restroom.  You may lie in the bed or on a couch on your back, your stomach, your left side or your right side.  You may have one pillow under your head.  You may have pillows between your knees while you are on your side or under your knees while you are on your back.  2. DO NOT drive today.  Recline the seat as far back as it will go, while still wearing your seat belt, on the way home.  3. You may get up to go to the bathroom as needed.  You may sit up for 10 minutes to eat.  You may resume your normal diet and medications unless otherwise indicated.  Drink plenty of extra fluids today and tomorrow.  4. The incidence of a spinal headache with nausea and/or vomiting is about 5% (one in 20 patients).  If you develop a headache, lie flat and drink plenty of fluids until the headache goes away.  Caffeinated beverages may be helpful.  If you develop severe nausea and vomiting or a headache that does not go away with flat bed rest, call [phone].  5. You may resume normal activities after your 24 hours of bed rest is over; however, do not exert yourself strongly or do any heavy lifting tomorrow.  6. Call your physician for a follow-up appointment.   You may resume Bupropion and Phenergan on Thursday, March 03, 2020 after 10:30a.m.

## 2020-06-28 NOTE — Progress Notes (Deleted)
Office Visit Note  Patient: Gloria Porter             Date of Birth: 08-17-1955           MRN: [id number]             PCP: Florestine Avers (Inactive) Referring: No ref. provider found Visit Date: 07/12/2020 Occupation: @GUAROCC @  Subjective:  No chief complaint on file.   History of Present Illness: Gloria Porter is a 64 y.o. female ***   Activities of Daily Living:  Patient reports morning stiffness for *** {minute/hour:19697}.   Patient {ACTIONS;DENIES/REPORTS:21021675::"Denies"} nocturnal pain.  Difficulty dressing/grooming: {ACTIONS;DENIES/REPORTS:21021675::"Denies"} Difficulty climbing stairs: {ACTIONS;DENIES/REPORTS:21021675::"Denies"} Difficulty getting out of chair: {ACTIONS;DENIES/REPORTS:21021675::"Denies"} Difficulty using hands for taps, buttons, cutlery, and/or writing: {ACTIONS;DENIES/REPORTS:21021675::"Denies"}  No Rheumatology ROS completed.   PMFS History:  Patient Active Problem List   Diagnosis Date Noted  . History of lumbar surgery 09/16/2019  . Pseudoarthrosis of lumbar spine 03/02/2019  . Primary osteoarthritis of both hands 04/10/2017  . Primary osteoarthritis of both knees 04/10/2017  . DDD (degenerative disc disease), lumbar 04/10/2017  . Mixed connective tissue disease ([place]) 04/10/2017  . S/P trigger finger release bilateral third 03/14/2017  . MGUS (monoclonal gammopathy of unknown significance) 03/11/2017  . History of chronic kidney disease 03/11/2017  . Monoclonal paraproteinemia 01/11/2016  . Restless leg 01/11/2015  . Carpal tunnel syndrome 12/28/2014  . Dysphagia, oropharyngeal 12/28/2014  . Anxiety, generalized 10/14/2014  . Generalized joint pain 08/17/2014  . Encounter for therapeutic drug monitoring 07/13/2014  . Failed back syndrome of lumbar spine 07/13/2014  . Encounter for therapeutic drug level monitoring 07/13/2014  . 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 Diabetes ([place]) 06/16/2014  . Fibrositis 06/16/2014  .  Cannot sleep 06/16/2014  . Extreme obesity 06/16/2014  . Neuropathy 06/16/2014  . Allergic rhinitis, seasonal 06/16/2014  . Post menopausal syndrome 06/16/2014  . 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 Spinal stenosis 06/16/2014  . Adult hypothyroidism 06/16/2014  . 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 Diabetes mellitus ([place]) 06/16/2014  . Fibromyalgia 06/16/2014  . Morbid obesity ([place]) 06/16/2014  . Sjogren's syndrome ([place]) 06/16/2014  . Postmenopausal HRT (hormone replacement therapy) 06/16/2014  . Breath shortness 01/21/2014  . Essential (primary) hypertension 01/21/2014  . Acid reflux 01/21/2014  . Adiposity 01/21/2014  . Clinical depression 08/26/2013  . Obstructive apnea 01/17/2013  . Cushing's syndrome ([place]) 05/23/2011  . Pituitary adenoma ([place]) 05/23/2011    Past Medical History:  Diagnosis Date  . Anxiety   . Arthritis   . Cushing's disease ([place])    pituitary tumor removed 2012  . Diabetes mellitus    Endocrinologist Dr. Iran Planas  . Difficult intubation    2011 scratched trachea  . Dyspnea    upon exertion  . Fibromyalgia   . GERD (gastroesophageal reflux disease)   . Headache(784.0)   . Hypertension   . Hypothyroidism   . Lumbar disc disease   . MGUS (monoclonal gammopathy of unknown significance)   . Mixed connective tissue disease ([place])   . Neuropathy   . Pneumonia   . Sjogren's disease ([place])   . Sleep apnea    no cpap.sleep study 2006 [place]    Family History  Problem Relation Age of Onset  . Diabetes type II Mother   . Diabetes type II Father   . Heart disease Father   . Heart attack Father   . Colon cancer Maternal Grandmother   . Heart disease Maternal Grandfather    Past  Surgical History:  Procedure Laterality Date  . APPENDECTOMY    . BACK SURGERY     lumbar fusion  . BIOPSY  01/15/2020   Procedure: BIOPSY;  Surgeon: Carol Ada, MD;  [facility];  Service: Endoscopy;;  . BRAIN SURGERY     For  cushings  . CARPAL TUNNEL RELEASE Bilateral   . CESAREAN SECTION    . COLONOSCOPY    . COLONOSCOPY WITH PROPOFOL N/A 01/15/2020   Procedure: COLONOSCOPY WITH PROPOFOL;  Surgeon: Carol Ada, MD;  [facility];  Service: Endoscopy;  Laterality: N/A;  . COMBINED HYSTEROSCOPY DIAGNOSTIC / D&C    . ENDOMETRIAL ABLATION    . ESOPHAGOGASTRODUODENOSCOPY (EGD) WITH PROPOFOL N/A 01/15/2020   Procedure: ESOPHAGOGASTRODUODENOSCOPY (EGD) WITH PROPOFOL;  Surgeon: Carol Ada, MD;  [facility];  Service: Endoscopy;  Laterality: N/A;  . ESOPHAGOGASTRODUODENOSCOPY ENDOSCOPY    . FRACTURE SURGERY     l arm  . LUMBAR LAMINECTOMY/DECOMPRESSION MICRODISCECTOMY  11/13/2011   Procedure: LUMBAR LAMINECTOMY/DECOMPRESSION MICRODISCECTOMY;  Surgeon: Faythe Ghee, MD;  Location: [facility];  Service: Neurosurgery;  Laterality: Bilateral;  Bilateral Lumbar four-five Decompression  . POLYPECTOMY  01/15/2020   Procedure: POLYPECTOMY;  Surgeon: Carol Ada, MD;  [facility];  Service: Endoscopy;;  . TONSILLECTOMY    . TRANSPHENOIDAL / TRANSNASAL HYPOPHYSECTOMY / RESECTION PITUITARY TUMOR    . TRIGGER FINGER RELEASE    . TUBAL LIGATION     Social History   Social History Narrative  . Not on file   Immunization History  Administered Date(s) Administered  . Influenza Inj Mdck Quad Pf 07/12/2017, 07/14/2018  . Influenza Split 06/13/2017  . Pneumococcal Conjugate-13 12/28/2014  . Pneumococcal Polysaccharide-23 01/03/2017     Objective: Vital Signs: LMP 11/08/2006    Physical Exam   Musculoskeletal Exam: ***  CDAI Exam: CDAI Score: -- Patient Global: --; Provider Global: -- Swollen: --; Tender: -- Joint Exam 07/12/2020   No joint exam has been documented for this visit   There is currently no information documented on the homunculus. Go to the Rheumatology activity and complete the homunculus joint exam.  Investigation: No additional findings.  Imaging: No  results found.  Recent Labs: Lab Results  Component Value Date   WBC 5.7 02/03/2020   HGB 13.7 02/03/2020   PLT 200 02/03/2020   NA 139 02/03/2020   K 4.4 02/03/2020   CL 103 02/03/2020   CO2 25 02/03/2020   GLUCOSE 181 (H) 02/03/2020   BUN 12 02/03/2020   CREATININE 0.67 02/03/2020   BILITOT 0.4 02/03/2020   ALKPHOS 65 05/20/2019   AST 17 02/03/2020   ALT 17 02/03/2020   PROT 7.5 02/03/2020   ALBUMIN 3.7 05/20/2019   CALCIUM 9.7 02/03/2020   GFRAA 108 02/03/2020    Speciality Comments: PLQ eye exam: 02/09/19 Normal. [facility]. Follow up in 1 year  Procedures:  No procedures performed Allergies: Codeine, Methotrexate, Atorvastatin, and Quinapril hcl   Assessment / Plan:     Visit Diagnoses: Mixed connective tissue disease ([place])  High risk medication use  Sjogren's syndrome with keratoconjunctivitis sicca (HCC)  Primary osteoarthritis of both hands  Primary osteoarthritis of both knees  DDD (degenerative disc disease), lumbar  Fibromyalgia  MGUS (monoclonal gammopathy of unknown significance)  History of chronic kidney disease  History of hypothyroidism  History of Cushing disease  History of diabetes mellitus  Skin thickening  History of depression  History of anxiety  Orders: No orders of the defined types  were placed in this encounter.  No orders of the defined types were placed in this encounter.   Face-to-face time spent with patient was *** minutes. Greater than 50% of time was spent in counseling and coordination of care.  Follow-Up Instructions: No follow-ups on file.   Ofilia Neas, PA-C  Note - This record has been created using Dragon software.  Chart creation errors have been sought, but may not always  have been located. Such creation errors do not reflect on  the standard of medical care.

## 2020-07-12 ENCOUNTER — Ambulatory Visit: Admitting: Rheumatology

## 2020-07-12 DIAGNOSIS — Z8639 Personal history of other endocrine, nutritional and metabolic disease: Secondary | ICD-10-CM

## 2020-07-12 DIAGNOSIS — M3501 Sicca syndrome with keratoconjunctivitis: Secondary | ICD-10-CM

## 2020-07-12 DIAGNOSIS — M351 Other overlap syndromes: Secondary | ICD-10-CM

## 2020-07-12 DIAGNOSIS — M19042 Primary osteoarthritis, left hand: Secondary | ICD-10-CM

## 2020-07-12 DIAGNOSIS — Z79899 Other long term (current) drug therapy: Secondary | ICD-10-CM

## 2020-07-12 DIAGNOSIS — Z87448 Personal history of other diseases of urinary system: Secondary | ICD-10-CM

## 2020-07-12 DIAGNOSIS — Z8659 Personal history of other mental and behavioral disorders: Secondary | ICD-10-CM

## 2020-07-12 DIAGNOSIS — M5136 Other intervertebral disc degeneration, lumbar region: Secondary | ICD-10-CM

## 2020-07-12 DIAGNOSIS — M170 Bilateral primary osteoarthritis of knee: Secondary | ICD-10-CM

## 2020-07-12 DIAGNOSIS — D472 Monoclonal gammopathy: Secondary | ICD-10-CM

## 2020-07-12 DIAGNOSIS — R234 Changes in skin texture: Secondary | ICD-10-CM

## 2020-07-12 DIAGNOSIS — M797 Fibromyalgia: Secondary | ICD-10-CM

## 2020-07-13 ENCOUNTER — Ambulatory Visit: Admitting: Rheumatology

## 2020-07-14 NOTE — Progress Notes (Deleted)
Office Visit Note  Patient: Gloria Porter             Date of Birth: 1956-03-30           MRN: [id number]             PCP: Florestine Avers (Inactive) Referring: No ref. provider found Visit Date: 07/28/2020 Occupation: @GUAROCC @  Subjective:  No chief complaint on file.   History of Present Illness: Gloria Porter is a 64 y.o. female ***   Activities of Daily Living:  Patient reports morning stiffness for *** {minute/hour:19697}.   Patient {ACTIONS;DENIES/REPORTS:21021675::"Denies"} nocturnal pain.  Difficulty dressing/grooming: {ACTIONS;DENIES/REPORTS:21021675::"Denies"} Difficulty climbing stairs: {ACTIONS;DENIES/REPORTS:21021675::"Denies"} Difficulty getting out of chair: {ACTIONS;DENIES/REPORTS:21021675::"Denies"} Difficulty using hands for taps, buttons, cutlery, and/or writing: {ACTIONS;DENIES/REPORTS:21021675::"Denies"}  No Rheumatology ROS completed.   PMFS History:  Patient Active Problem List   Diagnosis Date Noted  . History of lumbar surgery 09/16/2019  . Pseudoarthrosis of lumbar spine 03/02/2019  . Primary osteoarthritis of both hands 04/10/2017  . Primary osteoarthritis of both knees 04/10/2017  . DDD (degenerative disc disease), lumbar 04/10/2017  . Mixed connective tissue disease ([place]) 04/10/2017  . S/P trigger finger release bilateral third 03/14/2017  . MGUS (monoclonal gammopathy of unknown significance) 03/11/2017  . History of chronic kidney disease 03/11/2017  . Monoclonal paraproteinemia 01/11/2016  . Restless leg 01/11/2015  . Carpal tunnel syndrome 12/28/2014  . Dysphagia, oropharyngeal 12/28/2014  . Anxiety, generalized 10/14/2014  . Generalized joint pain 08/17/2014  . Encounter for therapeutic drug monitoring 07/13/2014  . Failed back syndrome of lumbar spine 07/13/2014  . Encounter for therapeutic drug level monitoring 07/13/2014  . 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 Diabetes ([place]) 06/16/2014  . Fibrositis 06/16/2014  .  Cannot sleep 06/16/2014  . Extreme obesity 06/16/2014  . Neuropathy 06/16/2014  . Allergic rhinitis, seasonal 06/16/2014  . Post menopausal syndrome 06/16/2014  . 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 Spinal stenosis 06/16/2014  . Adult hypothyroidism 06/16/2014  . 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 Diabetes mellitus ([place]) 06/16/2014  . Fibromyalgia 06/16/2014  . Morbid obesity ([place]) 06/16/2014  . Sjogren's syndrome ([place]) 06/16/2014  . Postmenopausal HRT (hormone replacement therapy) 06/16/2014  . Breath shortness 01/21/2014  . Essential (primary) hypertension 01/21/2014  . Acid reflux 01/21/2014  . Adiposity 01/21/2014  . Clinical depression 08/26/2013  . Obstructive apnea 01/17/2013  . 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 Pituitary adenoma ([place]) 05/23/2011    Past Medical History:  Diagnosis Date  . Anxiety   . Arthritis   . Cushing's disease ([place])    pituitary tumor removed 2012  . Diabetes mellitus    Endocrinologist Dr. Iran Planas  . Difficult intubation    2011 scratched trachea  . Dyspnea    upon exertion  . Fibromyalgia   . GERD (gastroesophageal reflux disease)   . Headache(784.0)   . Hypertension   . Hypothyroidism   . Lumbar disc disease   . MGUS (monoclonal gammopathy of unknown significance)   . Mixed connective tissue disease ([place])   . Neuropathy   . Pneumonia   . Sjogren's disease ([place])   . Sleep apnea    no cpap.sleep study 2006 [place]    Family History  Problem Relation Age of Onset  . Diabetes type II Mother   . Diabetes type II Father   . Heart disease Father   . Heart attack Father   . Colon cancer Maternal Grandmother   . Heart disease Maternal Grandfather    Past  Surgical History:  Procedure Laterality Date  . APPENDECTOMY    . BACK SURGERY     lumbar fusion  . BIOPSY  01/15/2020   Procedure: BIOPSY;  Surgeon: Carol Ada, MD;  Location: [facility];  Service: Endoscopy;;  . BRAIN SURGERY     For  cushings  . CARPAL TUNNEL RELEASE Bilateral   . CESAREAN SECTION    . COLONOSCOPY    . COLONOSCOPY WITH PROPOFOL N/A 01/15/2020   Procedure: COLONOSCOPY WITH PROPOFOL;  Surgeon: Carol Ada, MD;  Location: [facility];  Service: Endoscopy;  Laterality: N/A;  . COMBINED HYSTEROSCOPY DIAGNOSTIC / D&C    . ENDOMETRIAL ABLATION    . ESOPHAGOGASTRODUODENOSCOPY (EGD) WITH PROPOFOL N/A 01/15/2020   Procedure: ESOPHAGOGASTRODUODENOSCOPY (EGD) WITH PROPOFOL;  Surgeon: Carol Ada, MD;  Location: [facility];  Service: Endoscopy;  Laterality: N/A;  . ESOPHAGOGASTRODUODENOSCOPY ENDOSCOPY    . FRACTURE SURGERY     l arm  . LUMBAR LAMINECTOMY/DECOMPRESSION MICRODISCECTOMY  11/13/2011   Procedure: LUMBAR LAMINECTOMY/DECOMPRESSION MICRODISCECTOMY;  Surgeon: Faythe Ghee, MD;  Location: [facility];  Service: Neurosurgery;  Laterality: Bilateral;  Bilateral Lumbar four-five Decompression  . POLYPECTOMY  01/15/2020   Procedure: POLYPECTOMY;  Surgeon: Carol Ada, MD;  Location: [facility];  Service: Endoscopy;;  . TONSILLECTOMY    . TRANSPHENOIDAL / TRANSNASAL HYPOPHYSECTOMY / RESECTION PITUITARY TUMOR    . TRIGGER FINGER RELEASE    . TUBAL LIGATION     Social History   Social History Narrative  . Not on file   Immunization History  Administered Date(s) Administered  . Influenza Inj Mdck Quad Pf 07/12/2017, 07/14/2018  . Influenza Split 06/13/2017  . Pneumococcal Conjugate-13 12/28/2014  . Pneumococcal Polysaccharide-23 01/03/2017     Objective: Vital Signs: LMP 11/08/2006    Physical Exam   Musculoskeletal Exam: ***  CDAI Exam: CDAI Score: -- Patient Global: --; Provider Global: -- Swollen: --; Tender: -- Joint Exam 07/28/2020   No joint exam has been documented for this visit   There is currently no information documented on the homunculus. Go to the Rheumatology activity and complete the homunculus joint exam.  Investigation: No additional findings.  Imaging: No  results found.  Recent Labs: Lab Results  Component Value Date   WBC 5.7 02/03/2020   HGB 13.7 02/03/2020   PLT 200 02/03/2020   NA 139 02/03/2020   K 4.4 02/03/2020   CL 103 02/03/2020   CO2 25 02/03/2020   GLUCOSE 181 (H) 02/03/2020   BUN 12 02/03/2020   CREATININE 0.67 02/03/2020   BILITOT 0.4 02/03/2020   ALKPHOS 65 05/20/2019   AST 17 02/03/2020   ALT 17 02/03/2020   PROT 7.5 02/03/2020   ALBUMIN 3.7 05/20/2019   CALCIUM 9.7 02/03/2020   GFRAA 108 02/03/2020    Speciality Comments: PLQ eye exam: 02/09/19 Normal. [facility]. Follow up in 1 year  Procedures:  No procedures performed Allergies: Codeine, Methotrexate, Atorvastatin, and Quinapril hcl   Assessment / Plan:     Visit Diagnoses: No diagnosis found.  Orders: No orders of the defined types were placed in this encounter.  No orders of the defined types were placed in this encounter.   Face-to-face time spent with patient was *** minutes. Greater than 50% of time was spent in counseling and coordination of care.  Follow-Up Instructions: No follow-ups on file.   Earnestine Mealing, CMA  Note - This record has been created using Editor, commissioning.  Chart creation errors have been sought, but  may not always  have been located. Such creation errors do not reflect on  the standard of medical care.

## 2020-07-19 ENCOUNTER — Telehealth: Payer: Self-pay

## 2020-07-19 NOTE — Telephone Encounter (Signed)
Patient stated she had cataract surgery on her left eye 3 weeks ago and on her right eye last week and has a follow-up appointment on Thursday, 07/21/20.  Patient states in order for her insurance to pay for Plaquenil eye exam it has to be a separate exam.  Patient states she will be able to schedule after the first of the year.

## 2020-07-20 NOTE — Progress Notes (Signed)
Office Visit Note  Patient: Gloria Porter             Date of Birth: 1955-11-19           MRN: [id number]             PCP: Florestine Avers (Inactive) Referring: No ref. provider found Visit Date: 07/28/2020 Occupation: @GUAROCC @  Subjective:  Pain in both hands   History of Present Illness: Gloria Porter is a 64 y.o. female with history of mixed connective tissue disease, osteoarthritis, sjogren's, and fibromyalgia.  She is taking plaquenil 200 mg 1 tablet by mouth twice daily.  She denies any signs or symptoms of a flare recently.  She continues to have chronic sicca symptoms.  She uses systane eye drops for symptomatic relief. She reports intermittent pain in both hands which she attributes to weather changes and occasional overuse activities.  She denies any increased joint swelling.  She continues to experience muscle tenderness and tender points in both lower extremities due to fibromyalgia.  She has ongoing insomnia and take lorazepam 4 mg at bedtime and tylenol PM at bedtime to help her sleep.  Patient reports that she will be evaluated by her dermatologist this afternoon for rash on her chest and left upper arm which she noticed several months ago.  She denies any increased photosensitivity.  She has not had any recent facial rashes.  She denies any symptoms of Raynaud's.   She reports she underwent a cholecystectomy in December 2021.  She has had ongoing diarrhea since then.  She states she was also diagnosed with gastroparesis by Dr. Benson Norway.  She states these GI issues have been interfering with her quality of life since she is limited to the foods she can eat and due to the frequent of diarrhea.  She reports she continues to follow up with Dr. Arnoldo Morale for chronic lower back pain.  Her most recent surgery was on 03/02/19 and had L5-S1 fused.  Her symptoms of sciatica resolved after surgery, but she continues to have intermittent symptoms of radiculopathy at night.  She started  physical therapy last week for weakness in upper and lower extremities.  She continues to have routine lab work on a yearly basis by hematology for management of MGUS.  She had Dr. Kerman Passey her neurologist check necessary lab work in 2020 due to limiting office visits during the pandemic.    Activities of Daily Living:  Patient reports morning stiffness for  30 minutes.   Patient Denies nocturnal pain.  Difficulty dressing/grooming: Denies Difficulty climbing stairs: Denies Difficulty getting out of chair: Denies Difficulty using hands for taps, buttons, cutlery, and/or writing: Reports  Review of Systems  Constitutional: Positive for fatigue.  HENT: Positive for mouth dryness. Negative for mouth sores and nose dryness.   Eyes: Positive for dryness. Negative for pain and visual disturbance.  Respiratory: Negative for cough, hemoptysis and difficulty breathing.   Cardiovascular: Positive for swelling in legs/feet. Negative for chest pain, palpitations and hypertension.  Gastrointestinal: Positive for constipation and diarrhea. Negative for blood in stool.  Genitourinary: Negative for painful urination.  Musculoskeletal: Positive for arthralgias, joint pain, joint swelling, muscle weakness, morning stiffness and muscle tenderness. Negative for myalgias and myalgias.  Skin: Positive for rash. Negative for color change, pallor, hair loss, nodules/bumps, skin tightness, ulcers and sensitivity to sunlight.  Allergic/Immunologic: Negative for susceptible to infections.  Neurological: Negative for dizziness and headaches.  Hematological: Negative for bruising/bleeding tendency and swollen glands.  Psychiatric/Behavioral: Positive  for sleep disturbance. Negative for depressed mood. The patient is not nervous/anxious.     PMFS History:  Patient Active Problem List   Diagnosis Date Noted  . History of lumbar surgery 09/16/2019  . Pseudoarthrosis of lumbar spine 03/02/2019  . Primary osteoarthritis  of both hands 04/10/2017  . Primary osteoarthritis of both knees 04/10/2017  . DDD (degenerative disc disease), lumbar 04/10/2017  . Mixed connective tissue disease ([place]) 04/10/2017  . S/P trigger finger release bilateral third 03/14/2017  . MGUS (monoclonal gammopathy of unknown significance) 03/11/2017  . History of chronic kidney disease 03/11/2017  . Monoclonal paraproteinemia 01/11/2016  . Restless leg 01/11/2015  . Carpal tunnel syndrome 12/28/2014  . Dysphagia, oropharyngeal 12/28/2014  . Anxiety, generalized 10/14/2014  . Generalized joint pain 08/17/2014  . Encounter for therapeutic drug monitoring 07/13/2014  . Failed back syndrome of lumbar spine 07/13/2014  . Encounter for therapeutic drug level monitoring 07/13/2014  . 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 Diabetes ([place]) 06/16/2014  . Fibrositis 06/16/2014  . Cannot sleep 06/16/2014  . Extreme obesity 06/16/2014  . Neuropathy 06/16/2014  . Allergic rhinitis, seasonal 06/16/2014  . Post menopausal syndrome 06/16/2014  . 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 Spinal stenosis 06/16/2014  . Adult hypothyroidism 06/16/2014  . 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 Diabetes mellitus ([place]) 06/16/2014  . Fibromyalgia 06/16/2014  . Morbid obesity ([place]) 06/16/2014  . Sjogren's syndrome ([place]) 06/16/2014  . Postmenopausal HRT (hormone replacement therapy) 06/16/2014  . Breath shortness 01/21/2014  . Essential (primary) hypertension 01/21/2014  . Acid reflux 01/21/2014  . Adiposity 01/21/2014  . Clinical depression 08/26/2013  . Obstructive apnea 01/17/2013  . Cushing's syndrome ([place]) 05/23/2011  . Pituitary adenoma ([place]) 05/23/2011    Past Medical History:  Diagnosis Date  . Anxiety   . Arthritis   . Cushing's disease ([place])    pituitary tumor removed 2012  . Diabetes mellitus    Endocrinologist Dr. Iran Planas  . Difficult intubation    2011 scratched trachea  . Dyspnea    upon exertion  .  Fibromyalgia   . Gastroparesis   . GERD (gastroesophageal reflux disease)   . Headache(784.0)   . Hypertension   . Hypothyroidism   . Lumbar disc disease   . MGUS (monoclonal gammopathy of unknown significance)   . Mixed connective tissue disease ([place])   . Neuropathy   . Pneumonia   . Sjogren's disease ([place])   . Sleep apnea    no cpap.sleep study 2006 [place]    Family History  Problem Relation Age of Onset  . Diabetes type II Mother   . Diabetes type II Father   . Heart disease Father   . Heart attack Father   . Colon cancer Maternal Grandmother   . Heart disease Maternal Grandfather    Past Surgical History:  Procedure Laterality Date  . APPENDECTOMY    . BACK SURGERY     lumbar fusion  . BIOPSY  01/15/2020   Procedure: BIOPSY;  Surgeon: Carol Ada, MD;  Location: [facility];  Service: Endoscopy;;  . BRAIN SURGERY     For cushings  . CARPAL TUNNEL RELEASE Bilateral   . CATARACT EXTRACTION, BILATERAL    . CESAREAN SECTION    . COLONOSCOPY    . COLONOSCOPY WITH PROPOFOL N/A 01/15/2020   Procedure: COLONOSCOPY WITH PROPOFOL;  Surgeon: Carol Ada, MD;  Location: [facility];  Service: Endoscopy;  Laterality: N/A;  . COMBINED HYSTEROSCOPY DIAGNOSTIC / D&C    .  ENDOMETRIAL ABLATION    . ESOPHAGOGASTRODUODENOSCOPY (EGD) WITH PROPOFOL N/A 01/15/2020   Procedure: ESOPHAGOGASTRODUODENOSCOPY (EGD) WITH PROPOFOL;  Surgeon: Carol Ada, MD;  Location: [facility];  Service: Endoscopy;  Laterality: N/A;  . ESOPHAGOGASTRODUODENOSCOPY ENDOSCOPY    . FRACTURE SURGERY     l arm  . LUMBAR LAMINECTOMY/DECOMPRESSION MICRODISCECTOMY  11/13/2011   Procedure: LUMBAR LAMINECTOMY/DECOMPRESSION MICRODISCECTOMY;  Surgeon: Faythe Ghee, MD;  Location: [facility];  Service: Neurosurgery;  Laterality: Bilateral;  Bilateral Lumbar four-five Decompression  . POLYPECTOMY  01/15/2020   Procedure: POLYPECTOMY;  Surgeon: Carol Ada, MD;  Location: [facility];  Service: Endoscopy;;  .  TONSILLECTOMY    . TRANSPHENOIDAL / TRANSNASAL HYPOPHYSECTOMY / RESECTION PITUITARY TUMOR    . TRIGGER FINGER RELEASE    . TUBAL LIGATION     Social History   Social History Narrative  . Not on file   Immunization History  Administered Date(s) Administered  . Influenza Inj Mdck Quad Pf 07/12/2017, 07/14/2018  . Influenza Split 06/13/2017  . Pneumococcal Conjugate-13 12/28/2014  . Pneumococcal Polysaccharide-23 01/03/2017     Objective: Vital Signs: BP (!) 156/65 (BP Location: Left Arm, Patient Position: Sitting, Cuff Size: Normal)   Pulse (!) 106   Resp 17   Ht 5' 3"  (1.6 m)   Wt 267 lb (121.1 kg)   LMP 11/08/2006   BMI 47.30 kg/m    Physical Exam Vitals and nursing note reviewed.  Constitutional:      Appearance: She is well-developed and well-nourished.  HENT:     Head: Normocephalic and atraumatic.  Eyes:     Extraocular Movements: EOM normal.     Conjunctiva/sclera: Conjunctivae normal.  Cardiovascular:     Pulses: Intact distal pulses.  Pulmonary:     Effort: Pulmonary effort is normal.  Abdominal:     Palpations: Abdomen is soft.  Musculoskeletal:     Cervical back: Normal range of motion.  Skin:    General: Skin is warm and dry.     Capillary Refill: Capillary refill takes less than 2 seconds.     Comments: Erythematous annular plaques noted on chest and left shoulder  Neurological:     Mental Status: She is alert and oriented to person, place, and time.  Psychiatric:        Mood and Affect: Mood and affect normal.        Behavior: Behavior normal.      Musculoskeletal Exam: C-spine limited ROM with lateral rotation.  Shoulder joints good ROM with some discomfort with internal rotation bilaterally.  Elbow joints, wrist joints, MCPs, PIPs, and DIPs good ROM with no synovitis.  PIP and DIP thickening consistent with osteoarthritis of both hands.  Knee joints good ROM with no warmth or effusion. Ankle joints good ROM with no tenderness or inflammation.    CDAI Exam: CDAI Score: -- Patient Global: --; Provider Global: -- Swollen: --; Tender: -- Joint Exam 07/28/2020   No joint exam has been documented for this visit   There is currently no information documented on the homunculus. Go to the Rheumatology activity and complete the homunculus joint exam.  Investigation: No additional findings.  Imaging: No results found.  Recent Labs: Lab Results  Component Value Date   WBC 5.7 02/03/2020   HGB 13.7 02/03/2020   PLT 200 02/03/2020   NA 139 02/03/2020   K 4.4 02/03/2020   CL 103 02/03/2020   CO2 25 02/03/2020   GLUCOSE 181 (H) 02/03/2020   BUN 12 02/03/2020  CREATININE 0.67 02/03/2020   BILITOT 0.4 02/03/2020   ALKPHOS 65 05/20/2019   AST 17 02/03/2020   ALT 17 02/03/2020   PROT 7.5 02/03/2020   ALBUMIN 3.7 05/20/2019   CALCIUM 9.7 02/03/2020   GFRAA 108 02/03/2020    Speciality Comments: PLQ eye exam: 02/09/19 Normal. [facility]. Follow up in 1 year  Procedures:  No procedures performed Allergies: Codeine, Methotrexate, Atorvastatin, and Quinapril hcl   Assessment / Plan:     Visit Diagnoses: Mixed connective tissue disease ([place]) - ANA 1:160 NS, RNP positive, inflammatory arthritis: She has not had any signs or symptoms of a flare recently.  She is clinically doing well on Plaquenil 200 mg 1 tablet by mouth twice daily.  She is tolerating Plaquenil without any side effects and has not missed any doses recently.  She has no synovitis on examination today.  She continues to experience intermittent pain and stiffness in both hands.  She has not had any symptoms of Raynaud's recently.  No digital ulcerations or signs of gangrene were noted.  She has erythematous annular plaques on her chest and left shoulder and will be evaluated by her dermatologist this afternoon.  She has not had any recent facial rashes.  We discussed the importance of wearing sunscreen SPF greater than 50 on a daily basis and reapplying every 2  hours.  She continues to have chronic sicca symptoms.  She uses Systane eyedrops as needed for symptomatic relief.  Lab work from 02/03/2020 was reviewed with the patient today in the office.  We will repeat the following lab work today.  She will continue taking Plaquenil as prescribed.  A refill of Plaquenil sent to the pharmacy.  She was advised to notify us if she develops any new or worsening symptoms.  She will follow-up in the office in 5 months- Plan: CBC with Differential/Platelet, COMPLETE METABOLIC PANEL WITH GFR, Urinalysis, Routine w reflex microscopic, Anti-DNA antibody, double-stranded, C3 and C4, Sedimentation rate, RNP Antibody, Rheumatoid factor, hydroxychloroquine (PLAQUENIL) 200 MG tablet  High risk medication use - Plaquenil 200 mg 1 tablet by mouth twice daily.  PLQ eye exam: 02/09/19.  She will schedule an updated Plaquenil eye exam.  She had recent cataract surgery and has a follow-up visit on 08/16/2020.  She was given a Plaquenil eye exam form to take with her to her upcoming appointment.  D/c MTX due to nausea and vomiting.  CBC and CMP were drawn on 02/03/2020.  Orders for CBC and CMP were released today.  She will require updated lab work every 5 months to monitor for drug toxicity.- Plan: CBC with Differential/Platelet, COMPLETE METABOLIC PANEL WITH GFR  Sjogren's syndrome with keratoconjunctivitis sicca ([place]) - Ro-, La-: She has ongoing sicca symptoms.  She uses systane eye drops for symptomatic relief.  She is clinically doing well on Plaquenil 200 mg 1 tablet by mouth twice daily.  We will check the following lab work today.  Plan: Sjogrens syndrome-A extractable nuclear antibody, Sjogrens syndrome-B extractable nuclear antibody, Rheumatoid factor  Muscle weakness -She started physical therapy last week on 07/19/20 for upper and lower extremity muscle strengthening. No difficulty rising from seated position or raising her arms above her head today in the office. CK ordered today to  further assess the generalized muscle weakness she is experiencing.  Plan: CK  Elevated sed rate - ESR elevated-36 on 02/03/20.  We will recheck sed rate today. Order released. Plan: Sedimentation rate  Rash: Erythematous annular plaques noted on  chest and left shoulder.  She noticed the rash several months ago. She denies any photosensitivity.  She states the rash has been itchy.  She has not applied any topical agents. She has an appointment today with dermatology for further evaluation.  She was advised to have OV notes faxed to [place] to review.  Skin thickening - Secondary to diabetes. She has a dermatology appointment today.  Primary osteoarthritis of both hands: She has PIP and DIP thickening consistent with osteoarthritis of both hands.  No tenderness or inflammation was noted.  She is able to make a complete fist bilaterally.  She experiences intermittent discomfort and stiffness in both hands which is typically exacerbated by overuse activities as well as weather changes.  Joint protection and muscle strengthening were discussed.  Primary osteoarthritis of both knees: She has good range of motion of both knee joints on examination today.  No warmth or effusion was noted.  She has fibromyalgia tender points on the medial aspect of both knees.  DDD (degenerative disc disease), lumbar - s/p laminectomy -Chronic pain.  She had a L5-S1 fusion on 03/02/2019 performed by Dr. Arnoldo Morale.  She continues to have chronic pain in the lumbar region.  She takes tylenol PM at bedtime.   Fibromyalgia: She has generalized hyperalgesia and positive tender points on examination today.  Her myalgias and tender points are more severe in bilateral lower extremities.  She continues to have chronic fatigue secondary to insomnia.  She has been taking Tylenol PM and Ativan 4 mg a mouth at bedtime as needed for insomnia.  She started physical therapy last week for generalized myalgias and muscle weakness.  Discussed the  importance of regular exercise and good sleep hygiene.  MGUS (monoclonal gammopathy of unknown significance) - She follows up yearly with hematology. According to the patient she has family history of multiple myeloma.  Declined SPEP today.  She will be having updated lab work in January 2022.   History of chronic kidney disease: Creatinine was 0.67 and GFR was 94 on 02/03/20.  CMP ordered today.   History of hypothyroidism: She is taking thyroid armour as prescribed.   History of depression: She continues to take wellbutin as prescribed.  History of Cushing disease: She follows up with her endocrinologist on a regular basis.   History of anxiety: She takes ativan 2 mg 1 to 2 tablets by mouth as needed.   History of diabetes mellitus: According to the patient she was recently diagnosed with gastroparesis by Dr. Benson Norway.   History of cholecystectomy: Performed December 2020.  She has persistent diarrhea.  She is followed by Dr. Benson Norway.   History of chronic pain: She takes tylenol PM at bedtime for pain relief.   Palpitations    Orders: Orders Placed This Encounter  Procedures  . CBC with Differential/Platelet  . COMPLETE METABOLIC PANEL WITH GFR  . Urinalysis, Routine w reflex microscopic  . Anti-DNA antibody, double-stranded  . C3 and C4  . Sedimentation rate  . RNP Antibody  . Sjogrens syndrome-A extractable nuclear antibody  . Sjogrens syndrome-B extractable nuclear antibody  . CK  . Rheumatoid factor   Meds ordered this encounter  Medications  . hydroxychloroquine (PLAQUENIL) 200 MG tablet    Sig: Take 1 tablet (200 mg total) by mouth 2 (two) times daily.    Dispense:  180 tablet    Refill:  0    Follow-Up Instructions: Return in about 5 months (around 12/26/2020) for Mixed connective tissue disease.   Lovena Le  Haskel Schroeder, PA-C  Note - This record has been created using [employer].  Chart creation errors have been sought, but may not always  have been located. Such  creation errors do not reflect on  the standard of medical care.

## 2020-07-28 ENCOUNTER — Encounter: Payer: Self-pay | Admitting: Physician Assistant

## 2020-07-28 ENCOUNTER — Ambulatory Visit (INDEPENDENT_AMBULATORY_CARE_PROVIDER_SITE_OTHER): Admitting: Physician Assistant

## 2020-07-28 ENCOUNTER — Other Ambulatory Visit: Payer: Self-pay

## 2020-07-28 ENCOUNTER — Ambulatory Visit: Admitting: Physician Assistant

## 2020-07-28 VITALS — BP 156/65 | HR 106 | Resp 17 | Ht 63.0 in | Wt 267.0 lb

## 2020-07-28 DIAGNOSIS — M797 Fibromyalgia: Secondary | ICD-10-CM

## 2020-07-28 DIAGNOSIS — Z79899 Other long term (current) drug therapy: Secondary | ICD-10-CM

## 2020-07-28 DIAGNOSIS — M51369 Other intervertebral disc degeneration, lumbar region without mention of lumbar back pain or lower extremity pain: Secondary | ICD-10-CM

## 2020-07-28 DIAGNOSIS — R21 Rash and other nonspecific skin eruption: Secondary | ICD-10-CM

## 2020-07-28 DIAGNOSIS — R002 Palpitations: Secondary | ICD-10-CM

## 2020-07-28 DIAGNOSIS — Z8639 Personal history of other endocrine, nutritional and metabolic disease: Secondary | ICD-10-CM

## 2020-07-28 DIAGNOSIS — M170 Bilateral primary osteoarthritis of knee: Secondary | ICD-10-CM

## 2020-07-28 DIAGNOSIS — M3501 Sicca syndrome with keratoconjunctivitis: Secondary | ICD-10-CM

## 2020-07-28 DIAGNOSIS — M19042 Primary osteoarthritis, left hand: Secondary | ICD-10-CM

## 2020-07-28 DIAGNOSIS — M5136 Other intervertebral disc degeneration, lumbar region: Secondary | ICD-10-CM

## 2020-07-28 DIAGNOSIS — R234 Changes in skin texture: Secondary | ICD-10-CM

## 2020-07-28 DIAGNOSIS — Z87448 Personal history of other diseases of urinary system: Secondary | ICD-10-CM

## 2020-07-28 DIAGNOSIS — Z8659 Personal history of other mental and behavioral disorders: Secondary | ICD-10-CM

## 2020-07-28 DIAGNOSIS — R700 Elevated erythrocyte sedimentation rate: Secondary | ICD-10-CM

## 2020-07-28 DIAGNOSIS — M6281 Muscle weakness (generalized): Secondary | ICD-10-CM

## 2020-07-28 DIAGNOSIS — M351 Other overlap syndromes: Secondary | ICD-10-CM

## 2020-07-28 DIAGNOSIS — Z9049 Acquired absence of other specified parts of digestive tract: Secondary | ICD-10-CM

## 2020-07-28 DIAGNOSIS — D472 Monoclonal gammopathy: Secondary | ICD-10-CM

## 2020-07-28 DIAGNOSIS — Z87898 Personal history of other specified conditions: Secondary | ICD-10-CM

## 2020-07-28 DIAGNOSIS — M19041 Primary osteoarthritis, right hand: Secondary | ICD-10-CM

## 2020-07-28 MED ORDER — HYDROXYCHLOROQUINE SULFATE 200 MG PO TABS: 200.0000 mg | ORAL_TABLET | Freq: Two times a day (BID) | ORAL | 0 refills | Status: DC

## 2020-07-29 LAB — URINALYSIS, ROUTINE W REFLEX MICROSCOPIC
Bilirubin Urine: NEGATIVE
Hgb urine dipstick: NEGATIVE
Ketones, ur: NEGATIVE
Leukocytes,Ua: NEGATIVE
Nitrite: NEGATIVE
Protein, ur: NEGATIVE
Specific Gravity, Urine: 1.026 (ref 1.001–1.03)
pH: 6 (ref 5.0–8.0)

## 2020-07-29 LAB — COMPLETE METABOLIC PANEL WITH GFR
AG Ratio: 1.2 (calc) (ref 1.0–2.5)
ALT: 16 U/L (ref 6–29)
AST: 22 U/L (ref 10–35)
Albumin: 3.9 g/dL (ref 3.6–5.1)
Alkaline phosphatase (APISO): 77 U/L (ref 37–153)
BUN: 14 mg/dL (ref 7–25)
CO2: 24 mmol/L (ref 20–32)
Calcium: 9.4 mg/dL (ref 8.6–10.4)
Chloride: 104 mmol/L (ref 98–110)
Creat: 0.73 mg/dL (ref 0.50–0.99)
GFR, Est African American: 101 mL/min/{1.73_m2} (ref >=60)
GFR, Est Non African American: 87 mL/min/{1.73_m2} (ref >=60)
Globulin: 3.3 g/dL (calc) (ref 1.9–3.7)
Glucose, Bld: 223 mg/dL — ABNORMAL HIGH (ref 65–99)
Potassium: 4.6 mmol/L (ref 3.5–5.3)
Sodium: 141 mmol/L (ref 135–146)
Total Bilirubin: 0.5 mg/dL (ref 0.2–1.2)
Total Protein: 7.2 g/dL (ref 6.1–8.1)

## 2020-07-29 LAB — CBC WITH DIFFERENTIAL/PLATELET
Absolute Monocytes: 396 cells/uL (ref 200–950)
Basophils Absolute: 50 cells/uL (ref 0–200)
Basophils Relative: 0.9 %
Eosinophils Absolute: 121 cells/uL (ref 15–500)
Eosinophils Relative: 2.2 %
HCT: 42.8 % (ref 35.0–45.0)
Hemoglobin: 13.6 g/dL (ref 11.7–15.5)
Lymphs Abs: 1106 cells/uL (ref 850–3900)
MCH: 27.3 pg (ref 27.0–33.0)
MCHC: 31.8 g/dL — ABNORMAL LOW (ref 32.0–36.0)
MCV: 85.9 fL (ref 80.0–100.0)
MPV: 10.7 fL (ref 7.5–12.5)
Monocytes Relative: 7.2 %
Neutro Abs: 3828 cells/uL (ref 1500–7800)
Neutrophils Relative %: 69.6 %
Platelets: 201 10*3/uL (ref 140–400)
RBC: 4.98 10*6/uL (ref 3.80–5.10)
RDW: 14.5 % (ref 11.0–15.0)
Total Lymphocyte: 20.1 %
WBC: 5.5 10*3/uL (ref 3.8–10.8)

## 2020-07-29 LAB — C3 AND C4
C3 Complement: 171 mg/dL (ref 83–193)
C4 Complement: 22 mg/dL (ref 15–57)

## 2020-07-29 LAB — SEDIMENTATION RATE: Sed Rate: 39 mm/h — ABNORMAL HIGH (ref 0–30)

## 2020-07-29 LAB — RNP ANTIBODY: Ribonucleic Protein(ENA) Antibody, IgG: <1 AI

## 2020-07-29 LAB — CK: Total CK: 71 U/L (ref 29–143)

## 2020-07-29 LAB — ANTI-DNA ANTIBODY, DOUBLE-STRANDED: ds DNA Ab: <1 IU/mL

## 2020-07-29 LAB — RHEUMATOID FACTOR: Rheumatoid fact SerPl-aCnc: <14 IU/mL (ref <14)

## 2020-07-29 LAB — SJOGRENS SYNDROME-A EXTRACTABLE NUCLEAR ANTIBODY: SSA (Ro) (ENA) Antibody, IgG: <1 AI

## 2020-07-29 LAB — SJOGRENS SYNDROME-B EXTRACTABLE NUCLEAR ANTIBODY: SSB (La) (ENA) Antibody, IgG: <1 AI

## 2020-07-29 NOTE — Progress Notes (Signed)
CBC WNL. Glucose is elevated-223. Rest of CMP WNL.  UA revealed 3+ glucose.    ESR remains mildly elevated but stable. Complements WNL.  DsDNA negative.  RNP and RF negative. Ro and La antibodies are negative.   CK WNL.  No change in therapy at this time.  Please advise the patient to have dermatologist fax [place] notes from yesterdays visit.

## 2020-09-20 ENCOUNTER — Telehealth: Payer: Self-pay | Admitting: Neurology

## 2020-09-20 ENCOUNTER — Ambulatory Visit: Admitting: Neurology

## 2020-09-20 ENCOUNTER — Other Ambulatory Visit: Payer: Self-pay | Admitting: Neurology

## 2020-09-20 NOTE — Telephone Encounter (Signed)
FYI: Pt called, cancel appt due to not feeling well, having congestion, cough. Pt said, will call back to reschedule.

## 2020-09-21 ENCOUNTER — Telehealth: Payer: Self-pay | Admitting: Neurology

## 2020-09-21 MED ORDER — LIDOCAINE 5 % EX OINT: 1.0000 "application " | TOPICAL_OINTMENT | CUTANEOUS | 3 refills | Status: DC | PRN

## 2020-09-21 NOTE — Telephone Encounter (Signed)
I went ahead and send in the prescription with a couple refills.  She should follow-up in the next couple months with [place] or else she could get medications like this from her primary care

## 2020-09-21 NOTE — Telephone Encounter (Signed)
Called pt back. Rescheduled for sooner appt on 11/07/20 at 1:30pm with Dr. Felecia Shelling, check in 1:00pm. Cx one made in June. She is experiencing congestion, cough, headache. Has telephone visit soon with PCP to be further evaluated for this. Aware Dr. Felecia Shelling called in rx w/ refills for her to pharmacy.

## 2020-09-21 NOTE — Telephone Encounter (Signed)
Dr. Felecia Shelling- are you okay to refill? Pt cx appt yesterday d/t not feeling well. Last seen 09/16/19. Rescheduled appt for 01/26/21

## 2020-09-21 NOTE — Addendum Note (Signed)
Addended by: Arlice Colt A on: 09/21/2020 12:02 PM   Modules accepted: Orders

## 2020-09-21 NOTE — Telephone Encounter (Signed)
Pt. is requesting a refill for lidocaine (XYLOCAINE) 5 % ointment.  Pharmacy: [facility] [phone]

## 2020-10-14 ENCOUNTER — Other Ambulatory Visit: Payer: Self-pay | Admitting: Rheumatology

## 2020-10-14 DIAGNOSIS — M351 Other overlap syndromes: Secondary | ICD-10-CM

## 2020-10-17 ENCOUNTER — Encounter: Payer: Self-pay | Admitting: *Deleted

## 2020-10-17 ENCOUNTER — Telehealth: Payer: Self-pay

## 2020-10-17 NOTE — Telephone Encounter (Signed)
PLQ eye exam scheduled 11/09/2020 with [facility], patient had cataract surgery, patient's mother passed away.

## 2020-10-17 NOTE — Telephone Encounter (Signed)
Last Visit: 07/28/2020 Next Visit: message sent to front desk to schedule appt, Return in about 5 months (around 12/26/2020) for Mixed connective tissue disease. Labs: 07/28/2020, CBC WNL. Glucose is elevated-223. Rest of CMP WNL. UA revealed 3+ glucose.   ESR remains mildly elevated but stable. Complements WNL. DsDNA negative. RNP and RF negative. Ro and La antibodies are negative.  CK WNL.  No change in therapy at this time.  Eye exam: [facility]/09/2019, [facility] for patient to have PLQ eye exam updated  Current Dose per office note 07/28/2020, Plaquenil 200 mg 1 tablet by mouth twice daily UO:RVIFB connective tissue disease  Last Fill: 07/28/2020  Okay to refill Plaquenil?

## 2020-10-17 NOTE — Telephone Encounter (Signed)
Opened in error

## 2020-10-17 NOTE — Telephone Encounter (Signed)
LMOM for patient to call and schedule 5 month follow-up appointment. 

## 2020-10-17 NOTE — Telephone Encounter (Signed)
Please call patient to schedule appt, Return in about 5 months (around 12/26/2020) for Mixed connective tissue disease.  Thank you.

## 2020-10-17 NOTE — Telephone Encounter (Signed)
Patient called stating she left a message at [facility] to schedule Plaquenil eye exam.  Patient states she will call back with date & time when they return her call.

## 2020-10-17 NOTE — Telephone Encounter (Signed)
Patient needs an eye examination as soon as possible.  Her last eye examination was June 2020.

## 2020-11-07 ENCOUNTER — Telehealth: Payer: Self-pay | Admitting: Neurology

## 2020-11-07 ENCOUNTER — Ambulatory Visit: Payer: Self-pay | Admitting: Neurology

## 2020-11-07 NOTE — Telephone Encounter (Signed)
FYI: Pt cancelled, not feeling well; having nausea, fever, diarhea. Rescheduled appt 12/26/20.

## 2020-11-09 LAB — HM DIABETES EYE EXAM

## 2020-12-23 NOTE — Progress Notes (Signed)
Office Visit Note  Patient: Gloria Porter             Date of Birth: 07-03-1956           MRN: [id number]             PCP: Florestine Avers (Inactive) Referring: No ref. provider found Visit Date: 01/05/2021 Occupation: @GUAROCC @  Subjective:  Medication monitoring   History of Present Illness: ASTON LAWHORN is a 65 y.o. female with history of mixed connective tissue disease, Sjogren's, and osteoarthritis.  Patient is taking Plaquenil 200 mg 1 tablet by mouth twice daily.  She continues to tolerate plaquenil without any side effects.  She denies any signs or symptoms of a flare.  She continues to have ongoing sicca symptoms.  She has been using systane and ACT products for symptomatic relief.  She denies any oral or nasal ulcerations.  Patient reports that she continues to see the dentist on a regular basis and denies any recent dental caries.  She has also been following up with her ophthalmologist as recommended.  She denies any facial rashes.  She states that her dermatologist diagnosed her with dermatitis and she was started on Marceline and several topical agents.  She has since discontinued Dupixent due to topical agents being effective at managing the dermatitis.  Patient reports that she continues to have intermittent pain and stiffness in both hands.  She applies heat to her hands which helps with some of her discomfort.  She continues to have chronic pain in both feet due to neuropathy.  She has occasional discomfort in both knee joints but denies any joint swelling.  She has been having some increased discomfort in her left hip but denies any groin pain or nocturnal pain at this time.  She continues to have chronic lower back pain which she attributes to having 3 previous back surgeries.  She reports she has been under a lot of stress over the past several month grieving the loss of her mother in December 2021.  She increased the dose of wellbutrin to 300 mg daily about 1 month ago  but has not noticed any improvement in her mood.  She has been working with a therapist on a weekly basis, so she has learned some helpful coping skills.  She continues to have chronic fatigue secondary to insomnia.  She was started on ambien 12.5 mg at bedtime for insomnia.      Activities of Daily Living:  Patient reports morning stiffness for 15-20 minutes.   Patient Denies nocturnal pain.  Difficulty dressing/grooming: Denies Difficulty climbing stairs: Denies Difficulty getting out of chair: Reports Difficulty using hands for taps, buttons, cutlery, and/or writing: Reports  Review of Systems  Constitutional: Positive for fatigue.  HENT: Positive for mouth dryness. Negative for mouth sores and nose dryness.   Eyes: Positive for itching and dryness. Negative for pain and visual disturbance.  Respiratory: Negative for cough, hemoptysis and difficulty breathing.   Cardiovascular: Negative for chest pain, palpitations and swelling in legs/feet.  Gastrointestinal: Positive for diarrhea. Negative for abdominal pain, blood in stool and constipation.  Endocrine: Negative for increased urination.  Genitourinary: Negative for painful urination.  Musculoskeletal: Positive for arthralgias, joint pain, joint swelling, muscle weakness and morning stiffness. Negative for myalgias, muscle tenderness and myalgias.  Skin: Positive for color change. Negative for rash and redness.  Allergic/Immunologic: Negative for susceptible to infections.  Neurological: Positive for numbness. Negative for dizziness, headaches, memory loss and weakness.  Hematological:  Negative for swollen glands.  Psychiatric/Behavioral: Negative for confusion and sleep disturbance.    PMFS History:  Patient Active Problem List   Diagnosis Date Noted  . History of lumbar surgery 09/16/2019  . Pseudoarthrosis of lumbar spine 03/02/2019  . Primary osteoarthritis of both hands 04/10/2017  . Primary osteoarthritis of both knees  04/10/2017  . DDD (degenerative disc disease), lumbar 04/10/2017  . Mixed connective tissue disease ([place]) 04/10/2017  . S/P trigger finger release bilateral third 03/14/2017  . MGUS (monoclonal gammopathy of unknown significance) 03/11/2017  . History of chronic kidney disease 03/11/2017  . Monoclonal paraproteinemia 01/11/2016  . Restless leg 01/11/2015  . Carpal tunnel syndrome 12/28/2014  . Dysphagia, oropharyngeal 12/28/2014  . Anxiety, generalized 10/14/2014  . Generalized joint pain 08/17/2014  . Encounter for therapeutic drug monitoring 07/13/2014  . Failed back syndrome of lumbar spine 07/13/2014  . Encounter for therapeutic drug level monitoring 07/13/2014  . 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 Diabetes ([place]) 06/16/2014  . Fibrositis 06/16/2014  . Cannot sleep 06/16/2014  . Extreme obesity 06/16/2014  . Neuropathy 06/16/2014  . Allergic rhinitis, seasonal 06/16/2014  . Post menopausal syndrome 06/16/2014  . 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 Spinal stenosis 06/16/2014  . Adult hypothyroidism 06/16/2014  . 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 Diabetes mellitus ([place]) 06/16/2014  . Fibromyalgia 06/16/2014  . Morbid obesity ([place]) 06/16/2014  . Sjogren's syndrome ([place]) 06/16/2014  . Postmenopausal HRT (hormone replacement therapy) 06/16/2014  . Breath shortness 01/21/2014  . Essential (primary) hypertension 01/21/2014  . Acid reflux 01/21/2014  . Adiposity 01/21/2014  . Depression with anxiety 08/26/2013  . OSA (obstructive sleep apnea) 01/17/2013  . 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 Pituitary adenoma ([place]) 05/23/2011    Past Medical History:  Diagnosis Date  . Anxiety   . Arthritis   . Cushing's disease ([place])    pituitary tumor removed 2012  . DDD (degenerative disc disease), lumbar   . Diabetes mellitus    Endocrinologist Dr. Iran Planas  . Difficult intubation    2011 scratched trachea  . Dyspnea    upon exertion  .  Fibromyalgia   . Gastroparesis   . GERD (gastroesophageal reflux disease)   . Headache(784.0)   . Hypertension   . Hypothyroidism   . Lumbar disc disease   . MGUS (monoclonal gammopathy of unknown significance)   . Mixed connective tissue disease ([place])   . Neuropathy   . Pneumonia   . Sjogren's disease ([place])   . Sleep apnea    no cpap.sleep study 2006 [place]    Family History  Problem Relation Age of Onset  . Diabetes type II Mother   . Diabetes type II Father   . Heart disease Father   . Heart attack Father   . Colon cancer Maternal Grandmother   . Heart disease Maternal Grandfather    Past Surgical History:  Procedure Laterality Date  . APPENDECTOMY    . BACK SURGERY     lumbar fusion  . BIOPSY  01/15/2020   Procedure: BIOPSY;  Surgeon: Carol Ada, MD;  Location: [facility];  Service: Endoscopy;;  . BRAIN SURGERY     For cushings  . CARPAL TUNNEL RELEASE Bilateral   . CATARACT EXTRACTION, BILATERAL    . CESAREAN SECTION  10/31/1973  . COLONOSCOPY    . COLONOSCOPY WITH PROPOFOL N/A 01/15/2020   Procedure: COLONOSCOPY WITH PROPOFOL;  Surgeon: Carol Ada, MD;  Location: [facility];  Service: Endoscopy;  Laterality: N/A;  .  COMBINED HYSTEROSCOPY DIAGNOSTIC / D&C    . ENDOMETRIAL ABLATION    . ESOPHAGOGASTRODUODENOSCOPY (EGD) WITH PROPOFOL N/A 01/15/2020   Procedure: ESOPHAGOGASTRODUODENOSCOPY (EGD) WITH PROPOFOL;  Surgeon: Carol Ada, MD;  Location: [facility];  Service: Endoscopy;  Laterality: N/A;  . ESOPHAGOGASTRODUODENOSCOPY ENDOSCOPY    . FRACTURE SURGERY     l arm  . LUMBAR LAMINECTOMY/DECOMPRESSION MICRODISCECTOMY  11/13/2011   Procedure: LUMBAR LAMINECTOMY/DECOMPRESSION MICRODISCECTOMY;  Surgeon: Faythe Ghee, MD;  Location: [facility];  Service: Neurosurgery;  Laterality: Bilateral;  Bilateral Lumbar four-five Decompression  . POLYPECTOMY  01/15/2020   Procedure: POLYPECTOMY;  Surgeon: Carol Ada, MD;  Location: [facility];  Service:  Endoscopy;;  . TONSILLECTOMY    . TRANSPHENOIDAL / TRANSNASAL HYPOPHYSECTOMY / RESECTION PITUITARY TUMOR    . TRIGGER FINGER RELEASE    . TUBAL LIGATION     Social History   Social History Narrative  . Not on file   Immunization History  Administered Date(s) Administered  . Influenza Inj Mdck Quad Pf 07/12/2017, 07/14/2018  . Influenza Split 06/13/2017  . Pneumococcal Conjugate-13 12/28/2014  . Pneumococcal Polysaccharide-23 01/03/2017     Objective: Vital Signs: BP 122/72 (BP Location: Left Arm, Patient Position: Sitting, Cuff Size: Normal)   Pulse 68   Ht 5' 2.5" (1.588 m)   Wt 261 lb 12.8 oz (118.8 kg)   LMP 11/08/2006   BMI 47.12 kg/m    Physical Exam Vitals and nursing note reviewed.  Constitutional:      Appearance: She is well-developed.  HENT:     Head: Normocephalic and atraumatic.  Eyes:     Conjunctiva/sclera: Conjunctivae normal.  Pulmonary:     Effort: Pulmonary effort is normal.  Abdominal:     Palpations: Abdomen is soft.  Musculoskeletal:     Cervical back: Normal range of motion.  Skin:    General: Skin is warm and dry.     Capillary Refill: Capillary refill takes less than 2 seconds.  Neurological:     Mental Status: She is alert and oriented to person, place, and time.  Psychiatric:        Behavior: Behavior normal.      Musculoskeletal Exam: C-spine limited ROM with lateral rotation.  Painful ROM of lumbar spine.  Shoulder joints, elbow joints, wrist joints, MCPs, PIPs, and DIPs good ROM with no synovitis.  Tenderness over the right 2nd PIP joint.  PIP and DIP thickening consistent with osteoarthritis of both hands.  Hip joints, knee joints, and ankle joints good ROM with no discomfort.  No tenderness to palpation over trochanteric bursa.  No warmth or effusion of knee joints.  No tenderness or joint swelling.  Mild pedal edema noted bilaterally.   CDAI Exam: CDAI Score: -- Patient Global: --; Provider Global: -- Swollen: --; Tender:  -- Joint Exam 01/05/2021   No joint exam has been documented for this visit   There is currently no information documented on the homunculus. Go to the Rheumatology activity and complete the homunculus joint exam.  Investigation: No additional findings.  Imaging: No results found.  Recent Labs: Lab Results  Component Value Date   WBC 5.5 07/28/2020   HGB 13.6 07/28/2020   PLT 201 07/28/2020   NA 141 07/28/2020   K 4.6 07/28/2020   CL 104 07/28/2020   CO2 24 07/28/2020   GLUCOSE 223 (H) 07/28/2020   BUN 14 07/28/2020   CREATININE 0.73 07/28/2020   BILITOT 0.5 07/28/2020   ALKPHOS 65 05/20/2019   AST 22  07/28/2020   ALT 16 07/28/2020   PROT 7.2 07/28/2020   ALBUMIN 3.7 05/20/2019   CALCIUM 9.4 07/28/2020   GFRAA 101 07/28/2020    Speciality Comments: PLQ eye exam: 11/09/2020 [facility] Follow up in 1 year  Procedures:  No procedures performed Allergies: Codeine, Methotrexate, Atorvastatin, and Quinapril hcl     Assessment / Plan:     Visit Diagnoses: Mixed connective tissue disease ([place]) -  ANA 1:160 NS, RNP positive, inflammatory arthritis: She has not had any signs or symptoms of a flare recently.  She has clinically been doing well taking Plaquenil 200 mg 1 tablet by mouth twice daily.  She continues to tolerate Plaquenil without any side effects.  She continues to experience intermittent arthralgias and joint stiffness but has no synovitis on examination today.  She has not had any recent facial rashes.  She continues to follow-up with her dermatologist on a regular basis.  She was briefly on Dupixent for management of dermatitis which she has since discontinued and has only been using topical agents which have managed her symptoms.  She has not had any oral or nasal ulcerations recently.  She continues to have chronic sicca symptoms which have been tolerable with over-the-counter products for symptomatic relief.  She has not had any symptoms of Raynaud's recently.   She has not had any pleuritic chest pain or palpitations.  Lab work from 07/28/2020 was reviewed with the patient today in the office: RNP negative, Ro-, La-, RF-, CK WNL, ESR 39, complements WNL, and dsDNA negative.  She is due to update the following lab work today.  Orders were released.  She will continue taking Plaquenil as prescribed.  She was advised to notify us if she develops any new or worsening symptoms.  She will follow-up in the office in 5 months.- Plan: CBC with Differential/Platelet, COMPLETE METABOLIC PANEL WITH GFR, Protein / creatinine ratio, urine, Anti-DNA antibody, double-stranded, C3 and C4, Sedimentation rate  High risk medication use - Plaquenil 200 mg 1 tablet by mouth twice daily.  PLQ eye exam: 11/09/2020 [facility] Follow up in 1 year.  CBC and CMP were drawn on 07/28/2020.  She is due to update lab work.  Orders for CBC and CMP were released.- Plan: CBC with Differential/Platelet, COMPLETE METABOLIC PANEL WITH GFR  Sjogren's syndrome with keratoconjunctivitis sicca ([place]) - Ro-, La-: She continues to have chronic sicca symptoms which have been tolerable overall.  She uses Systane eyedrops and ACT products for symptomatic relief.  She continues to see her dentist and ophthalmologist on a regular basis as recommended.  She will remain on Plaquenil as prescribed.  She was advised to notify us if she develops any new or worsening symptoms.  Primary osteoarthritis of both hands: She has PIP and DIP thickening consistent with osteoarthritis of both hands.  Tenderness to palpation over the right second PIP joint.  No synovitis was noted.  She was able to make a complete fist bilaterally.  Discussed the importance of joint protection and muscle strengthening.  She was encouraged to try arthritis compression gloves.  She was also given a handout of hand exercises to perform.  Primary osteoarthritis of both knees: She experiences occasional discomfort in both knee joints.  She has good  range of motion on examination today.  No warmth or effusion was noted.  She has bilateral knee crepitus.  DDD (degenerative disc disease), lumbar: She continues to have chronic lower back pain associated with 3 previous back surgeries.  Her symptoms of sciatica resolved after her last surgery but she continues to have some nocturnal pain and occasional symptoms of radiculopathy.  Fibromyalgia: She continues to experience intermittent myalgias and muscle tenderness due to underlying fibromyalgia.  She continues to have chronic fatigue secondary to insomnia.  She was recently started on Ambien 12.5 mg 1 tablet at bedtime to help her sleep.  She has had less interruptions in sleep since starting on Ambien. She has been grieving the loss of her mother since December 2021 which has caused some increased fatigue and generalized pain.  She has been seeing a therapist on a weekly basis which has been helpful.  She also increased the dose of Wellbutrin to 300 mg daily about 1 month ago but has not noticed much improvement in her mood with the dose change.  She was strongly encouraged to continue to follow-up with her therapist on a frequent basis.  Pain in left hip.  She has been experiencing intermittent discomfort in the left hip over the past several months.  She did not have any injury prior to the onset of symptoms.  On examination today she has good range of motion of the left hip joint with no discomfort.  She has not been experiencing any groin pain.  She has no tenderness palpation over the trochanteric bursa.  It is unclear if some of her discomfort may be referred pain from her lower back.  She declined x-rays at this time.  She was advised to notify us if her symptoms persist or worsen.  Other medical conditions are listed as follows:   MGUS (monoclonal gammopathy of unknown significance)  History of chronic kidney disease  History of hypothyroidism  History of depression  History of Cushing  disease  History of anxiety  History of diabetes mellitus  History of chronic pain  History of cholecystectomy  Orders: Orders Placed This Encounter  Procedures  . CBC with Differential/Platelet  . COMPLETE METABOLIC PANEL WITH GFR  . Protein / creatinine ratio, urine  . Anti-DNA antibody, double-stranded  . C3 and C4  . Sedimentation rate   No orders of the defined types were placed in this encounter.   Follow-Up Instructions: Return in about 5 months (around 06/07/2021) for Mixed connective tissue disease, Sjogren's syndrome, Osteoarthritis.   Ofilia Neas, PA-C  Note - This record has been created using Dragon software.  Chart creation errors have been sought, but may not always  have been located. Such creation errors do not reflect on  the standard of medical care.

## 2020-12-26 ENCOUNTER — Other Ambulatory Visit: Payer: Self-pay

## 2020-12-26 ENCOUNTER — Encounter: Payer: Self-pay | Admitting: Neurology

## 2020-12-26 ENCOUNTER — Ambulatory Visit (INDEPENDENT_AMBULATORY_CARE_PROVIDER_SITE_OTHER): Admitting: Neurology

## 2020-12-26 VITALS — BP 133/61 | HR 77 | Ht 63.0 in | Wt 257.0 lb

## 2020-12-26 DIAGNOSIS — G629 Polyneuropathy, unspecified: Secondary | ICD-10-CM

## 2020-12-26 DIAGNOSIS — D472 Monoclonal gammopathy: Secondary | ICD-10-CM

## 2020-12-26 DIAGNOSIS — G4733 Obstructive sleep apnea (adult) (pediatric): Secondary | ICD-10-CM

## 2020-12-26 DIAGNOSIS — G2581 Restless legs syndrome: Secondary | ICD-10-CM | POA: Diagnosis not present

## 2020-12-26 DIAGNOSIS — M351 Other overlap syndromes: Secondary | ICD-10-CM | POA: Diagnosis not present

## 2020-12-26 DIAGNOSIS — F418 Other specified anxiety disorders: Secondary | ICD-10-CM

## 2020-12-26 MED ORDER — LIDOCAINE 5 % EX OINT: 1.0000 "application " | TOPICAL_OINTMENT | CUTANEOUS | 11 refills | Status: DC | PRN

## 2020-12-26 NOTE — Progress Notes (Signed)
[facility]  PATIENT: Gloria Porter DOB: [birth year]  REFERRING DOCTOR OR PCP:  Idamae Schuller SOURCE: paitent  _________________________________   HISTORICAL  CHIEF COMPLAINT:  Chief Complaint  Patient presents with  . Follow-up    RM 13. Last seen 09/16/2019. Here to have follow up for continued med refills.    HISTORY OF PRESENT ILLNESS:  Gloria Porter is a 65 y.o. woman with neuropathy and fibromyalgia.    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  She uses a rollator at times due to her poor balance and she also has arthritis in hip and knees.   She hd seen Dr. Baird Cancer in [facility] for MGUS but has not seen x 2 years.   Horizant 600 mg po bid and lidocaine cream have helped some.  She uses special ice pack socks with benefit at night.     She also has MCTD and sees Dr. Estanislado Pandy.  She is on Plaquenil but the Methotrexate was stopped..   She rarely takes hydrocodone.  She had lumbar surgery in July 2022 (L4L5 re-fusion).   Standing a long time causes pain.  She saw Dr. Arnoldo Morale for refusion L4L5 and new fusion L5S1.   The left sciatic nerve/radicular issues improved.     She has had depression and is seeing therapy.   She found her mother deceased in her home 2020-08-22   She is on buproprion.   She has never been on Cymbalta.       She has a h/o OSA diagnosed in 2004.  Last PSG was about 2007.Marland Kitchen   She was using her old CPAP and she notes she does not snore when she uses it (according to her husband) but she does not feel her sleepiness is any.    For insomnia for zolpidem.    She has some daytime somnolence  EPWORTH SLEEPINESS SCALE  On a scale of 0 - 3 what is the chance of dozing:  Sitting and Reading:   1 Watching TV:    1 Sitting inactive in a public place: 0 Passenger in car for one hour: 3 Lying down to rest in the afternoon: 3 Sitting and talking to someone: 0 Sitting quietly after lunch:  3 In a car,  stopped in traffic:  0  Total (out of 24): 11/24   Mild excessive daytime sleepiness   She has FMS mostly causing pain in her legs.     Polyneuropathy/MGUS history:   She reports a burning dysesthetic pain and RLS.    Etiology is likely related to either IDDM (dx in 1991; IDDM since 2008) or MGUS.    She had an elevated M-spike on SPEP and is seeing Dr. Baird Cancer in [facility].    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    Bone survey was normal.   She has a family history of Multiple Myeloma    REVIEW OF SYSTEMS: Constitutional: No fevers, chills, sweats, or change in appetite.  Notes fatigue and sleepiness Eyes: No visual changes, double vision, eye pain Ear, nose and throat: No hearing loss, ear pain, nasal congestion, sore throat Cardiovascular: No chest pain, palpitations Respiratory: No shortness of breath at rest or with exertion.   No wheezes.  Has OSA GastrointestinaI: No nausea, vomiting, diarrhea, abdominal pain, fecal incontinence Genitourinary: Notes urgency and rare incontiennce Musculoskeletal: as above Integumentary: No rash, pruritus, skin lesions Neurological: as above Psychiatric: Notes depression and some anxiety Endocrine: No palpitations, diaphoresis, change in  appetite, change in weigh or increased thirst Hematologic/Lymphatic: No anemia, purpura, petechiae. Allergic/Immunologic: No itchy/runny eyes, nasal congestion, recent allergic reactions, rashes  ALLERGIES: Allergies  Allergen Reactions  . Codeine Nausea And Vomiting    Other reaction(s): Unknown  . Methotrexate Nausea And Vomiting and Other (See Comments)    Over sedation  . Atorvastatin     She cannot remember reaction  . Quinapril Hcl Cough    HOME MEDICATIONS:  Current Outpatient Medications:  .  ACCU-CHEK FASTCLIX LANCETS MISC, , Disp: , Rfl:  .  APPLE CIDER VINEGAR PO, Take 400 mg by mouth in the morning and at bedtime. , Disp: , Rfl:  .  betamethasone dipropionate (DIPROLENE) 0.05 % ointment, Apply 1  application topically as needed., Disp: , Rfl:  .  buPROPion (WELLBUTRIN XL) 150 MG 24 hr tablet, Take 300 mg by mouth daily., Disp: , Rfl:  .  Calcium Carb-Cholecalciferol (CALCIUM 1000 + D PO), Take 1 tablet by mouth daily. , Disp: , Rfl:  .  Cholecalciferol (VITAMIN D-3) 125 MCG (5000 UT) TABS, Take 5,000 Units by mouth daily. , Disp: , Rfl:  .  colestipol (COLESTID) 1 g tablet, Take 2 g by mouth 2 (two) times daily., Disp: , Rfl:  .  Dermatological Products, Misc. [facility]) lotion, Apply 1 application topically as needed., Disp: , Rfl:  .  diclofenac Sodium (VOLTAREN) 1 % GEL, Apply topically as needed., Disp: , Rfl:  .  dimenhyDRINATE (DRAMAMINE PO), Take 1 Dose by mouth as needed., Disp: , Rfl:  .  diphenhydramine-acetaminophen (TYLENOL PM) 25-500 MG TABS tablet, Take 3 tablets by mouth at bedtime as needed (pain/sleep)., Disp: , Rfl:  .  empagliflozin (JARDIANCE) 25 MG TABS tablet, Take 25 mg by mouth daily. , Disp: , Rfl:  .  FREESTYLE LITE test strip, daily., Disp: , Rfl:  .  furosemide (LASIX) 40 MG tablet, Take 40 mg by mouth daily., Disp: , Rfl:  .  Gabapentin Enacarbil (HORIZANT) 600 MG TBCR, Take 600 mg by mouth 2 (two) times daily., Disp: , Rfl:  .  Insulin Regular Human (HUMULIN R U-500, CONCENTRATED, [place]), Inject 1 Dose into the skin 3 (three) times daily as needed (for high blood sugar)., Disp: , Rfl:  .  Insulin Syringe-Needle U-100 31G X 5/16" 0.5 ML MISC, USE TO INJECT UP TO THREE TIMES A DAY, Disp: , Rfl:  .  levocetirizine (XYZAL) 5 MG tablet, Take 5 mg by mouth every evening., Disp: , Rfl:  .  loratadine (CLARITIN) 10 MG tablet, Take 10 mg by mouth daily., Disp: , Rfl:  .  LORazepam (ATIVAN) 2 MG tablet, Take 4 mg by mouth at bedtime., Disp: , Rfl:  .  losartan (COZAAR) 50 MG tablet, Take 50 mg by mouth daily after supper. , Disp: , Rfl:  .  montelukast (SINGULAIR) 10 MG tablet, Take 10 mg by mouth daily., Disp: , Rfl:  .  PLAQUENIL 200 MG tablet, TAKE 1 TABLET TWICE A  DAY, Disp: 180 tablet, Rfl: 0 .  promethazine (PHENERGAN) 25 MG tablet, Take 25 mg by mouth every 6 (six) hours as needed for nausea or vomiting., Disp: , Rfl:  .  Semaglutide (OZEMPIC, 0.25 OR 0.5 MG/DOSE, [place]), Inject 1 Dose into the skin once a week., Disp: , Rfl:  .  zolpidem (AMBIEN CR) 12.5 MG CR tablet, Take 12.5 mg by mouth at bedtime., Disp: , Rfl:  .  lidocaine (XYLOCAINE) 5 % ointment, Apply 1 application topically as needed., Disp: 50 g,  Rfl: 11  PAST MEDICAL HISTORY: Past Medical History:  Diagnosis Date  . Anxiety   . Arthritis   . 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 DDD (degenerative disc disease), lumbar   . Diabetes mellitus    Endocrinologist Dr. Iran Planas  . Difficult intubation    2011 scratched trachea  . Dyspnea    upon exertion  . Fibromyalgia   . Gastroparesis   . GERD (gastroesophageal reflux disease)   . Headache(784.0)   . Hypertension   . Hypothyroidism   . Lumbar disc disease   . MGUS (monoclonal gammopathy of unknown significance)   . Mixed connective tissue disease ([place])   . Neuropathy   . Pneumonia   . Sjogren's disease ([place])   . Sleep apnea    no cpap.sleep study 2006 [facility]    PAST SURGICAL HISTORY: Past Surgical History:  Procedure Laterality Date  . APPENDECTOMY    . BACK SURGERY     lumbar fusion  . BIOPSY  01/15/2020   Procedure: BIOPSY;  Surgeon: Carol Ada, MD;  Location: [facility];  Service: Endoscopy;;  . BRAIN SURGERY     For cushings  . CARPAL TUNNEL RELEASE Bilateral   . CATARACT EXTRACTION, BILATERAL    . CESAREAN SECTION  10/31/1973  . COLONOSCOPY    . COLONOSCOPY WITH PROPOFOL N/A 01/15/2020   Procedure: COLONOSCOPY WITH PROPOFOL;  Surgeon: Carol Ada, MD;  Location: [facility];  Service: Endoscopy;  Laterality: N/A;  . COMBINED HYSTEROSCOPY DIAGNOSTIC / D&C    . ENDOMETRIAL ABLATION    . ESOPHAGOGASTRODUODENOSCOPY (EGD) WITH PROPOFOL N/A 01/15/2020   Procedure:  ESOPHAGOGASTRODUODENOSCOPY (EGD) WITH PROPOFOL;  Surgeon: Carol Ada, MD;  Location: [facility];  Service: Endoscopy;  Laterality: N/A;  . ESOPHAGOGASTRODUODENOSCOPY ENDOSCOPY    . FRACTURE SURGERY     l arm  . LUMBAR LAMINECTOMY/DECOMPRESSION MICRODISCECTOMY  11/13/2011   Procedure: LUMBAR LAMINECTOMY/DECOMPRESSION MICRODISCECTOMY;  Surgeon: Faythe Ghee, MD;  Location: [facility];  Service: Neurosurgery;  Laterality: Bilateral;  Bilateral Lumbar four-five Decompression  . POLYPECTOMY  01/15/2020   Procedure: POLYPECTOMY;  Surgeon: Carol Ada, MD;  Location: [facility];  Service: Endoscopy;;  . TONSILLECTOMY    . TRANSPHENOIDAL / TRANSNASAL HYPOPHYSECTOMY / RESECTION PITUITARY TUMOR    . TRIGGER FINGER RELEASE    . TUBAL LIGATION      FAMILY HISTORY: Family History  Problem Relation Age of Onset  . Diabetes type II Mother   . Diabetes type II Father   . Heart disease Father   . Heart attack Father   . Colon cancer Maternal Grandmother   . Heart disease Maternal Grandfather     SOCIAL HISTORY:  Social History   Socioeconomic History  . Marital status: Married    Spouse name: Not on file  . Number of children: Not on file  . Years of education: Not on file  . Highest education level: Not on file  Occupational History  . Not on file  Tobacco Use  . Smoking status: Former Smoker    Packs/day: 1.00    Years: 32.00    Pack years: 32.00    Types: Cigarettes    Quit date: 07/27/2006    Years since quitting: 14.4  . Smokeless tobacco: Never Used  Vaping Use  . Vaping Use: Never used  Substance and Sexual Activity  . Alcohol use: Yes    Comment: rarely  . Drug use: No  . Sexual activity: Not on file  Other Topics Concern  . Not on file  Social History Narrative  . Not on file   Social Determinants of Health   Financial Resource Strain: Not on file  Food Insecurity: Not on file  Transportation Needs: Not on file  Physical Activity: Not on file  Stress:  Not on file  Social Connections: Not on file  Intimate Partner Violence: Not on file     PHYSICAL EXAM  Vitals:   12/26/20 1110  BP: 133/61  Pulse: 77  Weight: 257 lb (116.6 kg)  Height: 5' 3"  (1.6 m)    Body mass index is 45.53 kg/m.   General: The patient is an obese woman in NAD.      Skin: Extremities are without rash or edema.  Musculoskeletal: Mild tenderness over the classic fibromyalgia tender points of the upper chest and also inner thighs.  Mild joint enlargement in the hands     Neurologic Exam  Mental status: The patient is alert and oriented x 3 at the time of the examination. The patient has apparent normal recent and remote memory, with an apparently normal attention span and concentration ability.   Speech is normal.  Cranial nerves: Extraocular movements are full.   There is good facial sensation to soft touch bilaterally.Facial strength is normal.  Trapezius and sternocleidomastoid strength is normal. No dysarthria is noted.  Hearing is normal.  Motor:  Muscle bulk is normal.   Tone is normal. Strength is  5 / 5 in all 4 extremities.   Sensory: She has intact sensation to touch and vibration in the arms and hands.  She has reduced vibration sensation at the toes.  There is also dysesthesia over the left L5 distribution and decreased touch sensation in both feet.  Coordination: Cerebellar testing reveals good finger-nose-finger and heel-to-shin  Gait and station: Station is normal.   Gait is normal.  Tandem gait is mildly wide.  Romberg is negative.   Reflexes: Deep tendon reflexes are symmetric and 1+ bilaterally.        DIAGNOSTIC DATA (LABS, IMAGING, TESTING) - I reviewed patient records, labs, notes, testing and imaging myself where available.  Lab Results  Component Value Date   WBC 5.5 07/28/2020   HGB 13.6 07/28/2020   HCT 42.8 07/28/2020   MCV 85.9 07/28/2020   PLT 201 07/28/2020      Component Value Date/Time   NA 141 07/28/2020 1358    K 4.6 07/28/2020 1358   CL 104 07/28/2020 1358   CO2 24 07/28/2020 1358   GLUCOSE 223 (H) 07/28/2020 1358   BUN 14 07/28/2020 1358   CREATININE 0.73 07/28/2020 1358   CALCIUM 9.4 07/28/2020 1358   PROT 7.2 07/28/2020 1358   PROT 7.5 09/16/2019 1623   ALBUMIN 3.7 05/20/2019 0128   AST 22 07/28/2020 1358   ALT 16 07/28/2020 1358   ALKPHOS 65 05/20/2019 0128   BILITOT 0.5 07/28/2020 1358   GFRNONAA 87 07/28/2020 1358   GFRAA 101 07/28/2020 1358      ASSESSMENT AND PLAN  Neuropathy  Mixed connective tissue disease (HCC)  MGUS (monoclonal gammopathy of unknown significance)  Restless leg  Depression with anxiety    1.   Horizant and lidocaine ointment for the polyneuropathy.     2.   Flomax for bladder 3.   Stay active and exercises as tolerated.  4.   PSG and CPAP for OSA 5.   RTC 12 months, sooner if she has new or worsening neurologic symptoms.  45-minute  office visit with the majority of the time spent face-to-face for history and physical, discussion/counseling and decision-making.  Additional time with record review and documentation.   Albertina Leise A. Felecia Shelling, MD, PhD 2/70/0484, 98:65 PM Certified in Neurology, Clinical Neurophysiology, Sleep Medicine, Pain Medicine and Neuroimaging  [facility] [address] [place],  [address] [phone]  7

## 2021-01-05 ENCOUNTER — Other Ambulatory Visit: Payer: Self-pay

## 2021-01-05 ENCOUNTER — Encounter: Payer: Self-pay | Admitting: Physician Assistant

## 2021-01-05 ENCOUNTER — Ambulatory Visit (INDEPENDENT_AMBULATORY_CARE_PROVIDER_SITE_OTHER): Admitting: Physician Assistant

## 2021-01-05 VITALS — BP 122/72 | HR 68 | Ht 62.5 in | Wt 261.8 lb

## 2021-01-05 DIAGNOSIS — M19041 Primary osteoarthritis, right hand: Secondary | ICD-10-CM

## 2021-01-05 DIAGNOSIS — M351 Other overlap syndromes: Secondary | ICD-10-CM

## 2021-01-05 DIAGNOSIS — M51369 Other intervertebral disc degeneration, lumbar region without mention of lumbar back pain or lower extremity pain: Secondary | ICD-10-CM

## 2021-01-05 DIAGNOSIS — Z79899 Other long term (current) drug therapy: Secondary | ICD-10-CM | POA: Diagnosis not present

## 2021-01-05 DIAGNOSIS — Z9049 Acquired absence of other specified parts of digestive tract: Secondary | ICD-10-CM

## 2021-01-05 DIAGNOSIS — Z87448 Personal history of other diseases of urinary system: Secondary | ICD-10-CM

## 2021-01-05 DIAGNOSIS — M797 Fibromyalgia: Secondary | ICD-10-CM

## 2021-01-05 DIAGNOSIS — Z8639 Personal history of other endocrine, nutritional and metabolic disease: Secondary | ICD-10-CM

## 2021-01-05 DIAGNOSIS — M19042 Primary osteoarthritis, left hand: Secondary | ICD-10-CM

## 2021-01-05 DIAGNOSIS — M170 Bilateral primary osteoarthritis of knee: Secondary | ICD-10-CM

## 2021-01-05 DIAGNOSIS — Z8659 Personal history of other mental and behavioral disorders: Secondary | ICD-10-CM

## 2021-01-05 DIAGNOSIS — M3501 Sicca syndrome with keratoconjunctivitis: Secondary | ICD-10-CM

## 2021-01-05 DIAGNOSIS — Z87898 Personal history of other specified conditions: Secondary | ICD-10-CM

## 2021-01-05 DIAGNOSIS — D472 Monoclonal gammopathy: Secondary | ICD-10-CM

## 2021-01-05 DIAGNOSIS — M25552 Pain in left hip: Secondary | ICD-10-CM

## 2021-01-05 DIAGNOSIS — M5136 Other intervertebral disc degeneration, lumbar region: Secondary | ICD-10-CM

## 2021-01-05 NOTE — Patient Instructions (Signed)
Hand Exercises Hand exercises can be helpful for almost anyone. These exercises can strengthen the hands, improve flexibility and movement, and increase blood flow to the hands. These results can make work and daily tasks easier. Hand exercises can be especially helpful for people who have joint pain from arthritis or have nerve damage from overuse (carpal tunnel syndrome). These exercises can also help people who have injured a hand. Exercises Most of these hand exercises are gentle stretching and motion exercises. It is usually safe to do them often throughout the day. Warming up your hands before exercise may help to reduce stiffness. You can do this with gentle massage or by placing your hands in warm water for 10-15 minutes. It is normal to feel some stretching, pulling, tightness, or mild discomfort as you begin new exercises. This will gradually improve. Stop an exercise right away if you feel sudden, severe pain or your pain gets worse. Ask your health care provider which exercises are best for you. Knuckle bend or "claw" fist 1. Stand or sit with your arm, hand, and all five fingers pointed straight up. Make sure to keep your wrist straight during the exercise. 2. Gently bend your fingers down toward your palm until the tips of your fingers are touching the top of your palm. Keep your big knuckle straight and just bend the small knuckles in your fingers. 3. Hold this position for __________ seconds. 4. Straighten (extend) your fingers back to the starting position. Repeat this exercise 5-10 times with each hand. Full finger fist 1. Stand or sit with your arm, hand, and all five fingers pointed straight up. Make sure to keep your wrist straight during the exercise. 2. Gently bend your fingers into your palm until the tips of your fingers are touching the middle of your palm. 3. Hold this position for __________ seconds. 4. Extend your fingers back to the starting position, stretching every  joint fully. Repeat this exercise 5-10 times with each hand. Straight fist 1. Stand or sit with your arm, hand, and all five fingers pointed straight up. Make sure to keep your wrist straight during the exercise. 2. Gently bend your fingers at the big knuckle, where your fingers meet your hand, and the middle knuckle. Keep the knuckle at the tips of your fingers straight and try to touch the bottom of your palm. 3. Hold this position for __________ seconds. 4. Extend your fingers back to the starting position, stretching every joint fully. Repeat this exercise 5-10 times with each hand. Tabletop 1. Stand or sit with your arm, hand, and all five fingers pointed straight up. Make sure to keep your wrist straight during the exercise. 2. Gently bend your fingers at the big knuckle, where your fingers meet your hand, as far down as you can while keeping the small knuckles in your fingers straight. Think of forming a tabletop with your fingers. 3. Hold this position for __________ seconds. 4. Extend your fingers back to the starting position, stretching every joint fully. Repeat this exercise 5-10 times with each hand. Finger spread 1. Place your hand flat on a table with your palm facing down. Make sure your wrist stays straight as you do this exercise. 2. Spread your fingers and thumb apart from each other as far as you can until you feel a gentle stretch. Hold this position for __________ seconds. 3. Bring your fingers and thumb tight together again. Hold this position for __________ seconds. Repeat this exercise 5-10 times with each hand.   Making circles 1. Stand or sit with your arm, hand, and all five fingers pointed straight up. Make sure to keep your wrist straight during the exercise. 2. Make a circle by touching the tip of your thumb to the tip of your index finger. 3. Hold for __________ seconds. Then open your hand wide. 4. Repeat this motion with your thumb and each finger on your  hand. Repeat this exercise 5-10 times with each hand. Thumb motion 1. Sit with your forearm resting on a table and your wrist straight. Your thumb should be facing up toward the ceiling. Keep your fingers relaxed as you move your thumb. 2. Lift your thumb up as high as you can toward the ceiling. Hold for __________ seconds. 3. Bend your thumb across your palm as far as you can, reaching the tip of your thumb for the small finger (pinkie) side of your palm. Hold for __________ seconds. Repeat this exercise 5-10 times with each hand. Grip strengthening 1. Hold a stress ball or other soft ball in the middle of your hand. 2. Slowly increase the pressure, squeezing the ball as much as you can without causing pain. Think of bringing the tips of your fingers into the middle of your palm. All of your finger joints should bend when doing this exercise. 3. Hold your squeeze for __________ seconds, then relax. Repeat this exercise 5-10 times with each hand.   Contact a health care provider if:  Your hand pain or discomfort gets much worse when you do an exercise.  Your hand pain or discomfort does not improve within 2 hours after you exercise. If you have any of these problems, stop doing these exercises right away. Do not do them again unless your health care provider says that you can. Get help right away if:  You develop sudden, severe hand pain or swelling. If this happens, stop doing these exercises right away. Do not do them again unless your health care provider says that you can. This information is not intended to replace advice given to you by your health care provider. Make sure you discuss any questions you have with your health care provider. Document Revised: 11/20/2018 Document Reviewed: 07/31/2018 Elsevier Patient Education  2021 [employer]  

## 2021-01-06 LAB — CBC WITH DIFFERENTIAL/PLATELET
Absolute Monocytes: 475 cells/uL (ref 200–950)
Basophils Absolute: 52 cells/uL (ref 0–200)
Basophils Relative: 0.8 %
Eosinophils Absolute: 182 cells/uL (ref 15–500)
Eosinophils Relative: 2.8 %
HCT: 41.5 % (ref 35.0–45.0)
Hemoglobin: 13.1 g/dL (ref 11.7–15.5)
Lymphs Abs: 1242 cells/uL (ref 850–3900)
MCH: 27.5 pg (ref 27.0–33.0)
MCHC: 31.6 g/dL — ABNORMAL LOW (ref 32.0–36.0)
MCV: 87.2 fL (ref 80.0–100.0)
MPV: 10.6 fL (ref 7.5–12.5)
Monocytes Relative: 7.3 %
Neutro Abs: 4550 cells/uL (ref 1500–7800)
Neutrophils Relative %: 70 %
Platelets: 193 10*3/uL (ref 140–400)
RBC: 4.76 10*6/uL (ref 3.80–5.10)
RDW: 14.8 % (ref 11.0–15.0)
Total Lymphocyte: 19.1 %
WBC: 6.5 10*3/uL (ref 3.8–10.8)

## 2021-01-06 LAB — COMPLETE METABOLIC PANEL WITH GFR
AG Ratio: 1.2 (calc) (ref 1.0–2.5)
ALT: 24 U/L (ref 6–29)
AST: 26 U/L (ref 10–35)
Albumin: 4 g/dL (ref 3.6–5.1)
Alkaline phosphatase (APISO): 82 U/L (ref 37–153)
BUN: 18 mg/dL (ref 7–25)
CO2: 28 mmol/L (ref 20–32)
Calcium: 9.1 mg/dL (ref 8.6–10.4)
Chloride: 102 mmol/L (ref 98–110)
Creat: 0.61 mg/dL (ref 0.50–0.99)
GFR, Est African American: 111 mL/min/{1.73_m2} (ref >=60)
GFR, Est Non African American: 96 mL/min/{1.73_m2} (ref >=60)
Globulin: 3.3 g/dL (calc) (ref 1.9–3.7)
Glucose, Bld: 136 mg/dL — ABNORMAL HIGH (ref 65–99)
Potassium: 4.1 mmol/L (ref 3.5–5.3)
Sodium: 140 mmol/L (ref 135–146)
Total Bilirubin: 0.5 mg/dL (ref 0.2–1.2)
Total Protein: 7.3 g/dL (ref 6.1–8.1)

## 2021-01-06 LAB — C3 AND C4
C3 Complement: 175 mg/dL (ref 83–193)
C4 Complement: 22 mg/dL (ref 15–57)

## 2021-01-06 LAB — PROTEIN / CREATININE RATIO, URINE
Creatinine, Urine: 10 mg/dL — ABNORMAL LOW (ref 20–275)
Total Protein, Urine: <4 mg/dL — ABNORMAL LOW (ref 5–24)

## 2021-01-06 LAB — ANTI-DNA ANTIBODY, DOUBLE-STRANDED: ds DNA Ab: <1 IU/mL

## 2021-01-06 LAB — SEDIMENTATION RATE: Sed Rate: 31 mm/h — ABNORMAL HIGH (ref 0–30)

## 2021-01-06 NOTE — Progress Notes (Signed)
Glucose is elevated-136. Rest of CMP WNL.  CBC WNL.  Complements WNL.  ESR remains borderline elevated but has improved.  dsDNA is negative.  No evidence of proteinuria.  No change in therapy at this time.

## 2021-01-26 ENCOUNTER — Ambulatory Visit: Admitting: Neurology

## 2021-02-03 ENCOUNTER — Other Ambulatory Visit: Payer: Self-pay

## 2021-02-03 ENCOUNTER — Ambulatory Visit (INDEPENDENT_AMBULATORY_CARE_PROVIDER_SITE_OTHER): Admitting: Neurology

## 2021-02-03 DIAGNOSIS — G4733 Obstructive sleep apnea (adult) (pediatric): Secondary | ICD-10-CM

## 2021-02-04 NOTE — Progress Notes (Signed)
PATIENT'S NAME:  Gloria Porter, Gloria Porter DOB:      02/03/2021      MR#:    [id number]     DATE OF RECORDING: 02/03/2021 REFERRING M.D.:  Idamae Schuller, MD Study Performed:   Baseline Polysomnogram HISTORY:  Gloria Porter is a 65 y.o. woman with neuropathy and fibromyalgia.  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   She has a h/o OSA diagnosed in 2004.  Last PSG was about 2007. She was using her old CPAP and she notes she does not snore when she uses it (according to her husband) but she does not feel her sleepiness is any better.  She has some daytime somnolence The patient endorsed the Epworth Sleepiness Scale at 11 points.    The patient's weight 257 pounds with a height of 63 (inches), resulting in a BMI of 45.7 kg/m2.  The patient's neck circumference measured  inches.  CURRENT MEDICATIONS: Apple cider vinegar, Diprolene, Wellbutrin, calcium, Vitamin D3, Colestid, Voltaren, Dramamine, Tylenol PM, Jardiance, Lasix, Horizant, Humulin, Xyzal, Claritin, Ativan, Cozaar, Singulair, Plaquenil, Phenergan, Ozempic, Ambien CR, Xylocaine   PROCEDURE:  This is a multichannel digital polysomnogram utilizing the Somnostar 11.2 system.  Electrodes and sensors were applied and monitored per AASM Specifications.   EEG, EOG, Chin and Limb EMG, were sampled at 200 Hz.  ECG, Snore and Nasal Pressure, Thermal Airflow, Respiratory Effort, CPAP Flow and Pressure, Oximetry was sampled at 50 Hz. Digital video and audio were recorded.      BASELINE STUDY  Lights Out was at 22:04 and Lights On at 04:52.  Total recording time (TRT) was 408 minutes, with a total sleep time (TST) of 323 minutes.   The patient's sleep latency was 40 minutes.  REM latency was 279 minutes.  The sleep efficiency was 79.2 %.     SLEEP ARCHITECTURE: WASO (Wake after sleep onset) was 41 minutes.  There were 18.5 minutes in Stage N1, 239 minutes Stage N2, 46 minutes Stage N3 and 19.5 minutes in Stage REM.  The percentage of Stage N1 was  5.7%, Stage N2 was 74.%, Stage N3 was 14.2% and Stage R (REM sleep) was 6.%.  The arousals were noted as: 19 were spontaneous, 7 were associated with PLMs, 0 were associated with respiratory events.  RESPIRATORY ANALYSIS:  There were a total of 6 respiratory events:  0 obstructive apneas, 0 central apneas and 0 mixed apneas with a total of 0 apneas and an apnea index (AI) of 0 /hour. There were 6 hypopneas with a hypopnea index of 1.1 /hour. The patient also had 0 respiratory event related arousals (RERAs).      The total APNEA/HYPOPNEA INDEX (AHI) was 1.1/hour and the total RESPIRATORY DISTURBANCE INDEX was  1.1 /hour.  6 events occurred in REM sleep and 0 events in NREM. The REM AHI was  18.5 /hour, versus a non-REM AHI of 0. The patient spent 0 minutes of total sleep time in the supine position and 323 minutes in non-supine.. The supine AHI was 0.0 versus a non-supine AHI of 1.1.  OXYGEN SATURATION & C02:  The Wake baseline 02 saturation was 91%, with the lowest being 78%. Time spent below 89% saturation equaled 33 minutes.  PERIODIC LIMB MOVEMENTS:   The patient had a total of 205 Periodic Limb Movements.  The Periodic Limb Movement (PLM) index was 38.1 and the PLM Arousal index was 1.3/hour.  OTHER Audio and video analysis did not show any abnormal or unusual movements, behaviors, phonations or  vocalizations.   The patient took two bathroom breaks. Snoring was noted. EKG showed normal sinus rhythm (NSR).  Post-study, the patient indicated that sleep was the shorter than usual.   IMPRESSION:  There was no significant overall OSA (AHI = 1.1/hr) though she had moderate REM associated OSA (REM OSA = 18/hr) Nocturnal hypoxemia with a mean SaO2% = 91% with 33 minutes below SaO2 89% Moderate periodic limb movements of sleep but negligible impact on sleep (PLMS arousal index only 1.3/hr) Delayed REM latency could be a medicine effect.    RECOMMENDATIONS: Consider weight loss and/or an oral  appliance for snoring Consider nocturnal oxygen (2 L [place]) If troubling, consider treatment of periodic limb movements of sleep Follow-up with Dr. Felecia Shelling   I certify that I have reviewed the entire raw data recording prior to the issuance of this report in accordance with the Standards of Accreditation of the [employer] (AASM)   Crixus Mcaulay A. Felecia Shelling, MD, PhD, FAAN Certified in Neurology, Clinical Neurophysiology, Sleep Medicine, Pain Medicine and Neuroimaging Director, [place] [facility] [address] [place], [place] [address] [phone]    Demographics and Medical History           Name: Gloria Porter, Gloria Porter Age: [age] BMI: 45.7 Interp Physician: R. Felecia Shelling, MD  DOB: 02/03/2021 Ht-IN: 63 CM: 160 Referred By: Idamae Schuller, MD  Pt. Tag:  Wt-LB: 257 KG: 117 Tested By: Jerilynn Mages. Tommie Raymond, RPSGT  Pt. #: [id number] Sex:  Scored By: Jerilynn Mages. Tommie Raymond, RPSGT  Bed Tag:  Race:     Sleep Summary    Sleep Time Statistics Minutes Hours    Time in Bed 408    6.8    Total Sleep Time 323    5.4    Total Sleep Time NREM 303.5    5.1    Total Sleep Time REM 19.5    0.3    Sleep Onset 37    0.6    Wake After Sleep Onset 41    0.7    Wake After Sleep Period 7    0.1    Latency Persistent Sleep 40    0.7    Sleep Efficiency 79.2 Percent    Lights out 22:04     Lights on 04:52    Sleep Disruption Events Count Index    Arousals 28 5.2    Awakenings 0 0    Arousals + Awakenings 28 5.2    REM Awakenings 1 0.2     Sleep Stage Statistics Wake N1 N2 N3 REM    Percent Stage to SPT 11.3  5.1  65.7  12.6  5.4  Percent   Sleep Period Time in Stage 41 18.5 239 46 19.5 Minutes   Latency to Stage  37 39 108 279 Minutes   Percent Stage to TST  5.7 74. 14.2 6. Percent   EKG Summary          EKG Statistics         Heart Rate, Wake 79.5 BPM  TST Epochs in HR Interval 0 < 29   Heart Rate, Steady Sleep Avg 81  BPM   0 30-59   PAC Events 0 Count   348 60-79   PVC Events 0 Count   273 80-99   Bradycardia 0 Count   25 100-119   Tachycardia 0 Count   0 120-139        0 140-159    NREM  REM   0 > 160   Shortest R-R .5 .6       Longest R-R .9 .9        Respiration Summary  Event Statistics Total  With Arousal  With Awakening    Count Index  Count Index  Count Index   Apneas, Total 0 0  0 0.0   0 0.0    Hypopneas, Total 6 1.1  0 0.0   0 0.0    Apnea + Hypopnea Index 6 1.1   0 0.0   0 0.0    Apneas, Supine 0 0     Apneas, Non Supine 0 0     Hypopneas, Supine 0 0     Hypopneas, Non Supine 6 1.1     % Sleep Apnea 0 Percent     % Sleep Hypopnea .5 Percent    Oximetry Statistics       SpO2, Mean Wake 91 Percent     SpO2, Minimum 78 Percent     SpO2, Max 91 Percent     SpO2, Mean 89 Percent            Desaturation Index, REM 18.5  Index     Desaturation Index, NREM 0.0  Index     Desaturation Index, Total 1.1  Index             SpO2 Intervals > 89% 80-89% 70-79% 60-69% 50-59% 40-49% 30-39% < 30%  323 Percent Sleep Time 22.8 77.1 0.2 0 0 0 0 0  Body Position Statistics   Back Side L Side R Side Prone    Total Sleep Time   0 323.0 118.5 204.5 0 Minutes   Percent Time to TST   0.0  100.0  36.7  63.3  0.0  Percent   Number of Events   0 6.0 0 6 0 Count   Number of Apneas   0 0 0 0 0 Count   Number of Hypopneas   0 6 0 6 0 Count   Apnea Index   0.0  0.0  0.0  0.0  0.0  Index   Hypopnea Index   0.0  1.1  0.0  1.8  0.0  Index   Apnea + Hypopnea Index   0.0  1.1  0.0  1.8  0.0  Index  Respiration Events    Non REM, Pre Rx Statistics Non Supine  Supine    Central Mixed Obstr  Central Mixed Obstr   Apneas 0 0 0  0 0 0 Count  Apneas, Minimum SpO2 0 0 0  0 0 0 Percent     Hypopneas 0 0 0  0 0 0 Count  Hypopneas, Minimum SpO2 0 0 0  0 0 0 Percent     Apnea + Hypopneas Index 0.0 0.0 0.0  0.0 0.0 0.0 Index    REM, Pre Rx Statistics Non Supine  Supine    Central  Mixed Obstr  Central Mixed Obstr   Apneas 0 0 0  0 0 0 Count  Apneas, Minimum SpO2 0 0 0  0 0 0 Percent     Hypopneas 0 0 6  0 0 0 Count  Hypopneas, Minimum SpO2 0 0 78  0 0 0 Percent     Apnea + Hypopnea Index 0.0 0.0 18.5  0.0 0.0 0.0 Index  Leg Movement Summary    PLM Non REM (Incl. Wake) REM Total    No Arousal Arousal Wake No Arousal Arousal Wake No  Arousal Arousal Wake Total   Isolated 7 3 0 0 0 0 7 3 0 10    PLMS 198 7 0 0 0 0 198 7 0 205    Total 205 10 0 0 0 0 205 10 0 215   PLM Statistics PLMS Total     Count Index Count Index    PLM 205 38.1 215 39.9     PLM with Arousal 7 1.3 10 1.9     PLM, with Wake 0 0 0 0.0     PLM, Arousal + Wake 7 1.3 10 1.9     PLM, No Arousal 198 36.8  205 38.1     PLM, Non REM 205 40.5  215 42.5     PLM, REM 0 0.0  0 0.0     Technician Comments:  Patient arrived for a diagnostic polysomnogram. Procedure explained and all questions answered. Standard paste setup, without complications. Patient slept on right, on left, and supine during study. PLMS noted without significant associated arousals. Mild snoring noted. Respiratory events observed, primarily in REM sleep. No obvious cardiac arrhythmias noted. Two restroom visits.

## 2021-02-27 ENCOUNTER — Other Ambulatory Visit: Payer: Self-pay | Admitting: Rheumatology

## 2021-02-27 ENCOUNTER — Other Ambulatory Visit: Payer: Self-pay | Admitting: Neurology

## 2021-02-27 DIAGNOSIS — M351 Other overlap syndromes: Secondary | ICD-10-CM

## 2021-02-27 NOTE — Telephone Encounter (Signed)
Last Visit: 01/05/2021 Next Visit: Message sent to front desk, Return in about 5 months (around 06/07/2021) for Mixed connective tissue disease, Sjogren's syndrome, Osteoarthritis Labs: 01/05/2021, Glucose is elevated-136. Rest of CMP WNL.  CBC WNL.  Complements WNL.  ESRremains borderline elevated but has improved.  dsDNA is negative.  No evidence of proteinuria.  No change in therapy at this time.  Eye exam: 11/09/2020   Current Dose per office note 01/05/2021: Plaquenil 200 mg 1 tablet by mouth twice daily DX: Mixed connective tissue disease  Last Fill: 10/17/2020  Okay to refill Plaquenil?

## 2021-02-27 NOTE — Telephone Encounter (Signed)
Please schedule appt. Thank you.  Return in about 5 months (around 06/07/2021) for Mixed connective tissue disease, Sjogren's syndrome, Osteoarthriti

## 2021-03-06 ENCOUNTER — Telehealth: Payer: Self-pay | Admitting: Neurology

## 2021-03-06 NOTE — Telephone Encounter (Signed)
Called pt. Relayed Dr. Garth Bigness message. She verbalized understanding. She saw PCP office [facility]) last week d/t ongoing difficulty breathing/catching breath. CXR clear, showing scarring. Doppler on legs ok. Has ECHO 03/31/21. Referred to Pulmonology, Dr. Chase Caller. Waiting on call to get scheduled. Advised she can discuss getting set up w/ O2 per Dr. Garth Bigness recommendation when she goes for initial consult. They will be able to see Dr. Garth Bigness note. She verbalized understanding.

## 2021-03-06 NOTE — Telephone Encounter (Signed)
Pt states since her sleep study on 06-24 she has not been contacted with results, please call.

## 2021-03-20 ENCOUNTER — Encounter

## 2021-03-24 ENCOUNTER — Ambulatory Visit: Admitting: Pulmonary Disease

## 2021-03-24 ENCOUNTER — Encounter

## 2021-05-24 NOTE — Progress Notes (Signed)
Office Visit Note  Patient: Gloria Porter             Date of Birth: [birth year]           MRN: [id number]             PCP: Florestine Avers (Inactive) Referring: No ref. provider found Visit Date: 06/06/2021 Occupation: @GUAROCC @  Subjective:  Right knee pain   History of Present Illness: Gloria Porter is a 65 y.o. female with a history of mixed connective tissue disease, Sjogren's and osteoarthritis.  She states she has been experiencing increased fatigue recently.  She was evaluated by Dr. Felecia Shelling and had another sleep study.  She was advised to use 2 L of oxygen at nighttime.  She was also evaluated by pulmonologist at [facility].  According the patient she had chest x-ray which was unremarkable.  She also had pulmonary function tests which were normal.  She continues to have some joint pain and stiffness in her hands.  She has been experiencing increased pain in her right knee joint for the last 2 months.  She has not noticed any joint swelling.  She continues to have dry mouth and dry eye symptoms.  Activities of Daily Living:  Patient reports morning stiffness for 20-30 minutes.   Patient Denies nocturnal pain.  Difficulty dressing/grooming: Denies Difficulty climbing stairs: Reports Difficulty getting out of chair: Denies Difficulty using hands for taps, buttons, cutlery, and/or writing: Reports  Review of Systems  Constitutional:  Positive for fatigue.  HENT:  Positive for nose dryness. Negative for mouth sores and mouth dryness.   Eyes:  Positive for pain, itching and dryness.  Respiratory:  Positive for shortness of breath. Negative for difficulty breathing.   Cardiovascular:  Negative for chest pain and palpitations.  Gastrointestinal:  Positive for constipation and diarrhea. Negative for blood in stool.  Endocrine: Negative for increased urination.  Genitourinary:  Negative for difficulty urinating.  Musculoskeletal:  Positive for joint pain, joint  pain, joint swelling, myalgias, morning stiffness, muscle tenderness and myalgias.  Skin:  Positive for color change. Negative for rash, redness and sensitivity to sunlight.  Allergic/Immunologic: Negative for susceptible to infections.  Neurological:  Positive for numbness. Negative for dizziness, headaches and memory loss.  Hematological:  Negative for bruising/bleeding tendency and swollen glands.  Psychiatric/Behavioral:  Negative for confusion.    PMFS History:  Patient Active Problem List   Diagnosis Date Noted   History of lumbar surgery 09/16/2019   Pseudoarthrosis of lumbar spine 03/02/2019   Primary osteoarthritis of both hands 04/10/2017   Primary osteoarthritis of both knees 04/10/2017   DDD (degenerative disc disease), lumbar 04/10/2017   Mixed connective tissue disease ([place]) 04/10/2017   S/P trigger finger release bilateral third 03/14/2017   MGUS (monoclonal gammopathy of unknown significance) 03/11/2017   History of chronic kidney disease 03/11/2017   Monoclonal paraproteinemia 01/11/2016   Restless leg 01/11/2015   Carpal tunnel syndrome 12/28/2014   Dysphagia, oropharyngeal 12/28/2014   Anxiety, generalized 10/14/2014   Generalized joint pain 08/17/2014   Encounter for therapeutic drug monitoring 07/13/2014   Failed back syndrome of lumbar spine 07/13/2014   Encounter for therapeutic drug level monitoring 07/13/2014   ACTH excess, 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[place]) 06/16/2014   Diabetes ([place]) 06/16/2014   Fibrositis 06/16/2014   Cannot sleep 06/16/2014   Extreme obesity 06/16/2014   Neuropathy 06/16/2014   Allergic rhinitis, seasonal 06/16/2014   Post menopausal syndrome 06/16/2014   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   Spinal stenosis  06/16/2014   Adult hypothyroidism 06/16/2014   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)    pituitary tumor removed 2012   DDD (degenerative disc disease), lumbar    Diabetes mellitus    Endocrinologist Dr. Iran Planas   Difficult intubation    2011 scratched trachea   Dyspnea    upon exertion   Fibromyalgia    Gastroparesis    GERD (gastroesophageal reflux disease)    Headache(784.0)    Hypertension    Hypothyroidism    Lumbar disc disease    MGUS (monoclonal gammopathy of unknown significance)    Mixed connective tissue disease ([place])    Neuropathy    Pneumonia    Sjogren's disease ([place])    Sleep apnea    no cpap.sleep study 2006 [place]    Family History  Problem Relation Age of Onset   Diabetes type II Mother    Diabetes type II Father    Heart disease Father    Heart attack Father    Colon cancer Maternal Grandmother    Heart disease Maternal Grandfather    Past Surgical History:  Procedure Laterality Date   APPENDECTOMY     BACK SURGERY     lumbar fusion   BIOPSY  01/15/2020   Procedure: BIOPSY;  Surgeon: Carol Ada, MD;  Location: [facility];  Service: Endoscopy;;   BRAIN SURGERY     For cushings   CARPAL TUNNEL RELEASE Bilateral    CATARACT EXTRACTION, BILATERAL     CESAREAN SECTION  10/31/1973   COLONOSCOPY     COLONOSCOPY WITH PROPOFOL N/A 01/15/2020   Procedure: COLONOSCOPY WITH PROPOFOL;  Surgeon: Carol Ada, MD;  Location: [facility];  Service: Endoscopy;  Laterality: N/A;   COMBINED HYSTEROSCOPY DIAGNOSTIC / D&C     ENDOMETRIAL ABLATION     ESOPHAGOGASTRODUODENOSCOPY (EGD) WITH PROPOFOL N/A  01/15/2020   Procedure: ESOPHAGOGASTRODUODENOSCOPY (EGD) WITH PROPOFOL;  Surgeon: Carol Ada, MD;  Location: [facility];  Service: Endoscopy;  Laterality: N/A;   ESOPHAGOGASTRODUODENOSCOPY ENDOSCOPY     FRACTURE SURGERY     l arm   LUMBAR LAMINECTOMY/DECOMPRESSION MICRODISCECTOMY  11/13/2011   Procedure: LUMBAR LAMINECTOMY/DECOMPRESSION MICRODISCECTOMY;  Surgeon: Faythe Ghee, MD;  Location: [facility];  Service: Neurosurgery;  Laterality: Bilateral;  Bilateral Lumbar four-five Decompression   POLYPECTOMY  01/15/2020   Procedure: POLYPECTOMY;  Surgeon: Carol Ada, MD;  Location: [facility];  Service: Endoscopy;;   TONSILLECTOMY     TRANSPHENOIDAL / TRANSNASAL HYPOPHYSECTOMY / RESECTION PITUITARY TUMOR     TRIGGER FINGER RELEASE     TUBAL LIGATION     Social History   Social History Narrative   Not on file   Immunization History  Administered Date(s) Administered   Influenza Inj Mdck Quad Pf 07/12/2017, 07/14/2018   Influenza Split 06/13/2017   Pneumococcal Conjugate-13 12/28/2014   Pneumococcal Polysaccharide-23 01/03/2017     Objective: Vital Signs: BP 136/79 (BP Location: Left Arm, Patient Position: Sitting,  Cuff Size: Normal)   Pulse 71   Ht 5\' 3"  (1.6 m)   Wt 264 lb 6.4 oz (119.9 kg)   LMP 11/08/2006   BMI 46.84 kg/m    Physical Exam Vitals and nursing note reviewed.  Constitutional:      Appearance: She is well-developed.  HENT:     Head: Normocephalic and atraumatic.  Eyes:     Conjunctiva/sclera: Conjunctivae normal.  Cardiovascular:     Rate and Rhythm: Normal rate and regular rhythm.     Heart sounds: Normal heart sounds.  Pulmonary:     Effort: Pulmonary effort is normal.     Breath sounds: Normal breath sounds.  Abdominal:     General: Bowel sounds are normal.     Palpations: Abdomen is soft.  Musculoskeletal:     Cervical back: Normal range of motion.  Lymphadenopathy:     Cervical: No cervical adenopathy.  Skin:    General: Skin is warm  and dry.     Capillary Refill: Capillary refill takes less than 2 seconds.  Neurological:     Mental Status: She is alert and oriented to person, place, and time.  Psychiatric:        Behavior: Behavior normal.     Musculoskeletal Exam: C-spine was in good range of motion.  She had no tenderness over thoracic or lumbar spine.  Shoulder joints, elbow joints, wrist joints, MCPs PIPs and DIPs with good range of motion with no synovitis.  Hip joints with good range of motion.  She had no warmth swelling or effusion in her knee joints.  She had no tenderness over ankles MTPs or PIPs.  CDAI Exam: CDAI Score: -- Patient Global: --; Provider Global: -- Swollen: --; Tender: -- Joint Exam 06/06/2021   No joint exam has been documented for this visit   There is currently no information documented on the homunculus. Go to the Rheumatology activity and complete the homunculus joint exam.  Investigation: No additional findings.  Imaging: No results found.  Recent Labs: Lab Results  Component Value Date   WBC 6.5 01/05/2021   HGB 13.1 01/05/2021   PLT 193 01/05/2021   NA 140 01/05/2021   K 4.1 01/05/2021   CL 102 01/05/2021   CO2 28 01/05/2021   GLUCOSE 136 (H) 01/05/2021   BUN 18 01/05/2021   CREATININE 0.61 01/05/2021   BILITOT 0.5 01/05/2021   ALKPHOS 65 05/20/2019   AST 26 01/05/2021   ALT 24 01/05/2021   PROT 7.3 01/05/2021   ALBUMIN 3.7 05/20/2019   CALCIUM 9.1 01/05/2021   GFRAA 111 01/05/2021    Speciality Comments: PLQ eye exam: 11/09/2020 [facility] Follow up in 1 year  Procedures:  No procedures performed Allergies: Codeine, Methotrexate, Atorvastatin, and Quinapril hcl   Assessment / Plan:     Visit Diagnoses: Mixed connective tissue disease ([place]) - ANA 1:160 NS, RNP positive, inflammatory arthritis: She had no synovitis on examination.  She has been tolerating hydroxychloroquine well.  She has not had any recent episodes of Raynaud's.  High risk medication use  - Plaquenil 200 mg 1 tablet by mouth twice daily.  PLQ eye exam: 11/09/2020.  Labs obtained from Jan 05, 2021 were within normal limits which were reviewed.  Her autoimmune labs done in May were also within normal limits which were reviewed today.  I will obtain labs today and then in 5 months to monitor for disease process and medication management.  Sjogren's syndrome with keratoconjunctivitis sicca ([place]) -  [place]-, [place]-: Over-the-counter medications for sicca symptoms were discussed.  Primary osteoarthritis of both hands-she had bilateral PIP and DIP thickening.  Joint protection muscle strengthening was discussed.  Primary osteoarthritis of both knees-she has been experiencing increased pain in her right knee joint for the last 2 months.  She states the pain has eased off.  No warmth swelling or effusion was noted.  I do not have previous x-rays of her right knee joint in the system.  Patient states she has seen an orthopedic surgeon in the past.  Weight loss diet and exercise was discussed.  DDD (degenerative disc disease), lumbar-she continues to have some lower back pain.  Fibromyalgia-she has generalized pain and positive tender points.  She is generalized hyperalgesia.  Fatigue-patient was recently evaluated by [facility] pulmonary.  She states chest x-ray was normal.  She also had PFTs which were normal per patient.  She had high-resolution CT in the past which was negative for ILD.  Obstructive sleep apnea-she is followed by Dr. Felecia Shelling.  She states she was placed on 2 L of oxygen at bedtime per nasal cannula.  Other medical problems are listed as follows:  MGUS (monoclonal gammopathy of unknown significance)  History of chronic kidney disease  History of depression  History of hypothyroidism  History of chronic pain  History of anxiety  History of Cushing disease  History of diabetes mellitus  History of cholecystectomy  Orders: Orders Placed This Encounter   Procedures   CBC with Differential/Platelet   COMPLETE METABOLIC PANEL WITH GFR   Urinalysis, Routine w reflex microscopic   Anti-DNA antibody, double-stranded   C3 and C4   Sedimentation rate   Hydroxychloroquine, Blood   CBC with Differential/Platelet   COMPLETE METABOLIC PANEL WITH GFR   Urinalysis, Routine w reflex microscopic   Anti-DNA antibody, double-stranded   C3 and C4   Sedimentation rate    No orders of the defined types were placed in this encounter.    Follow-Up Instructions: Return in about 5 months (around 11/04/2021) for MCTD.   Bo Merino, MD  Note - This record has been created using Editor, commissioning.  Chart creation errors have been sought, but may not always  have been located. Such creation errors do not reflect on  the standard of medical care.

## 2021-06-06 ENCOUNTER — Encounter: Payer: Self-pay | Admitting: Rheumatology

## 2021-06-06 ENCOUNTER — Ambulatory Visit (INDEPENDENT_AMBULATORY_CARE_PROVIDER_SITE_OTHER): Admitting: Rheumatology

## 2021-06-06 ENCOUNTER — Other Ambulatory Visit: Payer: Self-pay

## 2021-06-06 VITALS — BP 136/79 | HR 71 | Ht 63.0 in | Wt 264.4 lb

## 2021-06-06 DIAGNOSIS — R5383 Other fatigue: Secondary | ICD-10-CM

## 2021-06-06 DIAGNOSIS — M5136 Other intervertebral disc degeneration, lumbar region: Secondary | ICD-10-CM

## 2021-06-06 DIAGNOSIS — Z79899 Other long term (current) drug therapy: Secondary | ICD-10-CM | POA: Diagnosis not present

## 2021-06-06 DIAGNOSIS — D472 Monoclonal gammopathy: Secondary | ICD-10-CM

## 2021-06-06 DIAGNOSIS — G8929 Other chronic pain: Secondary | ICD-10-CM

## 2021-06-06 DIAGNOSIS — M351 Other overlap syndromes: Secondary | ICD-10-CM

## 2021-06-06 DIAGNOSIS — M19042 Primary osteoarthritis, left hand: Secondary | ICD-10-CM

## 2021-06-06 DIAGNOSIS — M797 Fibromyalgia: Secondary | ICD-10-CM

## 2021-06-06 DIAGNOSIS — M3501 Sicca syndrome with keratoconjunctivitis: Secondary | ICD-10-CM

## 2021-06-06 DIAGNOSIS — M25561 Pain in right knee: Secondary | ICD-10-CM

## 2021-06-06 DIAGNOSIS — M19041 Primary osteoarthritis, right hand: Secondary | ICD-10-CM | POA: Diagnosis not present

## 2021-06-06 DIAGNOSIS — Z8659 Personal history of other mental and behavioral disorders: Secondary | ICD-10-CM

## 2021-06-06 DIAGNOSIS — Z87448 Personal history of other diseases of urinary system: Secondary | ICD-10-CM

## 2021-06-06 DIAGNOSIS — Z9049 Acquired absence of other specified parts of digestive tract: Secondary | ICD-10-CM

## 2021-06-06 DIAGNOSIS — M170 Bilateral primary osteoarthritis of knee: Secondary | ICD-10-CM

## 2021-06-06 DIAGNOSIS — G4733 Obstructive sleep apnea (adult) (pediatric): Secondary | ICD-10-CM

## 2021-06-06 DIAGNOSIS — Z8639 Personal history of other endocrine, nutritional and metabolic disease: Secondary | ICD-10-CM

## 2021-06-06 DIAGNOSIS — Z87898 Personal history of other specified conditions: Secondary | ICD-10-CM

## 2021-06-06 NOTE — Patient Instructions (Signed)
Standing Labs We placed an order today for your standing lab work.   Please have your standing labs drawn in March  If possible, please have your labs drawn 2 weeks prior to your appointment so that the provider can discuss your results at your appointment.  Please note that you may see your imaging and lab results in [facility] before we have reviewed them. We may be awaiting multiple results to interpret others before contacting you. Please allow our office up to 72 hours to thoroughly review all of the results before contacting the office for clarification of your results.  We have open lab daily: Monday through Thursday from 1:30-4:30 PM and Friday from 1:30-4:00 PM at the office of Dr. Bo Merino, [facility].   Please be advised, all patients with office appointments requiring lab work will take precedent over walk-in lab work.  If possible, please come for your lab work on Monday and Friday afternoons, as you may experience shorter wait times. The office is located at [address], [place], [place], [place] [address] No appointment is necessary.   Labs are drawn by [facility]. Please bring your co-pay at the time of your lab draw.  You may receive a bill from [facility] for your lab work.  If you wish to have your labs drawn at another location, please call the office 24 hours in advance to send orders.  If you have any questions regarding directions or hours of operation,  please call [phone].   As a reminder, please drink plenty of water prior to coming for your lab work. Thanks!   Vaccines You are taking a medication(s) that can suppress your immune system.  The following immunizations are recommended: Flu annually Covid-19  Td/Tdap (tetanus, diphtheria, pertussis) every 10 years Pneumonia (Prevnar 15 then Pneumovax 23 at least 1 year apart.  Alternatively, can take Prevnar 20 without needing additional dose) Shingrix: 2 doses from 4 weeks to 6 months  apart  Please check with your PCP to make sure you are up to date.  Heart Disease Prevention   Your inflammatory disease increases your risk of heart disease which includes heart attack, stroke, atrial fibrillation (irregular heartbeats), high blood pressure, heart failure and atherosclerosis (plaque in the arteries).  It is important to reduce your risk by:   Keep blood pressure, cholesterol, and blood sugar at healthy levels   Smoking Cessation   Maintain a healthy weight  BMI 20-25   Eat a healthy diet  Plenty of fresh fruit, vegetables, and whole grains  Limit saturated fats, foods high in sodium, and added sugars  DASH and Mediterranean diet   Increase physical activity  Recommend moderate physically activity for 150 minutes per week/ 30 minutes a day for five days a week These can be broken up into three separate ten-minute sessions during the day.   Reduce Stress  Meditation, slow breathing exercises, yoga, coloring books  Dental visits twice a year

## 2021-06-13 ENCOUNTER — Other Ambulatory Visit: Payer: Self-pay | Admitting: Physician Assistant

## 2021-06-13 DIAGNOSIS — M351 Other overlap syndromes: Secondary | ICD-10-CM

## 2021-06-13 NOTE — Telephone Encounter (Signed)
Next Visit: 10/31/2021  Last Visit: 06/06/2021  Labs: 06/06/2021 Glucose 131, MCHC 31.7  Eye exam: 11/09/2020    Current Dose per office note 06/06/2021: Plaquenil 200 mg 1 tablet by mouth twice daily  QD:UKRCV connective tissue disease   Last Fill: 02/27/2021  Okay to refill Plaquenil?

## 2021-06-15 LAB — COMPLETE METABOLIC PANEL WITH GFR
AG Ratio: 1.3 (calc) (ref 1.0–2.5)
ALT: 20 U/L (ref 6–29)
AST: 18 U/L (ref 10–35)
Albumin: 4 g/dL (ref 3.6–5.1)
Alkaline phosphatase (APISO): 71 U/L (ref 37–153)
BUN: 14 mg/dL (ref 7–25)
CO2: 28 mmol/L (ref 20–32)
Calcium: 9.1 mg/dL (ref 8.6–10.4)
Chloride: 102 mmol/L (ref 98–110)
Creat: 0.57 mg/dL (ref 0.50–1.05)
Globulin: 3.2 g/dL (calc) (ref 1.9–3.7)
Glucose, Bld: 131 mg/dL — ABNORMAL HIGH (ref 65–99)
Potassium: 4.3 mmol/L (ref 3.5–5.3)
Sodium: 139 mmol/L (ref 135–146)
Total Bilirubin: 0.4 mg/dL (ref 0.2–1.2)
Total Protein: 7.2 g/dL (ref 6.1–8.1)
eGFR: 101 mL/min/{1.73_m2} (ref >=60)

## 2021-06-15 LAB — URINALYSIS, ROUTINE W REFLEX MICROSCOPIC
Bilirubin Urine: NEGATIVE
Hgb urine dipstick: NEGATIVE
Ketones, ur: NEGATIVE
Leukocytes,Ua: NEGATIVE
Nitrite: NEGATIVE
Protein, ur: NEGATIVE
Specific Gravity, Urine: 1.008 (ref 1.001–1.035)
pH: 5.5 (ref 5.0–8.0)

## 2021-06-15 LAB — CBC WITH DIFFERENTIAL/PLATELET
Absolute Monocytes: 409 cells/uL (ref 200–950)
Basophils Absolute: 37 cells/uL (ref 0–200)
Basophils Relative: 0.6 %
Eosinophils Absolute: 174 cells/uL (ref 15–500)
Eosinophils Relative: 2.8 %
HCT: 42 % (ref 35.0–45.0)
Hemoglobin: 13.3 g/dL (ref 11.7–15.5)
Lymphs Abs: 1203 cells/uL (ref 850–3900)
MCH: 27.8 pg (ref 27.0–33.0)
MCHC: 31.7 g/dL — ABNORMAL LOW (ref 32.0–36.0)
MCV: 87.9 fL (ref 80.0–100.0)
MPV: 10.5 fL (ref 7.5–12.5)
Monocytes Relative: 6.6 %
Neutro Abs: 4377 cells/uL (ref 1500–7800)
Neutrophils Relative %: 70.6 %
Platelets: 182 10*3/uL (ref 140–400)
RBC: 4.78 10*6/uL (ref 3.80–5.10)
RDW: 14.9 % (ref 11.0–15.0)
Total Lymphocyte: 19.4 %
WBC: 6.2 10*3/uL (ref 3.8–10.8)

## 2021-06-15 LAB — C3 AND C4
C3 Complement: 168 mg/dL (ref 83–193)
C4 Complement: 22 mg/dL (ref 15–57)

## 2021-06-15 LAB — ANTI-DNA ANTIBODY, DOUBLE-STRANDED: ds DNA Ab: 1 IU/mL

## 2021-06-15 LAB — SEDIMENTATION RATE: Sed Rate: 38 mm/h — ABNORMAL HIGH (ref 0–30)

## 2021-06-15 LAB — HYDROXYCHLOROQUINE,BLOOD: HYDROXYCHLOROQUINE, (B): 390 ng/mL

## 2021-06-15 NOTE — Progress Notes (Signed)
CBC is normal, glucose is elevated, UA shows glucose, double-stranded DNA is negative complements are normal sed rate is mildly elevated and stable, hydroxychloroquine level is low at 390 and not therapeutic which should be about 1000.  Please advise patient to take hydroxychloroquine 200 mg p.o. twice daily as prescribed.

## 2021-07-24 ENCOUNTER — Other Ambulatory Visit: Payer: Self-pay | Admitting: Neurology

## 2021-07-25 ENCOUNTER — Telehealth: Payer: Self-pay | Admitting: Neurology

## 2021-07-25 ENCOUNTER — Other Ambulatory Visit: Payer: Self-pay | Admitting: Neurology

## 2021-07-25 NOTE — Telephone Encounter (Signed)
Pt called, do not have any refills left for lidocaine (XYLOCAINE) 5 % ointment. Use 2 times a night on my feet. Would like a call from the nurse to discuss why refill was refuses.

## 2021-07-25 NOTE — Telephone Encounter (Signed)
Refill for the medication was not refused. We had just not approved and sent to the pharmacy yet. I have done so now and the refill has been forwarded to the requested pharmacy.  **If pt calls back please advise the refill has been sent and was not refused.

## 2021-10-17 NOTE — Progress Notes (Unsigned)
Office Visit Note  Patient: Gloria Porter             Date of Birth: Feb 07, 1956           MRN: [id number]             PCP: Florestine Avers (Inactive) Referring: No ref. provider found Visit Date: 10/31/2021 Occupation: '@GUAROCC'$ @  Subjective:  No chief complaint on file.   History of Present Illness: Gloria Porter is a 66 y.o. female ***   Activities of Daily Living:  Patient reports morning stiffness for *** {minute/hour:19697}.   Patient {ACTIONS;DENIES/REPORTS:21021675::"Denies"} nocturnal pain.  Difficulty dressing/grooming: {ACTIONS;DENIES/REPORTS:21021675::"Denies"} Difficulty climbing stairs: {ACTIONS;DENIES/REPORTS:21021675::"Denies"} Difficulty getting out of chair: {ACTIONS;DENIES/REPORTS:21021675::"Denies"} Difficulty using hands for taps, buttons, cutlery, and/or writing: {ACTIONS;DENIES/REPORTS:21021675::"Denies"}  No Rheumatology ROS completed.   PMFS History:  Patient Active Problem List   Diagnosis Date Noted   History of lumbar surgery 09/16/2019   Pseudoarthrosis of lumbar spine 03/02/2019   Primary osteoarthritis of both hands 04/10/2017   Primary osteoarthritis of both knees 04/10/2017   DDD (degenerative disc disease), lumbar 04/10/2017   Mixed connective tissue disease ([place]) 04/10/2017   S/P trigger finger release bilateral third 03/14/2017   MGUS (monoclonal gammopathy of unknown significance) 03/11/2017   History of chronic kidney disease 03/11/2017   Monoclonal paraproteinemia 01/11/2016   Restless leg 01/11/2015   Carpal tunnel syndrome 12/28/2014   Dysphagia, oropharyngeal 12/28/2014   Anxiety, generalized 10/14/2014   Generalized joint pain 08/17/2014   Encounter for therapeutic drug monitoring 07/13/2014   Failed back syndrome of lumbar spine 07/13/2014   Encounter for therapeutic drug level monitoring 07/13/2014   ACTH excess, central ([place]) 06/16/2014   Diabetes ([place]) 06/16/2014   Fibrositis 06/16/2014   Cannot sleep 06/16/2014    Extreme obesity 06/16/2014   Neuropathy 06/16/2014   Allergic rhinitis, seasonal 06/16/2014   Post menopausal syndrome 06/16/2014   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   Spinal stenosis 06/16/2014   Adult hypothyroidism 06/16/2014   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[place]) 06/16/2014   Diabetes mellitus ([place]) 06/16/2014   Fibromyalgia 06/16/2014   Morbid obesity ([place]) 06/16/2014   Sjogren's syndrome ([place]) 06/16/2014   Postmenopausal HRT (hormone replacement therapy) 06/16/2014   Breath shortness 01/21/2014   Essential (primary) hypertension 01/21/2014   Acid reflux 01/21/2014   Adiposity 01/21/2014   Depression with anxiety 08/26/2013   OSA (obstructive sleep apnea) 01/17/2013   Cushing's syndrome ([place]) 05/23/2011   Pituitary adenoma ([place]) 05/23/2011    Past Medical History:  Diagnosis Date   Anxiety    Arthritis    Cushing's disease ([place])    pituitary tumor removed 2012   DDD (degenerative disc disease), lumbar    Diabetes mellitus    Endocrinologist Dr. Iran Planas   Difficult intubation    2011 scratched trachea   Dyspnea    upon exertion   Fibromyalgia    Gastroparesis    GERD (gastroesophageal reflux disease)    Headache(784.0)    Hypertension    Hypothyroidism    Lumbar disc disease    MGUS (monoclonal gammopathy of unknown significance)    Mixed connective tissue disease ([place])    Neuropathy    Pneumonia    Sjogren's disease ([place])    Sleep apnea    no cpap.sleep study 2006 [place]    Family History  Problem Relation Age of Onset   Diabetes type II Mother    Diabetes type II Father    Heart disease Father    Heart attack Father  Colon cancer Maternal Grandmother    Heart disease Maternal Grandfather    Past Surgical History:  Procedure Laterality Date   APPENDECTOMY     BACK SURGERY     lumbar fusion   BIOPSY  01/15/2020   Procedure: BIOPSY;  Surgeon: Carol Ada, MD;  Location: [facility];  Service: Endoscopy;;   BRAIN SURGERY      For cushings   CARPAL TUNNEL RELEASE Bilateral    CATARACT EXTRACTION, BILATERAL     CESAREAN SECTION  10/31/1973   COLONOSCOPY     COLONOSCOPY WITH PROPOFOL N/A 01/15/2020   Procedure: COLONOSCOPY WITH PROPOFOL;  Surgeon: Carol Ada, MD;  Location: [facility];  Service: Endoscopy;  Laterality: N/A;   COMBINED HYSTEROSCOPY DIAGNOSTIC / D&C     ENDOMETRIAL ABLATION     ESOPHAGOGASTRODUODENOSCOPY (EGD) WITH PROPOFOL N/A 01/15/2020   Procedure: ESOPHAGOGASTRODUODENOSCOPY (EGD) WITH PROPOFOL;  Surgeon: Carol Ada, MD;  [facility];  Service: Endoscopy;  Laterality: N/A;   ESOPHAGOGASTRODUODENOSCOPY ENDOSCOPY     FRACTURE SURGERY     l arm   LUMBAR LAMINECTOMY/DECOMPRESSION MICRODISCECTOMY  11/13/2011   Procedure: LUMBAR LAMINECTOMY/DECOMPRESSION MICRODISCECTOMY;  Surgeon: Faythe Ghee, MD;  Location: [facility];  Service: Neurosurgery;  Laterality: Bilateral;  Bilateral Lumbar four-five Decompression   POLYPECTOMY  01/15/2020   Procedure: POLYPECTOMY;  Surgeon: Carol Ada, MD;  [facility];  Service: Endoscopy;;   TONSILLECTOMY     TRANSPHENOIDAL / TRANSNASAL HYPOPHYSECTOMY / RESECTION PITUITARY TUMOR     TRIGGER FINGER RELEASE     TUBAL LIGATION     Social History   Social History Narrative   Not on file   Immunization History  Administered Date(s) Administered   Influenza Inj Mdck Quad Pf 07/12/2017, 07/14/2018   Influenza Split 06/13/2017   Pneumococcal Conjugate-13 12/28/2014   Pneumococcal Polysaccharide-23 01/03/2017     Objective: Vital Signs: LMP 11/08/2006    Physical Exam   Musculoskeletal Exam: ***  CDAI Exam: CDAI Score: -- Patient Global: --; Provider Global: -- Swollen: --; Tender: -- Joint Exam 10/31/2021   No joint exam has been documented for this visit   There is currently no information documented on the homunculus. Go to the Rheumatology activity and complete the homunculus joint exam.  Investigation: No additional  findings.  Imaging: No results found.  Recent Labs: Lab Results  Component Value Date   WBC 6.2 06/06/2021   HGB 13.3 06/06/2021   PLT 182 06/06/2021   NA 139 06/06/2021   K 4.3 06/06/2021   CL 102 06/06/2021   CO2 28 06/06/2021   GLUCOSE 131 (H) 06/06/2021   BUN 14 06/06/2021   CREATININE 0.57 06/06/2021   BILITOT 0.4 06/06/2021   ALKPHOS 65 05/20/2019   AST 18 06/06/2021   ALT 20 06/06/2021   PROT 7.2 06/06/2021   ALBUMIN 3.7 05/20/2019   CALCIUM 9.1 06/06/2021   GFRAA 111 01/05/2021    Speciality Comments: PLQ eye exam: 11/09/2020 [facility] Follow up in 1 year  Procedures:  No procedures performed Allergies: Codeine, Methotrexate, Atorvastatin, and Quinapril hcl   Assessment / Plan:     Visit Diagnoses: No diagnosis found.  Orders: No orders of the defined types were placed in this encounter.  No orders of the defined types were placed in this encounter.   Face-to-face time spent with patient was *** minutes. Greater than 50% of time was spent in counseling and coordination of care.  Follow-Up Instructions: No follow-ups on file.   Earnestine Mealing,  CMA  Note - This record has been created using [employer].  Chart creation errors have been sought, but may not always  have been located. Such creation errors do not reflect on  the standard of medical care.

## 2021-10-31 ENCOUNTER — Ambulatory Visit (INDEPENDENT_AMBULATORY_CARE_PROVIDER_SITE_OTHER): Payer: Medicare Other | Admitting: Physician Assistant

## 2021-10-31 ENCOUNTER — Encounter: Payer: Self-pay | Admitting: Physician Assistant

## 2021-10-31 ENCOUNTER — Other Ambulatory Visit: Payer: Self-pay

## 2021-10-31 VITALS — BP 118/70 | HR 78 | Ht 63.0 in | Wt 257.2 lb

## 2021-10-31 DIAGNOSIS — R5383 Other fatigue: Secondary | ICD-10-CM

## 2021-10-31 DIAGNOSIS — Z79899 Other long term (current) drug therapy: Secondary | ICD-10-CM

## 2021-10-31 DIAGNOSIS — D472 Monoclonal gammopathy: Secondary | ICD-10-CM

## 2021-10-31 DIAGNOSIS — Z8659 Personal history of other mental and behavioral disorders: Secondary | ICD-10-CM

## 2021-10-31 DIAGNOSIS — M797 Fibromyalgia: Secondary | ICD-10-CM

## 2021-10-31 DIAGNOSIS — M19042 Primary osteoarthritis, left hand: Secondary | ICD-10-CM

## 2021-10-31 DIAGNOSIS — M19041 Primary osteoarthritis, right hand: Secondary | ICD-10-CM

## 2021-10-31 DIAGNOSIS — Z8639 Personal history of other endocrine, nutritional and metabolic disease: Secondary | ICD-10-CM

## 2021-10-31 DIAGNOSIS — M170 Bilateral primary osteoarthritis of knee: Secondary | ICD-10-CM

## 2021-10-31 DIAGNOSIS — M351 Other overlap syndromes: Secondary | ICD-10-CM | POA: Diagnosis not present

## 2021-10-31 DIAGNOSIS — M3501 Sicca syndrome with keratoconjunctivitis: Secondary | ICD-10-CM | POA: Diagnosis not present

## 2021-10-31 DIAGNOSIS — M5136 Other intervertebral disc degeneration, lumbar region: Secondary | ICD-10-CM

## 2021-10-31 DIAGNOSIS — G4733 Obstructive sleep apnea (adult) (pediatric): Secondary | ICD-10-CM

## 2021-10-31 DIAGNOSIS — Z87898 Personal history of other specified conditions: Secondary | ICD-10-CM

## 2021-10-31 DIAGNOSIS — Z9049 Acquired absence of other specified parts of digestive tract: Secondary | ICD-10-CM

## 2021-10-31 DIAGNOSIS — Z87448 Personal history of other diseases of urinary system: Secondary | ICD-10-CM

## 2021-10-31 MED ORDER — HYDROXYCHLOROQUINE SULFATE 200 MG PO TABS: ORAL_TABLET | ORAL | 0 refills | Status: DC

## 2021-11-01 NOTE — Progress Notes (Signed)
Glucose is 110. Rest of CMP WNL. CBC WNL.  RF negative. Complements WNL. ESR remains slightly elevated.   ?UA revealed 2+ glucose, trace leukocytes, and many bacteria.  Negative for nitrites.  If she develop signs or symptoms of a UTI she should follow up with her PCP or urgent care for further evaluation.

## 2021-11-02 NOTE — Progress Notes (Signed)
ANA negative. dsDNA is negative.  RNP negative.

## 2021-11-04 LAB — COMPLETE METABOLIC PANEL WITH GFR
AG Ratio: 1.1 (calc) (ref 1.0–2.5)
ALT: 17 U/L (ref 6–29)
AST: 19 U/L (ref 10–35)
Albumin: 3.9 g/dL (ref 3.6–5.1)
Alkaline phosphatase (APISO): 71 U/L (ref 37–153)
BUN: 19 mg/dL (ref 7–25)
CO2: 25 mmol/L (ref 20–32)
Calcium: 9 mg/dL (ref 8.6–10.4)
Chloride: 103 mmol/L (ref 98–110)
Creat: 0.71 mg/dL (ref 0.50–1.05)
Globulin: 3.5 g/dL (calc) (ref 1.9–3.7)
Glucose, Bld: 110 mg/dL — ABNORMAL HIGH (ref 65–99)
Potassium: 4.2 mmol/L (ref 3.5–5.3)
Sodium: 139 mmol/L (ref 135–146)
Total Bilirubin: 0.4 mg/dL (ref 0.2–1.2)
Total Protein: 7.4 g/dL (ref 6.1–8.1)
eGFR: 94 mL/min/{1.73_m2} (ref >=60)

## 2021-11-04 LAB — ANA: Anti Nuclear Antibody (ANA): NEGATIVE

## 2021-11-04 LAB — C3 AND C4
C3 Complement: 175 mg/dL (ref 83–193)
C4 Complement: 22 mg/dL (ref 15–57)

## 2021-11-04 LAB — CBC WITH DIFFERENTIAL/PLATELET
Absolute Monocytes: 544 cells/uL (ref 200–950)
Basophils Absolute: 40 cells/uL (ref 0–200)
Basophils Relative: 0.5 %
Eosinophils Absolute: 176 cells/uL (ref 15–500)
Eosinophils Relative: 2.2 %
HCT: 43 % (ref 35.0–45.0)
Hemoglobin: 14.1 g/dL (ref 11.7–15.5)
Lymphs Abs: 1208 cells/uL (ref 850–3900)
MCH: 28.8 pg (ref 27.0–33.0)
MCHC: 32.8 g/dL (ref 32.0–36.0)
MCV: 87.8 fL (ref 80.0–100.0)
MPV: 11.2 fL (ref 7.5–12.5)
Monocytes Relative: 6.8 %
Neutro Abs: 6032 cells/uL (ref 1500–7800)
Neutrophils Relative %: 75.4 %
Platelets: 202 10*3/uL (ref 140–400)
RBC: 4.9 10*6/uL (ref 3.80–5.10)
RDW: 14.7 % (ref 11.0–15.0)
Total Lymphocyte: 15.1 %
WBC: 8 10*3/uL (ref 3.8–10.8)

## 2021-11-04 LAB — URINALYSIS, ROUTINE W REFLEX MICROSCOPIC
Bilirubin Urine: NEGATIVE
Hgb urine dipstick: NEGATIVE
Hyaline Cast: NONE SEEN /LPF
Ketones, ur: NEGATIVE
Nitrite: NEGATIVE
Protein, ur: NEGATIVE
RBC / HPF: NONE SEEN /HPF (ref 0–2)
Specific Gravity, Urine: 1.019 (ref 1.001–1.035)
pH: 6 (ref 5.0–8.0)

## 2021-11-04 LAB — RHEUMATOID FACTOR: Rheumatoid fact SerPl-aCnc: <14 IU/mL (ref <14)

## 2021-11-04 LAB — PROTEIN ELECTROPHORESIS, SERUM, WITH REFLEX
Albumin ELP: 3.7 g/dL — ABNORMAL LOW (ref 3.8–4.8)
Alpha 1: 0.3 g/dL (ref 0.2–0.3)
Alpha 2: 1 g/dL — ABNORMAL HIGH (ref 0.5–0.9)
Beta 2: 0.4 g/dL (ref 0.2–0.5)
Beta Globulin: 0.5 g/dL (ref 0.4–0.6)
Gamma Globulin: 1.4 g/dL (ref 0.8–1.7)
Total Protein: 7.3 g/dL (ref 6.1–8.1)

## 2021-11-04 LAB — MICROSCOPIC MESSAGE

## 2021-11-04 LAB — ANTI-DNA ANTIBODY, DOUBLE-STRANDED: ds DNA Ab: <1 IU/mL

## 2021-11-04 LAB — RNP ANTIBODY: Ribonucleic Protein(ENA) Antibody, IgG: <1 AI

## 2021-11-04 LAB — SEDIMENTATION RATE: Sed Rate: 41 mm/h — ABNORMAL HIGH (ref 0–30)

## 2021-11-06 NOTE — Progress Notes (Signed)
SPEP did not reveal any abnormal protein bands.

## 2021-12-26 ENCOUNTER — Ambulatory Visit (INDEPENDENT_AMBULATORY_CARE_PROVIDER_SITE_OTHER): Payer: Medicare Other | Admitting: Neurology

## 2021-12-26 ENCOUNTER — Encounter: Payer: Self-pay | Admitting: Neurology

## 2021-12-26 VITALS — BP 128/60 | HR 82 | Ht 63.0 in | Wt 256.0 lb

## 2021-12-26 DIAGNOSIS — G629 Polyneuropathy, unspecified: Secondary | ICD-10-CM | POA: Diagnosis not present

## 2021-12-26 DIAGNOSIS — F418 Other specified anxiety disorders: Secondary | ICD-10-CM

## 2021-12-26 DIAGNOSIS — G4700 Insomnia, unspecified: Secondary | ICD-10-CM

## 2021-12-26 DIAGNOSIS — G4733 Obstructive sleep apnea (adult) (pediatric): Secondary | ICD-10-CM | POA: Diagnosis not present

## 2021-12-26 DIAGNOSIS — M351 Other overlap syndromes: Secondary | ICD-10-CM | POA: Diagnosis not present

## 2021-12-26 DIAGNOSIS — D472 Monoclonal gammopathy: Secondary | ICD-10-CM | POA: Diagnosis not present

## 2021-12-26 MED ORDER — TRAZODONE HCL 100 MG PO TABS: ORAL_TABLET | ORAL | 11 refills | Status: DC

## 2021-12-26 MED ORDER — HORIZANT 600 MG PO TBCR: 1.0000 | EXTENDED_RELEASE_TABLET | Freq: Two times a day (BID) | ORAL | 3 refills | Status: DC

## 2021-12-26 NOTE — Progress Notes (Signed)
? ?[facility] ? ?PATIENT: Gloria Porter ?DOB: [birth year] ? ?REFERRING DOCTOR OR PCP:  Idamae Schuller ?SOURCE: paitent ? ?_________________________________ ? ? ?HISTORICAL ? ?CHIEF COMPLAINT:  ?Chief Complaint  ?Patient presents with  ? Follow-up  ?  RM 3, alone. Last seen 12/26/20. Neuropathy pain in feet worse at times. Sleeps with ice packs at foot of bed sometimes. When relaxing, she notices this the most. Using horizant/lidocaine cream prn (sometimes uses 4-5 times per day). Usually does not go to bed until 2am.   ? ? ?HISTORY OF PRESENT ILLNESS:  ?Shannette Tabares is a 66 y.o. woman with neuropathy and fibromyalgia.   ? ?Update 12/26/2021: ?Dysesthesias are moderate.   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  She uses a rollator at times due to her poor balance and she also has arthritis in hip and knees.   She hd seen Dr. Baird Cancer in [facility] for MGUS but has not seen x 2 years.   Horizant 600 mg po bid and lidocaine cream have helped some.  She uses special ice pack socks with benefit at night.    ? ?She also has MCTD and sees Dr. Estanislado Pandy.  She is on Plaquenil but the Methotrexate was stopped..   She rarely takes hydrocodone. ? ?She has hip pain on the right and will be seeing orthopedics.  Pain is different than sciatica she had in 2020-2022.   She had lumbar surgery in July 2022 (L4L5 re-fusion).   Then due to more pain, she saw Dr. Arnoldo Morale for refusion L4L5 and new fusion L5S1.   The left sciatic nerve/radicular issues improved after the second operation    She still has pain when she stands a while. ? ?She was diagnosed with OSA in the past.    However, PSG in 2022 did not show OSA (AHI only 1.1 overall but REM AHI was elevated at 18).   She did have nocturnal hypoxemia and was started on 2L O2 - does not note any improvement in sleep ? ?She has insomnia and takes melatonin 40 mg, Tylenol Pm and 4 mg lorazepam.  The most she sleeps at one time is 2 hours before waking  up again.   She has some daytime somnolence ?  ?She has depression that worsened in 07-30-2020 with Mom's death.   She sees Dr. Erling Cruz, psychiatry.   ? ?Polyneuropathy/MGUS history:   She reports a burning dysesthetic pain and RLS.    Etiology is likely related to either IDDM (dx in 1991; IDDM since 2008) or MGUS.    She had an elevated M-spike on SPEP and is seeing Dr. Baird Cancer in [facility].    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    Bone survey was normal.   She has a family history of Multiple Myeloma ? ?Polysomnogram 02/03/2021  ?There was no significant overall OSA (AHI = 1.1/hr) though she had moderate REM associated OSA (REM OSA = 18/hr) ?Nocturnal hypoxemia with a mean SaO2% = 91% with 33 minutes below SaO2 89% ?Moderate periodic limb movements of sleep but negligible impact on sleep (PLMS arousal index only 1.3/hr) ?Delayed REM latency could be a medicine effect.   ?   ?REVIEW OF SYSTEMS: ?Constitutional: No fevers, chills, sweats, or change in appetite.  Notes fatigue and sleepiness ?Eyes: No visual changes, double vision, eye pain ?Ear, nose and throat: No hearing loss, ear pain, nasal congestion, sore throat ?Cardiovascular: No chest pain, palpitations ?Respiratory:  No shortness of breath at rest  or with exertion.   No wheezes.  Has OSA ?GastrointestinaI: No nausea, vomiting, diarrhea, abdominal pain, fecal incontinence ?Genitourinary:  Notes urgency and rare incontiennce ?Musculoskeletal:  as above ?Integumentary: No rash, pruritus, skin lesions ?Neurological: as above ?Psychiatric: Notes depression and some anxiety ?Endocrine: No palpitations, diaphoresis, change in appetite, change in weigh or increased thirst ?Hematologic/Lymphatic:  No anemia, purpura, petechiae. ?Allergic/Immunologic: No itchy/runny eyes, nasal congestion, recent allergic reactions, rashes ? ?ALLERGIES: ?Allergies  ?Allergen Reactions  ? Codeine Nausea And Vomiting  ?  Other reaction(s): Unknown  ? Methotrexate Nausea And Vomiting and Other  (See Comments)  ?  Over sedation  ? Atorvastatin   ?  She cannot remember reaction  ? Quinapril Hcl Cough  ? ? ?HOME MEDICATIONS: ? ?Current Outpatient Medications:  ?  ACCU-CHEK FASTCLIX LANCETS MISC, , Disp: , Rfl:  ?  ARMOUR THYROID 300 MG tablet, Take 300 mg by mouth every morning., Disp: , Rfl:  ?  betamethasone dipropionate (DIPROLENE) 0.05 % ointment, Apply 1 application topically as needed., Disp: , Rfl:  ?  buPROPion (WELLBUTRIN XL) 150 MG 24 hr tablet, Take 300 mg by mouth daily., Disp: , Rfl:  ?  Calcium Carb-Cholecalciferol (CALCIUM 1000 + D PO), Take 1 tablet by mouth daily. , Disp: , Rfl:  ?  Cholecalciferol (VITAMIN D-3) 125 MCG (5000 UT) TABS, Take 5,000 Units by mouth daily. , Disp: , Rfl:  ?  Dermatological Products, Misc. [facility]) lotion, Apply 1 application topically as needed., Disp: , Rfl:  ?  diclofenac Sodium (VOLTAREN) 1 % GEL, Apply topically as needed., Disp: , Rfl:  ?  dimenhyDRINATE (DRAMAMINE PO), Take 1 Dose by mouth as needed., Disp: , Rfl:  ?  diphenhydramine-acetaminophen (TYLENOL PM) 25-500 MG TABS tablet, Take 3 tablets by mouth at bedtime., Disp: , Rfl:  ?  empagliflozin (JARDIANCE) 25 MG TABS tablet, Take 25 mg by mouth daily. , Disp: , Rfl:  ?  FREESTYLE LITE test strip, daily., Disp: , Rfl:  ?  furosemide (LASIX) 40 MG tablet, Take 40 mg by mouth daily., Disp: , Rfl:  ?  HORIZANT 600 MG TBCR, TAKE 1 TABLET TWICE A DAY, Disp: 180 tablet, Rfl: 3 ?  hydroxychloroquine (PLAQUENIL) 200 MG tablet, TAKE 1 TABLET TWICE A DAY (FOR MIXED CONNECTIVE TISSUE DISEASE), Disp: 180 tablet, Rfl: 0 ?  Insulin Regular Human (HUMULIN R U-500, CONCENTRATED, [place]), Inject 1 Dose into the skin 3 (three) times daily as needed (for high blood sugar)., Disp: , Rfl:  ?  Insulin Syringe-Needle U-100 31G X 5/16" 0.5 ML MISC, USE TO INJECT UP TO THREE TIMES A DAY, Disp: , Rfl:  ?  levocetirizine (XYZAL) 5 MG tablet, Take 5 mg by mouth every evening., Disp: , Rfl:  ?  lidocaine (XYLOCAINE) 5 % ointment,  APPLY 1 APPLICATION ROPICALLY AS NEEDED, Disp: 50 g, Rfl: 11 ?  loratadine (CLARITIN) 10 MG tablet, Take 10 mg by mouth daily., Disp: , Rfl:  ?  LORazepam (ATIVAN) 2 MG tablet, Take 4 mg by mouth at bedtime., Disp: , Rfl:  ?  losartan (COZAAR) 50 MG tablet, Take 50 mg by mouth daily after supper. , Disp: , Rfl:  ?  MELATONIN PO, Take 20 mg by mouth daily., Disp: , Rfl:  ?  montelukast (SINGULAIR) 10 MG tablet, Take 10 mg by mouth daily., Disp: , Rfl:  ?  promethazine (PHENERGAN) 25 MG tablet, Take 25 mg by mouth every 6 (six) hours as needed for nausea or vomiting., Disp: , Rfl:  ?  Semaglutide (OZEMPIC, 0.25 OR 0.5 MG/DOSE, [place]), Inject 1 Dose into the skin once a week., Disp: , Rfl:  ?  traZODone (DESYREL) 100 MG tablet, Take 1 to 2 po qHS, Disp: 60 tablet, Rfl: 11 ? ?PAST MEDICAL HISTORY: ?Past Medical History:  ?Diagnosis Date  ? Anxiety   ? Arthritis   ? Cushing's disease ([place])   ? pituitary tumor removed 2012  ? DDD (degenerative disc disease), lumbar   ? Diabetes mellitus   ? Endocrinologist Dr. Iran Planas  ? Difficult intubation   ? 2011 scratched trachea  ? Dyspnea   ? upon exertion  ? Fibromyalgia   ? Gastroparesis   ? GERD (gastroesophageal reflux disease)   ? Headache(784.0)   ? Hypertension   ? Hypothyroidism   ? Lumbar disc disease   ? MGUS (monoclonal gammopathy of unknown significance)   ? Mixed connective tissue disease ([place])   ? Neuropathy   ? Pneumonia   ? Sjogren's disease ([place])   ? Sleep apnea   ? no cpap.sleep study 2006 [place]  ? ? ?PAST SURGICAL HISTORY: ?Past Surgical History:  ?Procedure Laterality Date  ? APPENDECTOMY    ? BACK SURGERY    ? lumbar fusion  ? BIOPSY  01/15/2020  ? Procedure: BIOPSY;  Surgeon: Carol Ada, MD;  Location: [facility];  Service: Endoscopy;;  ? BRAIN SURGERY    ? For cushings  ? CARPAL TUNNEL RELEASE Bilateral   ? CATARACT EXTRACTION, BILATERAL    ? CESAREAN SECTION  10/31/1973  ? COLONOSCOPY    ? COLONOSCOPY WITH PROPOFOL N/A 01/15/2020  ? Procedure:  COLONOSCOPY WITH PROPOFOL;  Surgeon: Carol Ada, MD;  Location[facility];  Service: Endoscopy;  Laterality: N/A;  ? COMBINED HYSTEROSCOPY DIAGNOSTIC / D&C    ? ENDOMETRIAL ABLATION    ? ESOPHAGOGASTRODU

## 2022-03-22 NOTE — Progress Notes (Unsigned)
Office Visit Note  Patient: Gloria Porter             Date of Birth: January 29, 1956           MRN: [id number]             PCP: Florestine Avers (Inactive) Referring: No ref. provider found Visit Date: 04/04/2022 Occupation: _0 @  Subjective:  Pain in both hands   History of Present Illness: Gloria Porter is a 66 y.o. female with history of mixed connective tissue disease, osteoarthritis, and fibromyalgia.  Patient is currently taking Plaquenil 200 mg 1 tablet by mouth twice daily.  She has persistent pain and stiffness in both hands. She states both hands feel tight. She has ongoing pain in both feet due to neuropathy and tightness from pedal edema.  She has persistent sicca symptoms but denies any sores in her mouth or nose.  She intermittent symptoms of raynaud's in both feet. She denies any recent rashes or increased hair loss.   She denies any shortness of breath or pleuritic chest pain.  She has chronic fatigue secondary to insomnia.  She takes melatonin, tylenol PM, and ativan at bedtime.  She is on 2L of oxygen at night.  She remains under the care of Dr. Camillo Flaming and last follow up was on 01/09/22.       Activities of Daily Living:  Patient reports morning stiffness for 20-30 minutes.   Patient Denies nocturnal pain.  Difficulty dressing/grooming: Reports Difficulty climbing stairs: Reports Difficulty getting out of chair: Reports Difficulty using hands for taps, buttons, cutlery, and/or writing: Denies  Review of Systems  Constitutional:  Positive for fatigue.  HENT:  Positive for mouth dryness. Negative for mouth sores.   Eyes:  Positive for dryness.  Respiratory:  Positive for shortness of breath.   Cardiovascular:  Negative for chest pain and palpitations.  Gastrointestinal:  Negative for blood in stool, constipation and diarrhea.  Endocrine: Positive for increased urination.  Genitourinary:  Negative for involuntary urination.  Musculoskeletal:  Positive for joint  pain, gait problem, joint pain, joint swelling, myalgias, muscle weakness, morning stiffness, muscle tenderness and myalgias.  Skin:  Negative for color change, rash, hair loss and sensitivity to sunlight.  Allergic/Immunologic: Negative for susceptible to infections.  Neurological:  Positive for dizziness. Negative for headaches.  Hematological:  Negative for swollen glands.  Psychiatric/Behavioral:  Positive for depressed mood and sleep disturbance. The patient is nervous/anxious.     PMFS History:  Patient Active Problem List   Diagnosis Date Noted   History of lumbar surgery 09/16/2019   Pseudoarthrosis of lumbar spine 03/02/2019   Primary osteoarthritis of both hands 04/10/2017   Primary osteoarthritis of both knees 04/10/2017   DDD (degenerative disc disease), lumbar 04/10/2017   Mixed connective tissue disease ([place]) 04/10/2017   S/P trigger finger release bilateral third 03/14/2017   MGUS (monoclonal gammopathy of unknown significance) 03/11/2017   History of chronic kidney disease 03/11/2017   Monoclonal paraproteinemia 01/11/2016   Restless leg 01/11/2015   Carpal tunnel syndrome 12/28/2014   Dysphagia, oropharyngeal 12/28/2014   Anxiety, generalized 10/14/2014   Generalized joint pain 08/17/2014   Encounter for therapeutic drug monitoring 07/13/2014   Failed back syndrome of lumbar spine 07/13/2014   Encounter for therapeutic drug level monitoring 07/13/2014   ACTH excess, central ([place]) 06/16/2014   Diabetes ([place]) 06/16/2014   Fibrositis 06/16/2014   Cannot sleep 06/16/2014   Extreme obesity 06/16/2014   Neuropathy 06/16/2014   Allergic rhinitis, seasonal  06/16/2014   Post menopausal syndrome 06/16/2014   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   Spinal stenosis 06/16/2014   Adult hypothyroidism 06/16/2014   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[place]) 06/16/2014   Diabetes mellitus ([place]) 06/16/2014   Fibromyalgia 06/16/2014   Morbid obesity ([place]) 06/16/2014   Sjogren's  syndrome ([place]) 06/16/2014   Postmenopausal HRT (hormone replacement therapy) 06/16/2014   Breath shortness 01/21/2014   Essential (primary) hypertension 01/21/2014   Acid reflux 01/21/2014   Adiposity 01/21/2014   Depression with anxiety 08/26/2013   OSA (obstructive sleep apnea) 01/17/2013   Cushing's syndrome ([place]) 05/23/2011   Pituitary adenoma ([place]) 05/23/2011    Past Medical History:  Diagnosis Date   Anxiety    Arthritis    Cushing's disease ([place])    pituitary tumor removed 2012   DDD (degenerative disc disease), lumbar    Diabetes mellitus    Endocrinologist Dr. Iran Planas   Difficult intubation    2011 scratched trachea   Dyspnea    upon exertion   Fibromyalgia    Gastroparesis    GERD (gastroesophageal reflux disease)    Headache(784.0)    Hypertension    Hypothyroidism    Lumbar disc disease    MGUS (monoclonal gammopathy of unknown significance)    Mixed connective tissue disease ([place])    Neuropathy    Pneumonia    Sjogren's disease ([place])    Sleep apnea    no cpap.sleep study 2006 [place]    Family History  Problem Relation Age of Onset   Diabetes type II Mother    Diabetes type II Father    Heart disease Father    Heart attack Father    Colon cancer Maternal Grandmother    Heart disease Maternal Grandfather    Past Surgical History:  Procedure Laterality Date   APPENDECTOMY     BACK SURGERY     lumbar fusion   BIOPSY  01/15/2020   Procedure: BIOPSY;  Surgeon: Carol Ada, MD;  Location: [facility];  Service: Endoscopy;;   BRAIN SURGERY     For cushings   CARPAL TUNNEL RELEASE Bilateral    CATARACT EXTRACTION, BILATERAL     CESAREAN SECTION  10/31/1973   COLONOSCOPY     COLONOSCOPY WITH PROPOFOL N/A 01/15/2020   Procedure: COLONOSCOPY WITH PROPOFOL;  Surgeon: Carol Ada, MD;  Location: [facility];  Service: Endoscopy;  Laterality: N/A;   COMBINED HYSTEROSCOPY DIAGNOSTIC / D&C     ENDOMETRIAL ABLATION      ESOPHAGOGASTRODUODENOSCOPY (EGD) WITH PROPOFOL N/A 01/15/2020   Procedure: ESOPHAGOGASTRODUODENOSCOPY (EGD) WITH PROPOFOL;  Surgeon: Carol Ada, MD;  Location: [facility];  Service: Endoscopy;  Laterality: N/A;   ESOPHAGOGASTRODUODENOSCOPY ENDOSCOPY     FRACTURE SURGERY     l arm   LUMBAR LAMINECTOMY/DECOMPRESSION MICRODISCECTOMY  11/13/2011   Procedure: LUMBAR LAMINECTOMY/DECOMPRESSION MICRODISCECTOMY;  Surgeon: Faythe Ghee, MD;  Location: [facility];  Service: Neurosurgery;  Laterality: Bilateral;  Bilateral Lumbar four-five Decompression   POLYPECTOMY  01/15/2020   Procedure: POLYPECTOMY;  Surgeon: Carol Ada, MD;  Location: [facility];  Service: Endoscopy;;   TONSILLECTOMY     TRANSPHENOIDAL / TRANSNASAL HYPOPHYSECTOMY / RESECTION PITUITARY TUMOR     TRIGGER FINGER RELEASE     TUBAL LIGATION     Social History   Social History Narrative   Not on file   Immunization History  Administered Date(s) Administered   Influenza Inj Mdck Quad Pf 07/12/2017, 07/14/2018   Influenza Split 06/13/2017   Pneumococcal Conjugate-13 12/28/2014   Pneumococcal Polysaccharide-23 01/03/2017  Objective: Vital Signs: BP 120/74 (BP Location: Right Arm, Patient Position: Sitting, Cuff Size: Large)   Pulse 67   Ht 5' 2.5" (1.588 m)   Wt 264 lb (119.7 kg)   LMP 11/08/2006   BMI 47.52 kg/m    Physical Exam Vitals and nursing note reviewed.  Constitutional:      Appearance: She is well-developed.  HENT:     Head: Normocephalic and atraumatic.  Eyes:     Conjunctiva/sclera: Conjunctivae normal.  Cardiovascular:     Rate and Rhythm: Normal rate and regular rhythm.     Heart sounds: Normal heart sounds.  Pulmonary:     Effort: Pulmonary effort is normal.     Breath sounds: Normal breath sounds.  Abdominal:     General: Bowel sounds are normal.     Palpations: Abdomen is soft.  Musculoskeletal:     Cervical back: Normal range of motion.  Skin:    General: Skin is warm and dry.      Capillary Refill: Capillary refill takes less than 2 seconds.  Neurological:     Mental Status: She is alert and oriented to person, place, and time.  Psychiatric:        Behavior: Behavior normal.      Musculoskeletal Exam: trapezius muscle tension and tenderness bilaterally. C-spine has limited range of motion with lateral rotation.  Postural thoracic kyphosis noted.  Painful range of motion of the lumbar spine.  Shoulder joints have limited range of motion with internal rotation.  Elbow joints have good range of motion with no tenderness or inflammation.  Both wrist joints have good range of motion.  Tenderness over bilateral CMC joints.  Tenderness over PIP joints but no synovitis was noted.  Complete fist formation noted bilaterally.  Hip joints have good range of motion.  Knee joints have good range of motion with no warmth or effusion.  Ankle joints have good range of motion with no tenderness or joint swelling.  Pitting edema noted bilaterally.  CDAI Exam: CDAI Score: -- Patient Global: --; Provider Global: -- Swollen: --; Tender: -- Joint Exam 04/04/2022   No joint exam has been documented for this visit   There is currently no information documented on the homunculus. Go to the Rheumatology activity and complete the homunculus joint exam.  Investigation: No additional findings.  Imaging: No results found.  Recent Labs: Lab Results  Component Value Date   WBC 8.0 10/31/2021   HGB 14.1 10/31/2021   PLT 202 10/31/2021   NA 139 10/31/2021   K 4.2 10/31/2021   CL 103 10/31/2021   CO2 25 10/31/2021   GLUCOSE 110 (H) 10/31/2021   BUN 19 10/31/2021   CREATININE 0.71 10/31/2021   BILITOT 0.4 10/31/2021   ALKPHOS 65 05/20/2019   AST 19 10/31/2021   ALT 17 10/31/2021   PROT 7.4 10/31/2021   PROT 7.3 10/31/2021   ALBUMIN 3.7 05/20/2019   CALCIUM 9.0 10/31/2021   GFRAA 111 01/05/2021    Speciality Comments: PLQ eye exam: 11/10/2021 WNL  [place] Follow up in 1  year  Procedures:  No procedures performed Allergies: Codeine, Methotrexate, Atorvastatin, and Quinapril hcl     Assessment / Plan:     Visit Diagnoses: Mixed connective tissue disease ([place]) - ANA 1:160 NS, RNP positive, inflammatory arthritis: She is not exhibiting any signs or symptoms of active disease at this time.  She has been taking Plaquenil 200 mg 1 tablet by mouth twice daily as prescribed.  Lab  work from 10/31/2021 was reviewed today in the office: RF negative, RNP negative, ANA negative, ESR 41, dsDNA negative, and complements within normal limits.  Discussed that her labs are not consistent with active disease or signs of a flare at this time.  We will repeat the following lab work today for further evaluation.  She continues to have chronic sicca symptoms but has not had any oral or nasal ulcerations.  No cervical lymphadenopathy.  No signs of synovitis were noted on examination today but she continues to have persistent pain and stiffness in both hands.  At times she has difficulty making a complete fist and has tightness in both hands.  Most of her symptoms seem to be due to underlying osteoarthritis.  I offered scheduling an ultrasound of both hands to assess for synovitis but she would like to hold off at this time. She is followed by pulmonology.  Chest x-ray 02/28/2021-Stable.  Echocardiogram 03/31/2021.  She is on oxygen 2L at bedtime.   She will remain on Plaquenil as prescribed.  We will notify her of lab results.  She advised to notify us if she develops any new or worsening symptoms.  She will follow-up in the office in 5 months or sooner if needed.  - Plan: COMPLETE METABOLIC PANEL WITH GFR, CBC with Differential/Platelet, Anti-DNA antibody, double-stranded, C3 and C4, Sedimentation rate, RNP Antibody, Urinalysis, Routine w reflex microscopic  High risk medication use - Plaquenil 200 mg 1 tablet by mouth twice daily.  PLQ eye exam: 11/10/2021 [place] Follow up in 1 year.   CBC and CMP drawn on 10/31/2021.  Orders for CBC and CMP were released today. - Plan: COMPLETE METABOLIC PANEL WITH GFR, CBC with Differential/Platelet  Sjogren's syndrome with keratoconjunctivitis sicca ([place]) - [place]-: She continues to have chronic sicca symptoms.  She has been using over-the-counter products for symptomatic relief.  She remains on Plaquenil as prescribed.  No signs of inflammatory arthritis were noted on examination today. Patient continues to follow-up with Dr. Camillo Flaming (pulmonary) on a regular basis. She is on 2L of oxygen at night.  SPEP did not reveal any abnormal proteins on 10/31/2021.  RF was negative on 10/31/2021.  The following lab work will be updated today for further evaluation. - Plan: COMPLETE METABOLIC PANEL WITH GFR, CBC with Differential/Platelet, C3 and C4, Urinalysis, Routine w reflex microscopic  Primary osteoarthritis of both hands: She has PIP and DIP thickening consistent with osteoarthritis of both hands.  She continues to have chronic pain, stiffness, and intermittent swelling in her hands.  Most of her discomfort is in the PIP joints but no active inflammation was noted.  She was able to make a complete fist today.  Discussed that due to the severity and chronicity of the pain she is experiencing despite remaining on Plaquenil as prescribed I would recommend scheduling an ultrasound of both hands to assess for synovitis.  She would like to hold off at this time.  Discussed the importance of joint protection and muscle strengthening.  She was advised to notify us if her hand pain persist or worsens.  Primary osteoarthritis of both knees: She has good range of motion of both knee joints on examination today.  No warmth or effusion was noted.  DDD (degenerative disc disease), lumbar: Chronic pain.  Fibromyalgia: She has generalized hyperalgesia and positive tender points on examination.  She continues to experience intermittent myalgias and muscle tenderness due  to fibromyalgia.  She also has chronic fatigue secondary  to insomnia.  Discussed the importance of regular exercise and good sleep hygiene.  Other fatigue - Patient was evaluated by [facility] pulmonary.  She continues to have difficulty sleeping at night.  She remains on 2 L of oxygen.  She has been taking Ativan, melatonin, and Tylenol PM at bedtime.  According to the patient she has had a sleep study in the past.  Other medical conditions ar listed as follows:   MGUS (monoclonal gammopathy of unknown significance)  History of hypothyroidism  History of chronic kidney disease  History of diabetes mellitus  OSA (obstructive sleep apnea) - She is followed by Dr. Camillo Flaming. Using 2L of oxygen at night.  History of chronic pain  History of Cushing disease  History of cholecystectomy  History of depression  History of anxiety  Orders: Orders Placed This Encounter  Procedures   COMPLETE METABOLIC PANEL WITH GFR   CBC with Differential/Platelet   Anti-DNA antibody, double-stranded   C3 and C4   Sedimentation rate   RNP Antibody   Urinalysis, Routine w reflex microscopic   No orders of the defined types were placed in this encounter.    Follow-Up Instructions: Return in about 5 months (around 09/04/2022) for MCTD, Osteoarthritis, Fibromyalgia.   Ofilia Neas, PA-C  Note - This record has been created using Dragon software.  Chart creation errors have been sought, but may not always  have been located. Such creation errors do not reflect on  the standard of medical care.

## 2022-04-03 ENCOUNTER — Ambulatory Visit: Payer: Medicare Other | Admitting: Rheumatology

## 2022-04-04 ENCOUNTER — Ambulatory Visit: Payer: Medicare Other | Attending: Rheumatology | Admitting: Physician Assistant

## 2022-04-04 ENCOUNTER — Encounter: Payer: Self-pay | Admitting: Physician Assistant

## 2022-04-04 VITALS — BP 120/74 | HR 67 | Ht 62.5 in | Wt 264.0 lb

## 2022-04-04 DIAGNOSIS — M351 Other overlap syndromes: Secondary | ICD-10-CM | POA: Diagnosis present

## 2022-04-04 DIAGNOSIS — Z79899 Other long term (current) drug therapy: Secondary | ICD-10-CM | POA: Diagnosis present

## 2022-04-04 DIAGNOSIS — Z9049 Acquired absence of other specified parts of digestive tract: Secondary | ICD-10-CM

## 2022-04-04 DIAGNOSIS — M19042 Primary osteoarthritis, left hand: Secondary | ICD-10-CM

## 2022-04-04 DIAGNOSIS — M51369 Other intervertebral disc degeneration, lumbar region without mention of lumbar back pain or lower extremity pain: Secondary | ICD-10-CM

## 2022-04-04 DIAGNOSIS — Z8639 Personal history of other endocrine, nutritional and metabolic disease: Secondary | ICD-10-CM | POA: Diagnosis present

## 2022-04-04 DIAGNOSIS — M19041 Primary osteoarthritis, right hand: Secondary | ICD-10-CM

## 2022-04-04 DIAGNOSIS — R5383 Other fatigue: Secondary | ICD-10-CM | POA: Diagnosis present

## 2022-04-04 DIAGNOSIS — M3501 Sicca syndrome with keratoconjunctivitis: Secondary | ICD-10-CM

## 2022-04-04 DIAGNOSIS — Z87898 Personal history of other specified conditions: Secondary | ICD-10-CM

## 2022-04-04 DIAGNOSIS — D472 Monoclonal gammopathy: Secondary | ICD-10-CM

## 2022-04-04 DIAGNOSIS — M5136 Other intervertebral disc degeneration, lumbar region: Secondary | ICD-10-CM | POA: Insufficient documentation

## 2022-04-04 DIAGNOSIS — M797 Fibromyalgia: Secondary | ICD-10-CM | POA: Diagnosis present

## 2022-04-04 DIAGNOSIS — Z87448 Personal history of other diseases of urinary system: Secondary | ICD-10-CM

## 2022-04-04 DIAGNOSIS — M170 Bilateral primary osteoarthritis of knee: Secondary | ICD-10-CM | POA: Diagnosis present

## 2022-04-04 DIAGNOSIS — G4733 Obstructive sleep apnea (adult) (pediatric): Secondary | ICD-10-CM

## 2022-04-04 DIAGNOSIS — Z8659 Personal history of other mental and behavioral disorders: Secondary | ICD-10-CM

## 2022-04-05 LAB — CBC WITH DIFFERENTIAL/PLATELET
Absolute Monocytes: 487 cells/uL (ref 200–950)
Basophils Absolute: 28 cells/uL (ref 0–200)
Basophils Relative: 0.5 %
Eosinophils Absolute: 140 cells/uL (ref 15–500)
Eosinophils Relative: 2.5 %
HCT: 38.5 % (ref 35.0–45.0)
Hemoglobin: 12.4 g/dL (ref 11.7–15.5)
Lymphs Abs: 935 cells/uL (ref 850–3900)
MCH: 29.5 pg (ref 27.0–33.0)
MCHC: 32.2 g/dL (ref 32.0–36.0)
MCV: 91.4 fL (ref 80.0–100.0)
MPV: 10.1 fL (ref 7.5–12.5)
Monocytes Relative: 8.7 %
Neutro Abs: 4010 cells/uL (ref 1500–7800)
Neutrophils Relative %: 71.6 %
Platelets: 165 10*3/uL (ref 140–400)
RBC: 4.21 10*6/uL (ref 3.80–5.10)
RDW: 14.4 % (ref 11.0–15.0)
Total Lymphocyte: 16.7 %
WBC: 5.6 10*3/uL (ref 3.8–10.8)

## 2022-04-05 LAB — URINALYSIS, ROUTINE W REFLEX MICROSCOPIC
Bilirubin Urine: NEGATIVE
Hgb urine dipstick: NEGATIVE
Ketones, ur: NEGATIVE
Leukocytes,Ua: NEGATIVE
Nitrite: NEGATIVE
Protein, ur: NEGATIVE
Specific Gravity, Urine: 1.018 (ref 1.001–1.035)
pH: 5.5 (ref 5.0–8.0)

## 2022-04-05 LAB — ANTI-DNA ANTIBODY, DOUBLE-STRANDED: ds DNA Ab: <1 IU/mL

## 2022-04-05 LAB — COMPLETE METABOLIC PANEL WITH GFR
AG Ratio: 1.2 (calc) (ref 1.0–2.5)
ALT: 15 U/L (ref 6–29)
AST: 18 U/L (ref 10–35)
Albumin: 3.8 g/dL (ref 3.6–5.1)
Alkaline phosphatase (APISO): 63 U/L (ref 37–153)
BUN: 16 mg/dL (ref 7–25)
CO2: 27 mmol/L (ref 20–32)
Calcium: 8.8 mg/dL (ref 8.6–10.4)
Chloride: 105 mmol/L (ref 98–110)
Creat: 0.66 mg/dL (ref 0.50–1.05)
Globulin: 3.2 g/dL (calc) (ref 1.9–3.7)
Glucose, Bld: 83 mg/dL (ref 65–99)
Potassium: 4.1 mmol/L (ref 3.5–5.3)
Sodium: 141 mmol/L (ref 135–146)
Total Bilirubin: 0.4 mg/dL (ref 0.2–1.2)
Total Protein: 7 g/dL (ref 6.1–8.1)
eGFR: 97 mL/min/{1.73_m2} (ref >=60)

## 2022-04-05 LAB — RNP ANTIBODY: Ribonucleic Protein(ENA) Antibody, IgG: <1 AI

## 2022-04-05 LAB — SEDIMENTATION RATE: Sed Rate: 41 mm/h — ABNORMAL HIGH (ref 0–30)

## 2022-04-05 LAB — C3 AND C4
C3 Complement: 162 mg/dL (ref 83–193)
C4 Complement: 21 mg/dL (ref 15–57)

## 2022-04-05 NOTE — Progress Notes (Signed)
ESR remains elevated but is stable.  UA revealed 3+ glucose.  No proteinuria.  CBC and CMP WNL.  Complements WNL.

## 2022-04-06 NOTE — Progress Notes (Signed)
Labs are not consistent with active disease.

## 2022-04-06 NOTE — Progress Notes (Signed)
dsDNA is negative. RNP negative.

## 2022-05-11 ENCOUNTER — Other Ambulatory Visit: Payer: Self-pay | Admitting: Neurology

## 2022-08-22 NOTE — Progress Notes (Signed)
Office Visit Note  Patient: Gloria Porter             Date of Birth: Jun 01, 1956           MRN: [id number]             PCP: Florestine Avers (Inactive) Referring: No ref. provider found Visit Date: 09/05/2022 Occupation: '@GUAROCC'$ @  Subjective:  Left hip pain  History of Present Illness: Gloria Porter is a 67 y.o. female history of mixed connective tissue disease, osteoarthritis and fibromyalgia syndrome.  She states she has been experiencing discomfort over the left trochanteric region recently.  She has difficulty sleeping on the side.  She was seen at [facility] where she was given a course of Celebrex.  She states it helped for the duration she took it but then the pain recurred.  She has some discomfort from fibromyalgia and has difficulty wearing socks.  She continues to have some pain and stiffness in her hands and her knee joints.  She has not noticed any joint swelling.  There is no history of oral ulcers, nasal ulcers, malar rash, Raynaud's phenomenon, lymphadenopathy.  She continues to have dry mouth and dry eye symptoms.  Fibromyalgia symptoms continue to flare.  Continues to have insomnia for which she has been taking melatonin and Tylenol.  He uses 2 L of oxygen at nighttime.  She was evaluated by Dr. Lynford Citizen in 2019 at the time high-resolution CT and evaluation was negative for ILD.  She also had evaluation by cardiology in the past.  She is followed by oncology for MGUS.    Activities of Daily Living:  Patient reports morning stiffness for 0 minute.   Patient Denies nocturnal pain.  Difficulty dressing/grooming: Denies Difficulty climbing stairs: Reports Difficulty getting out of chair: Reports Difficulty using hands for taps, buttons, cutlery, and/or writing: Denies  Review of Systems  Constitutional:  Positive for fatigue.  HENT:  Positive for mouth dryness. Negative for mouth sores.   Eyes:  Positive for dryness.  Respiratory:  Negative for  shortness of breath.   Cardiovascular:  Negative for chest pain and palpitations.  Gastrointestinal:  Positive for constipation and diarrhea. Negative for blood in stool.  Endocrine: Negative for increased urination.  Genitourinary:  Negative for difficulty urinating.  Musculoskeletal:  Positive for joint pain and joint pain. Negative for gait problem, joint swelling, myalgias, morning stiffness and myalgias.  Skin:  Negative for color change, rash and sensitivity to sunlight.  Allergic/Immunologic: Negative for susceptible to infections.  Neurological:  Negative for headaches.  Hematological:  Negative for swollen glands.  Psychiatric/Behavioral:  Positive for depressed mood and sleep disturbance. The patient is nervous/anxious.     PMFS History:  Patient Active Problem List   Diagnosis Date Noted   History of lumbar surgery 09/16/2019   Pseudoarthrosis of lumbar spine 03/02/2019   Primary osteoarthritis of both hands 04/10/2017   Primary osteoarthritis of both knees 04/10/2017   DDD (degenerative disc disease), lumbar 04/10/2017   Mixed connective tissue disease ([place]) 04/10/2017   S/P trigger finger release bilateral third 03/14/2017   MGUS (monoclonal gammopathy of unknown significance) 03/11/2017   History of chronic kidney disease 03/11/2017   Monoclonal paraproteinemia 01/11/2016   Restless leg 01/11/2015   Carpal tunnel syndrome 12/28/2014   Dysphagia, oropharyngeal 12/28/2014   Anxiety, generalized 10/14/2014   Generalized joint pain 08/17/2014   Encounter for therapeutic drug monitoring 07/13/2014   Failed back syndrome of lumbar spine 07/13/2014   Encounter for  therapeutic drug level monitoring 07/13/2014   ACTH excess, central ([place]) 06/16/2014   Diabetes ([place]) 06/16/2014   Fibrositis 06/16/2014   Cannot sleep 06/16/2014   Extreme obesity 06/16/2014   Neuropathy 06/16/2014   Allergic rhinitis, seasonal 06/16/2014   Post menopausal syndrome 06/16/2014    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   Spinal stenosis 06/16/2014   Adult hypothyroidism 06/16/2014   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[place]) 06/16/2014   Diabetes mellitus ([place]) 06/16/2014   Fibromyalgia 06/16/2014   Morbid obesity ([place]) 06/16/2014   Sjogren's syndrome ([place]) 06/16/2014   Postmenopausal HRT (hormone replacement therapy) 06/16/2014   Breath shortness 01/21/2014   Essential (primary) hypertension 01/21/2014   Acid reflux 01/21/2014   Adiposity 01/21/2014   Depression with anxiety 08/26/2013   OSA (obstructive sleep apnea) 01/17/2013   Cushing's syndrome ([place]) 05/23/2011   Pituitary adenoma ([place]) 05/23/2011    Past Medical History:  Diagnosis Date   Anxiety    Arthritis    Cushing's disease ([place])    pituitary tumor removed 2012   DDD (degenerative disc disease), lumbar    Diabetes mellitus    Endocrinologist Dr. Iran Planas   Difficult intubation    2011 scratched trachea   Dyspnea    upon exertion   Fibromyalgia    Gastroparesis    GERD (gastroesophageal reflux disease)    Headache(784.0)    Hypertension    Hypothyroidism    Lumbar disc disease    MGUS (monoclonal gammopathy of unknown significance)    Mixed connective tissue disease ([place])    Neuropathy    Pneumonia    Sjogren's disease ([place])    Sleep apnea    no cpap.sleep study 2006 [facility]    Family History  Problem Relation Age of Onset   Diabetes type II Mother    Diabetes type II Father    Heart disease Father    Heart attack Father    Colon cancer Maternal Grandmother    Heart disease Maternal Grandfather    Past Surgical History:  Procedure Laterality Date   APPENDECTOMY     BACK SURGERY     lumbar fusion   BIOPSY  01/15/2020   Procedure: BIOPSY;  Surgeon: Carol Ada, MD;  Location: [facility];  Service: Endoscopy;;   BRAIN SURGERY     For cushings   CARPAL TUNNEL RELEASE Bilateral    CATARACT EXTRACTION, BILATERAL     CESAREAN SECTION  10/31/1973   COLONOSCOPY      COLONOSCOPY WITH PROPOFOL N/A 01/15/2020   Procedure: COLONOSCOPY WITH PROPOFOL;  Surgeon: Carol Ada, MD;  Location: [facility];  Service: Endoscopy;  Laterality: N/A;   COMBINED HYSTEROSCOPY DIAGNOSTIC / D&C     ENDOMETRIAL ABLATION     ESOPHAGOGASTRODUODENOSCOPY (EGD) WITH PROPOFOL N/A 01/15/2020   Procedure: ESOPHAGOGASTRODUODENOSCOPY (EGD) WITH PROPOFOL;  Surgeon: Carol Ada, MD;  Location: [facility];  Service: Endoscopy;  Laterality: N/A;   ESOPHAGOGASTRODUODENOSCOPY ENDOSCOPY     FRACTURE SURGERY     l arm   LUMBAR LAMINECTOMY/DECOMPRESSION MICRODISCECTOMY  11/13/2011   Procedure: LUMBAR LAMINECTOMY/DECOMPRESSION MICRODISCECTOMY;  Surgeon: Faythe Ghee, MD;  Location: [facility];  Service: Neurosurgery;  Laterality: Bilateral;  Bilateral Lumbar four-five Decompression   POLYPECTOMY  01/15/2020   Procedure: POLYPECTOMY;  Surgeon: Carol Ada, MD;  Location: [facility];  Service: Endoscopy;;   TONSILLECTOMY     TRANSPHENOIDAL / TRANSNASAL HYPOPHYSECTOMY / RESECTION PITUITARY TUMOR     TRIGGER FINGER RELEASE     TUBAL LIGATION     Social History  Social History Narrative   Not on file   Immunization History  Administered Date(s) Administered   Influenza Inj Mdck Quad Pf 07/12/2017, 07/14/2018   Influenza Split 06/13/2017   Pneumococcal Conjugate-13 12/28/2014   Pneumococcal Polysaccharide-23 01/03/2017     Objective: Vital Signs: Wt 256 lb (116.1 kg)   LMP 11/08/2006   BMI 46.08 kg/m    Physical Exam Vitals and nursing note reviewed.  Constitutional:      Appearance: She is well-developed.  HENT:     Head: Normocephalic and atraumatic.  Eyes:     Conjunctiva/sclera: Conjunctivae normal.  Cardiovascular:     Rate and Rhythm: Normal rate and regular rhythm.     Heart sounds: Normal heart sounds.  Pulmonary:     Effort: Pulmonary effort is normal.     Breath sounds: Normal breath sounds.  Abdominal:     General: Bowel sounds are normal.      Palpations: Abdomen is soft.  Musculoskeletal:     Cervical back: Normal range of motion.  Lymphadenopathy:     Cervical: No cervical adenopathy.  Skin:    General: Skin is warm and dry.     Capillary Refill: Capillary refill takes less than 2 seconds.  Neurological:     Mental Status: She is alert and oriented to person, place, and time.  Psychiatric:        Behavior: Behavior normal.      Musculoskeletal Exam: Cervical spine was in good range of motion.  Shoulders, elbows, wrist joints with good range of motion with no synovitis.  She had bilateral PIP and DIP thickening without synovitis.  Hip joints and knee joints with good range of motion.  She had tenderness on palpation over left trochanteric bursa consistent with trochanteric bursitis.  There was no tenderness over ankles or MTPs.  CDAI Exam: CDAI Score: -- Patient Global: --; Provider Global: -- Swollen: --; Tender: -- Joint Exam 09/05/2022   No joint exam has been documented for this visit   There is currently no information documented on the homunculus. Go to the Rheumatology activity and complete the homunculus joint exam.  Investigation: No additional findings.  Imaging: No results found.  Recent Labs: Lab Results  Component Value Date   WBC 5.6 04/04/2022   HGB 12.4 04/04/2022   PLT 165 04/04/2022   NA 141 04/04/2022   K 4.1 04/04/2022   CL 105 04/04/2022   CO2 27 04/04/2022   GLUCOSE 83 04/04/2022   BUN 16 04/04/2022   CREATININE 0.66 04/04/2022   BILITOT 0.4 04/04/2022   ALKPHOS 65 05/20/2019   AST 18 04/04/2022   ALT 15 04/04/2022   PROT 7.0 04/04/2022   ALBUMIN 3.7 05/20/2019   CALCIUM 8.8 04/04/2022   GFRAA 111 01/05/2021    Speciality Comments: PLQ eye exam: 11/10/2021 WNL  [facility] Follow up in 1 year  Procedures:  No procedures performed Allergies: Codeine, Methotrexate, Atorvastatin, and Quinapril hcl   Assessment / Plan:     Visit Diagnoses: Mixed connective tissue disease  ([place]) - ANA 1:160 NS, RNP positive, inflammatory arthritis:Labs 10/31/2021: RF negative, RNP negative, ANA negative, ESR 41, dsDNA negative, and complements WNL. -She continues to have sicca symptoms.  She denies any history of oral ulcers, nasal ulcers, malar rash, photosensitivity, Raynaud's phenomenon or lymphadenopathy.  She denies any increased shortness of breath.  She denies any joint swelling.  She continues to have some joint pain and discomfort.  Will obtain autoimmune labs related.  Labs in  August 2023 showed normal complements and negative double-stranded DNA.  Sed rate was mildly elevated which has been chronically elevated.  Plan: Protein / creatinine ratio, urine, Anti-DNA antibody, double-stranded, C3 and C4, Sedimentation rate  High risk medication use - Plaquenil 200 mg 1 tablet by mouth twice daily. PLQ eye exam: 11/10/2021 -she was advised to get repeat eye examination in March.  CBC and CMP were normal on April 04, 2022.  Plan: CBC with Differential/Platelet, COMPLETE METABOLIC PANEL WITH GFR  Sicca complex- Ro-, [place]-: She continues to have dry mouth and dry eye symptoms.  Over-the-counter products were discussed.  Primary osteoarthritis of both hands-she had bilateral IP and DIP thickening.  No synovitis was noted.  Joint protection muscle strengthening was discussed.  Trochanteric bursitis left hip-she has been experiencing left trochanteric bursa discomfort and nocturnal pain.  She had Celebrex by [facility] orthopedics which helped for some time.  The pain has recurred.  I would avoid cortisone injection as she is diabetic and also has Cushing syndrome.  I gave her a handout on IT band stretches.  I advised her to contact us if her symptoms persist and we can refer to physical therapy.  Primary osteoarthritis of both knees-she continues to have knee joint discomfort.  No warmth swelling or effusion was noted.  DDD (degenerative disc disease), lumbar-she has chronic Ro back  pain.  Fibromyalgia-she is due to generalized pain and discomfort from fibromyalgia.  She had multiple tender points.  Stretching exercises were discussed.  Other fatigue-related to fibromyalgia.  MGUS (monoclonal gammopathy of unknown significance)-followed by oncology.  Other medical problems are listed as follows:  History of hypothyroidism  History of diabetes mellitus  History of anxiety  History of depression  History of cholecystectomy  History of Cushing disease  History of chronic pain  OSA (obstructive sleep apnea) - She is followed by Dr. Camillo Flaming. Using 2L of oxygen at night.  Orders: Orders Placed This Encounter  Procedures   Protein / creatinine ratio, urine   CBC with Differential/Platelet   COMPLETE METABOLIC PANEL WITH GFR   Anti-DNA antibody, double-stranded   C3 and C4   Sedimentation rate   No orders of the defined types were placed in this encounter.    Follow-Up Instructions: Return in about 5 months (around 02/04/2023) for MCTD.   Bo Merino, MD  Note - This record has been created using Editor, commissioning.  Chart creation errors have been sought, but may not always  have been located. Such creation errors do not reflect on  the standard of medical care.

## 2022-09-05 ENCOUNTER — Encounter: Payer: Self-pay | Admitting: Rheumatology

## 2022-09-05 ENCOUNTER — Ambulatory Visit: Payer: Medicare Other | Attending: Rheumatology | Admitting: Rheumatology

## 2022-09-05 VITALS — BP 119/75 | HR 76 | Ht 62.5 in | Wt 256.0 lb

## 2022-09-05 DIAGNOSIS — M170 Bilateral primary osteoarthritis of knee: Secondary | ICD-10-CM | POA: Diagnosis present

## 2022-09-05 DIAGNOSIS — D472 Monoclonal gammopathy: Secondary | ICD-10-CM | POA: Diagnosis present

## 2022-09-05 DIAGNOSIS — Z87898 Personal history of other specified conditions: Secondary | ICD-10-CM | POA: Insufficient documentation

## 2022-09-05 DIAGNOSIS — M3500 Sicca syndrome, unspecified: Secondary | ICD-10-CM | POA: Insufficient documentation

## 2022-09-05 DIAGNOSIS — M19042 Primary osteoarthritis, left hand: Secondary | ICD-10-CM | POA: Diagnosis present

## 2022-09-05 DIAGNOSIS — M797 Fibromyalgia: Secondary | ICD-10-CM | POA: Diagnosis present

## 2022-09-05 DIAGNOSIS — M351 Other overlap syndromes: Secondary | ICD-10-CM | POA: Diagnosis not present

## 2022-09-05 DIAGNOSIS — R5383 Other fatigue: Secondary | ICD-10-CM | POA: Diagnosis present

## 2022-09-05 DIAGNOSIS — G4733 Obstructive sleep apnea (adult) (pediatric): Secondary | ICD-10-CM | POA: Insufficient documentation

## 2022-09-05 DIAGNOSIS — Z8639 Personal history of other endocrine, nutritional and metabolic disease: Secondary | ICD-10-CM | POA: Insufficient documentation

## 2022-09-05 DIAGNOSIS — M7062 Trochanteric bursitis, left hip: Secondary | ICD-10-CM | POA: Insufficient documentation

## 2022-09-05 DIAGNOSIS — Z9049 Acquired absence of other specified parts of digestive tract: Secondary | ICD-10-CM | POA: Insufficient documentation

## 2022-09-05 DIAGNOSIS — M5136 Other intervertebral disc degeneration, lumbar region: Secondary | ICD-10-CM | POA: Diagnosis present

## 2022-09-05 DIAGNOSIS — M19041 Primary osteoarthritis, right hand: Secondary | ICD-10-CM | POA: Diagnosis not present

## 2022-09-05 DIAGNOSIS — Z8659 Personal history of other mental and behavioral disorders: Secondary | ICD-10-CM | POA: Diagnosis present

## 2022-09-05 DIAGNOSIS — Z79899 Other long term (current) drug therapy: Secondary | ICD-10-CM | POA: Insufficient documentation

## 2022-09-05 DIAGNOSIS — M3501 Sicca syndrome with keratoconjunctivitis: Secondary | ICD-10-CM

## 2022-09-05 DIAGNOSIS — Z87448 Personal history of other diseases of urinary system: Secondary | ICD-10-CM

## 2022-09-05 NOTE — Patient Instructions (Addendum)
Vaccines You are taking a medication(s) that can suppress your immune system.  The following immunizations are recommended: Flu annually Covid-19  RSV Td/Tdap (tetanus, diphtheria, pertussis) every 10 years Pneumonia (Prevnar 15 then Pneumovax 23 at least 1 year apart.  Alternatively, can take Prevnar 20 without needing additional dose) Shingrix: 2 doses from 4 weeks to 6 months apart  Please check with your PCP to make sure you are up to date.    Iliotibial Band Syndrome Rehab Ask your health care provider which exercises are safe for you. Do exercises exactly as told by your health care provider and adjust them as directed. It is normal to feel mild stretching, pulling, tightness, or discomfort as you do these exercises. Stop right away if you feel sudden pain or your pain gets significantly worse. Do not begin these exercises until told by your health care provider. Stretching and range-of-motion exercises These exercises warm up your muscles and joints and improve the movement and flexibility of your hip and pelvis. Quadriceps stretch, prone  Lie on your abdomen (prone position) on a firm surface, such as a bed or padded floor. Bend your left / right knee and reach back to hold your ankle or pant leg. If you cannot reach your ankle or pant leg, loop a belt around your foot and grab the belt instead. Gently pull your heel toward your buttocks. Your knee should not slide out to the side. You should feel a stretch in the front of your thigh and knee (quadriceps). Hold this position for __________ seconds. Repeat __________ times. Complete this exercise __________ times a day. Iliotibial band stretch An iliotibial band is a strong band of muscle tissue that runs from the outer side of your hip to the outer side of your thigh and knee. Lie on your side with your left / right leg in the top position. Bend both of your knees and grab your left / right ankle. Stretch out your bottom arm to help  you balance. Slowly bring your top knee back so your thigh goes behind your trunk. Slowly lower your top leg toward the floor until you feel a gentle stretch on the outside of your left / right hip and thigh. If you do not feel a stretch and your knee will not fall farther, place the heel of your other foot on top of your knee and pull your knee down toward the floor with your foot. Hold this position for __________ seconds. Repeat __________ times. Complete this exercise __________ times a day. Strengthening exercises These exercises build strength and endurance in your hip and pelvis. Endurance is the ability to use your muscles for a long time, even after they get tired. Straight leg raises, side-lying This exercise strengthens the muscles that rotate the leg at the hip and move it away from your body (hip abductors). Lie on your side with your left / right leg in the top position. Lie so your head, shoulder, hip, and knee line up. You may bend your bottom knee to help you balance. Roll your hips slightly forward so your hips are stacked directly over each other and your left / right knee is facing forward. Tense the muscles in your outer thigh and lift your top leg 4-6 inches (10-15 cm). Hold this position for __________ seconds. Slowly lower your leg to return to the starting position. Let your muscles relax completely before doing another repetition. Repeat __________ times. Complete this exercise __________ times a day. Leg raises, prone  This exercise strengthens the muscles that move the hips backward (hip extensors). Lie on your abdomen (prone position) on your bed or a firm surface. You can put a pillow under your hips if that is more comfortable for your lower back. Bend your left / right knee so your foot is straight up in the air. Squeeze your buttocks muscles and lift your left / right thigh off the bed. Do not let your back arch. Tense your thigh muscle as hard as you can without  increasing any knee pain. Hold this position for __________ seconds. Slowly lower your leg to return to the starting position and allow it to relax completely. Repeat __________ times. Complete this exercise __________ times a day. Hip hike Stand sideways on a bottom step. Stand on your left / right leg with your other foot unsupported next to the step. You can hold on to a railing or wall for balance if needed. Keep your knees straight and your torso square. Then lift your left / right hip up toward the ceiling. Slowly let your left / right hip lower toward the floor, past the starting position. Your foot should get closer to the floor. Do not lean or bend your knees. Repeat __________ times. Complete this exercise __________ times a day. This information is not intended to replace advice given to you by your health care provider. Make sure you discuss any questions you have with your health care provider. Document Revised: 10/07/2019 Document Reviewed: 10/07/2019 Elsevier Patient Education  [place].

## 2022-09-06 LAB — CBC WITH DIFFERENTIAL/PLATELET
Absolute Monocytes: 504 cells/uL (ref 200–950)
Basophils Absolute: 32 cells/uL (ref 0–200)
Basophils Relative: 0.5 %
Eosinophils Absolute: 113 cells/uL (ref 15–500)
Eosinophils Relative: 1.8 %
HCT: 39.6 % (ref 35.0–45.0)
Hemoglobin: 13 g/dL (ref 11.7–15.5)
Lymphs Abs: 1178 cells/uL (ref 850–3900)
MCH: 28.6 pg (ref 27.0–33.0)
MCHC: 32.8 g/dL (ref 32.0–36.0)
MCV: 87.2 fL (ref 80.0–100.0)
MPV: 10.3 fL (ref 7.5–12.5)
Monocytes Relative: 8 %
Neutro Abs: 4473 cells/uL (ref 1500–7800)
Neutrophils Relative %: 71 %
Platelets: 180 10*3/uL (ref 140–400)
RBC: 4.54 10*6/uL (ref 3.80–5.10)
RDW: 15.2 % — ABNORMAL HIGH (ref 11.0–15.0)
Total Lymphocyte: 18.7 %
WBC: 6.3 10*3/uL (ref 3.8–10.8)

## 2022-09-06 LAB — COMPLETE METABOLIC PANEL WITH GFR
AG Ratio: 1.1 (calc) (ref 1.0–2.5)
ALT: 17 U/L (ref 6–29)
AST: 19 U/L (ref 10–35)
Albumin: 3.7 g/dL (ref 3.6–5.1)
Alkaline phosphatase (APISO): 69 U/L (ref 37–153)
BUN: 12 mg/dL (ref 7–25)
CO2: 30 mmol/L (ref 20–32)
Calcium: 9.1 mg/dL (ref 8.6–10.4)
Chloride: 103 mmol/L (ref 98–110)
Creat: 0.64 mg/dL (ref 0.50–1.05)
Globulin: 3.5 g/dL (calc) (ref 1.9–3.7)
Glucose, Bld: 139 mg/dL — ABNORMAL HIGH (ref 65–99)
Potassium: 4.3 mmol/L (ref 3.5–5.3)
Sodium: 141 mmol/L (ref 135–146)
Total Bilirubin: 0.3 mg/dL (ref 0.2–1.2)
Total Protein: 7.2 g/dL (ref 6.1–8.1)
eGFR: 97 mL/min/{1.73_m2} (ref >=60)

## 2022-09-06 LAB — SEDIMENTATION RATE: Sed Rate: 36 mm/h — ABNORMAL HIGH (ref 0–30)

## 2022-09-06 LAB — PROTEIN / CREATININE RATIO, URINE
Creatinine, Urine: 83 mg/dL (ref 20–275)
Protein/Creat Ratio: 157 mg/g creat (ref 24–184)
Protein/Creatinine Ratio: 0.157 mg/mg creat (ref 0.024–0.184)
Total Protein, Urine: 13 mg/dL (ref 5–24)

## 2022-09-06 LAB — C3 AND C4
C3 Complement: 152 mg/dL (ref 83–193)
C4 Complement: 21 mg/dL (ref 15–57)

## 2022-09-06 LAB — ANTI-DNA ANTIBODY, DOUBLE-STRANDED: ds DNA Ab: <1 IU/mL

## 2022-09-07 NOTE — Progress Notes (Signed)
CBC normal, CMP showed elevated glucose at 139 probably not a fasting sample.  Sed rate was mildly elevated but better at 36.  Urine protein negative.  Double-stranded DNA negative, complements normal. Treatment advised.

## 2022-11-14 ENCOUNTER — Other Ambulatory Visit: Payer: Self-pay

## 2022-11-27 ENCOUNTER — Telehealth: Payer: Self-pay | Admitting: *Deleted

## 2022-11-27 NOTE — Telephone Encounter (Signed)
LVM for pt to call office.  We [facility] asking for refill to be sent in for trazodone  tab. However, her current med list has it marked that she is not taking. We need to clarify if she is taking this or not. Please ask pt when she calls back.

## 2022-11-27 NOTE — Telephone Encounter (Signed)
Pt called stating that she hit the medication on accident and is not needing a refill on it.

## 2022-11-28 NOTE — Telephone Encounter (Signed)
Noted  

## 2022-12-27 ENCOUNTER — Encounter: Payer: Self-pay | Admitting: Neurology

## 2022-12-27 ENCOUNTER — Ambulatory Visit (INDEPENDENT_AMBULATORY_CARE_PROVIDER_SITE_OTHER): Payer: Medicare Other | Admitting: Neurology

## 2022-12-27 VITALS — BP 148/75 | HR 72 | Ht 62.5 in | Wt 260.0 lb

## 2022-12-27 DIAGNOSIS — G629 Polyneuropathy, unspecified: Secondary | ICD-10-CM | POA: Diagnosis not present

## 2022-12-27 DIAGNOSIS — G4700 Insomnia, unspecified: Secondary | ICD-10-CM

## 2022-12-27 DIAGNOSIS — G4734 Idiopathic sleep related nonobstructive alveolar hypoventilation: Secondary | ICD-10-CM

## 2022-12-27 DIAGNOSIS — M351 Other overlap syndromes: Secondary | ICD-10-CM | POA: Diagnosis not present

## 2022-12-27 DIAGNOSIS — D472 Monoclonal gammopathy: Secondary | ICD-10-CM | POA: Diagnosis not present

## 2022-12-27 DIAGNOSIS — F418 Other specified anxiety disorders: Secondary | ICD-10-CM

## 2022-12-27 MED ORDER — HORIZANT 600 MG PO TBCR: 1.0000 | EXTENDED_RELEASE_TABLET | Freq: Three times a day (TID) | ORAL | 3 refills | Status: DC

## 2022-12-27 MED ORDER — LIDOCAINE 5 % EX OINT: TOPICAL_OINTMENT | CUTANEOUS | 11 refills | Status: DC

## 2022-12-27 NOTE — Progress Notes (Signed)
[facility]  PATIENT: Gloria Porter DOB: 07-11-1956  REFERRING DOCTOR OR PCP:  Doreatha Martin SOURCE: paitent  _________________________________   HISTORICAL  CHIEF COMPLAINT:  Chief Complaint  Patient presents with   Follow-up    RM 3, alone. Last seen 12/26/20. Neuropathy pain in feet worse at times. Sleeps with ice packs at foot of bed sometimes. When relaxing, she notices this the most. Using horizant/lidocaine cream prn (sometimes uses 4-5 times per day). Usually does not go to bed until 2am.     HISTORY OF PRESENT ILLNESS:  Gloria Porter is a 67 y.o. woman with neuropathy and fibromyalgia.    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  However, the dysesthesias can be very severe at times.  The worst pain is in her feet and bother her most later in the day when inactive..  The lidocaine ointment and Horizant 1200 mg po qHShelp some.  .   She has trouble wearing some shoes as the top of the foot is most sensitive.    Gait is mildly off balanced but she is rarely using her rollator   She has seen Dr. Allyne Gee in [facility] for MGUS but has not seen x 3 years.  No recent SPEP/IEF.     Horizant 600 mg po bid and lidocaine cream have helped some.  She uses special ice pack socks with benefit at night.   Lamotrigine was poorly tolerated.   Trazodone and imipramine had not helped.  She also has MCTD and sees Dr. Corliss Skains.  She is on Plaquenil but the Methotrexate was stopped..   She rarely takes hydrocodone.  She has hip pain on the right and will be seeing orthopedics.  Pain is different than sciatica she had in 2020-2022.   She had lumbar surgery in July 2022 (L4L5 re-fusion).   Then due to more pain, she saw Dr. Lovell Sheehan for refusion L4L5 and new fusion L5S1.   The left sciatic nerve/radicular issues improved after the second operation    She still has pain when she stands a while.  She was diagnosed with OSA in the past.     However, PSG in 2022 did not show OSA (AHI only 1.1 overall but REM AHI was elevated at 18).   She did have nocturnal hypoxemia and was started on 2L O2 - does not note any improvement in sleep  She has insomnia and takes melatonin 50 mg, Tylenol Pm and 4 mg lorazepam and Tylenol PM.  The most she sleeps at one time is 2 hours before waking up again.   She has some daytime somnolence   She has depression that worsened in 12-29-2021with Mom's death.   She sees Dr. Sandria Manly, psychiatry.    Polyneuropathy/MGUS history:   She reports a burning dysesthetic pain and RLS.    Etiology is likely related to either IDDM (dx in 1991; IDDM since 2008) or MGUS.    She had an elevated M-spike on SPEP and is seeing Dr. Allyne Gee in [facility].    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    Bone survey was normal.   She has a family history of Multiple Myeloma  Polysomnogram 02/03/2021  There was no significant overall OSA (AHI = 1.1/hr) though she had moderate REM associated OSA (REM OSA = 18/hr) Nocturnal hypoxemia with a mean SaO2% = 91% with 33 minutes below SaO2 89% Moderate periodic limb movements of sleep but negligible impact on sleep (PLMS arousal  index only 1.3/hr) Delayed REM latency could be a medicine effect.      REVIEW OF SYSTEMS: Constitutional: No fevers, chills, sweats, or change in appetite.  Notes fatigue and sleepiness Eyes: No visual changes, double vision, eye pain Ear, nose and throat: No hearing loss, ear pain, nasal congestion, sore throat Cardiovascular: No chest pain, palpitations Respiratory:  No shortness of breath at rest or with exertion.   No wheezes.  Has OSA GastrointestinaI: No nausea, vomiting, diarrhea, abdominal pain, fecal incontinence Genitourinary:  Notes urgency and rare incontiennce Musculoskeletal:  as above Integumentary: No rash, pruritus, skin lesions Neurological: as above Psychiatric: Notes depression and some anxiety Endocrine: No palpitations, diaphoresis, change in  appetite, change in weigh or increased thirst Hematologic/Lymphatic:  No anemia, purpura, petechiae. Allergic/Immunologic: No itchy/runny eyes, nasal congestion, recent allergic reactions, rashes  ALLERGIES: Allergies  Allergen Reactions   Codeine Nausea And Vomiting    Other reaction(s): Unknown   Methotrexate Nausea And Vomiting and Other (See Comments)    Over sedation   Atorvastatin     She cannot remember reaction   Quinapril Hcl Cough    HOME MEDICATIONS:  Current Outpatient Medications:    ACCU-CHEK FASTCLIX LANCETS MISC, , Disp: , Rfl:    ARMOUR THYROID 300 MG tablet, Take 300 mg by mouth every morning., Disp: , Rfl:    betamethasone dipropionate (DIPROLENE) 0.05 % ointment, Apply 1 application topically as needed., Disp: , Rfl:    buPROPion (WELLBUTRIN XL) 150 MG 24 hr tablet, Take 300 mg by mouth daily., Disp: , Rfl:    Calcium Carb-Cholecalciferol (CALCIUM 1000 + D PO), Take 1 tablet by mouth daily. , Disp: , Rfl:    Cholecalciferol (VITAMIN D-3) 125 MCG (5000 UT) TABS, Take 5,000 Units by mouth daily. , Disp: , Rfl:    Dermatological Products, Misc. [facility]) lotion, Apply 1 application topically as needed., Disp: , Rfl:    diclofenac Sodium (VOLTAREN) 1 % GEL, Apply topically as needed., Disp: , Rfl:    dimenhyDRINATE (DRAMAMINE PO), Take 1 Dose by mouth as needed., Disp: , Rfl:    diphenhydramine-acetaminophen (TYLENOL PM) 25-500 MG TABS tablet, Take 3 tablets by mouth at bedtime., Disp: , Rfl:    empagliflozin (JARDIANCE) 25 MG TABS tablet, Take 25 mg by mouth daily. , Disp: , Rfl:    FREESTYLE LITE test strip, daily., Disp: , Rfl:    furosemide (LASIX) 40 MG tablet, Take 40 mg by mouth daily., Disp: , Rfl:    HORIZANT 600 MG TBCR, TAKE 1 TABLET TWICE A DAY, Disp: 180 tablet, Rfl: 3   hydroxychloroquine (PLAQUENIL) 200 MG tablet, TAKE 1 TABLET TWICE A DAY (FOR MIXED CONNECTIVE TISSUE DISEASE), Disp: 180 tablet, Rfl: 0   Insulin Regular Human (HUMULIN R U-500,  CONCENTRATED, [place]), Inject 1 Dose into the skin 3 (three) times daily as needed (for high blood sugar)., Disp: , Rfl:    Insulin Syringe-Needle U-100 31G X 5/16" 0.5 ML MISC, USE TO INJECT UP TO THREE TIMES A DAY, Disp: , Rfl:    levocetirizine (XYZAL) 5 MG tablet, Take 5 mg by mouth every evening., Disp: , Rfl:    lidocaine (XYLOCAINE) 5 % ointment, APPLY 1 APPLICATION ROPICALLY AS NEEDED, Disp: 50 g, Rfl: 11   loratadine (CLARITIN) 10 MG tablet, Take 10 mg by mouth daily., Disp: , Rfl:    LORazepam (ATIVAN) 2 MG tablet, Take 4 mg by mouth at bedtime., Disp: , Rfl:    losartan (COZAAR) 50 MG tablet, Take 50  mg by mouth daily after supper. , Disp: , Rfl:    MELATONIN PO, Take 20 mg by mouth daily., Disp: , Rfl:    montelukast (SINGULAIR) 10 MG tablet, Take 10 mg by mouth daily., Disp: , Rfl:    promethazine (PHENERGAN) 25 MG tablet, Take 25 mg by mouth every 6 (six) hours as needed for nausea or vomiting., Disp: , Rfl:    Semaglutide (OZEMPIC, 0.25 OR 0.5 MG/DOSE, [place]), Inject 1 Dose into the skin once a week., Disp: , Rfl:    traZODone (DESYREL) 100 MG tablet, Take 1 to 2 po qHS, Disp: 60 tablet, Rfl: 11  PAST MEDICAL HISTORY: Past Medical History:  Diagnosis Date   Anxiety    Arthritis    Cushing's disease (HCC)    pituitary tumor removed 2012   DDD (degenerative disc disease), lumbar    Diabetes mellitus    Endocrinologist Dr. Corwin Levins   Difficult intubation    2011 scratched trachea   Dyspnea    upon exertion   Fibromyalgia    Gastroparesis    GERD (gastroesophageal reflux disease)    Headache(784.0)    Hypertension    Hypothyroidism    Lumbar disc disease    MGUS (monoclonal gammopathy of unknown significance)    Mixed connective tissue disease (HCC)    Neuropathy    Pneumonia    Sjogren's disease (HCC)    Sleep apnea    no cpap.sleep study 2006 [facility]    PAST SURGICAL HISTORY: Past Surgical History:  Procedure Laterality Date   APPENDECTOMY     BACK  SURGERY     lumbar fusion   BIOPSY  01/15/2020   Procedure: BIOPSY;  Surgeon: Jeani Hawking, MD;  Location: [facility];  Service: Endoscopy;;   BRAIN SURGERY     For cushings   CARPAL TUNNEL RELEASE Bilateral    CATARACT EXTRACTION, BILATERAL     CESAREAN SECTION  10/31/1973   COLONOSCOPY     COLONOSCOPY WITH PROPOFOL N/A 01/15/2020   Procedure: COLONOSCOPY WITH PROPOFOL;  Surgeon: Jeani Hawking, MD;  Location: [facility];  Service: Endoscopy;  Laterality: N/A;   COMBINED HYSTEROSCOPY DIAGNOSTIC / D&C     ENDOMETRIAL ABLATION     ESOPHAGOGASTRODUODENOSCOPY (EGD) WITH PROPOFOL N/A 01/15/2020   Procedure: ESOPHAGOGASTRODUODENOSCOPY (EGD) WITH PROPOFOL;  Surgeon: Jeani Hawking, MD;  Location: [facility];  Service: Endoscopy;  Laterality: N/A;   ESOPHAGOGASTRODUODENOSCOPY ENDOSCOPY     FRACTURE SURGERY     l arm   LUMBAR LAMINECTOMY/DECOMPRESSION MICRODISCECTOMY  11/13/2011   Procedure: LUMBAR LAMINECTOMY/DECOMPRESSION MICRODISCECTOMY;  Surgeon: Reinaldo Meeker, MD;  Location: [facility];  Service: Neurosurgery;  Laterality: Bilateral;  Bilateral Lumbar four-five Decompression   POLYPECTOMY  01/15/2020   Procedure: POLYPECTOMY;  Surgeon: Jeani Hawking, MD;  Location: [facility];  Service: Endoscopy;;   TONSILLECTOMY     TRANSPHENOIDAL / TRANSNASAL HYPOPHYSECTOMY / RESECTION PITUITARY TUMOR     TRIGGER FINGER RELEASE     TUBAL LIGATION      FAMILY HISTORY: Family History  Problem Relation Age of Onset   Diabetes type II Mother    Diabetes type II Father    Heart disease Father    Heart attack Father    Colon cancer Maternal Grandmother    Heart disease Maternal Grandfather     SOCIAL HISTORY:  Social History   Socioeconomic History   Marital status: Married    Spouse name: Not on file   Number of children: Not on file   Years  of education: Not on file   Highest education level: Not on file  Occupational History   Not on file  Tobacco Use   Smoking status: Former     Packs/day: 1.00    Years: 32.00    Pack years: 32.00    Types: Cigarettes    Quit date: 07/27/2006    Years since quitting: 15.4    Passive exposure: Past   Smokeless tobacco: Never  Vaping Use   Vaping Use: Never used  Substance and Sexual Activity   Alcohol use: Yes    Comment: occ   Drug use: No   Sexual activity: Not on file  Other Topics Concern   Not on file  Social History Narrative   Not on file   Social Determinants of Health   Financial Resource Strain: Not on file  Food Insecurity: Not on file  Transportation Needs: Not on file  Physical Activity: Not on file  Stress: Not on file  Social Connections: Not on file  Intimate Partner Violence: Not on file     PHYSICAL EXAM  Vitals:   12/26/21 1515  BP: 128/60  Pulse: 82  SpO2: 96%  Weight: 256 lb (116.1 kg)  Height: 5\' 3"  (1.6 m)    Body mass index is 45.35 kg/m.   General: The patient is an obese woman in NAD.      Skin: Extremities are without rash or edema.  Musculoskeletal: Mild tenderness over the classic fibromyalgia tender points of the upper chest and also inner thighs.  Mild joint enlargement in the hands     Neurologic Exam  Mental status: The patient is alert and oriented x 3 at the time of the examination. The patient has apparent normal recent and remote memory, with an apparently normal attention span and concentration ability.   Speech is normal.  Cranial nerves: Extraocular movements are full.   There is good facial sensation to soft touch bilaterally.Facial strength is normal.  Trapezius and sternocleidomastoid strength is normal. No dysarthria is noted.  Hearing is normal.  Motor:  Muscle bulk is normal.   Tone is normal. Strength is  5 / 5 in all 4 extremities.   Sensory: She has intact sensation to touch and vibration in the arms and hands.  She has reduced vibration sensation at the ankles compared to the knees.  There is allodynia and altered sensation over the dorsum of both  feet, worse on the left..     Coordination: Cerebellar testing reveals good finger-nose-finger and heel-to-shin  Gait and station: Station is normal.   Gait is normal.  The tandem gait is mildly wide.  Romberg is negative.  Reflexes: Deep tendon reflexes are symmetric and 1+ bilaterally.        DIAGNOSTIC DATA (LABS, IMAGING, TESTING) - I reviewed patient records, labs, notes, testing and imaging myself where available.  Lab Results  Component Value Date   WBC 8.0 10/31/2021   HGB 14.1 10/31/2021   HCT 43.0 10/31/2021   MCV 87.8 10/31/2021   PLT 202 10/31/2021      Component Value Date/Time   NA 139 10/31/2021 1445   K 4.2 10/31/2021 1445   CL 103 10/31/2021 1445   CO2 25 10/31/2021 1445   GLUCOSE 110 (H) 10/31/2021 1445   BUN 19 10/31/2021 1445   CREATININE 0.71 10/31/2021 1445   CALCIUM 9.0 10/31/2021 1445   PROT 7.4 10/31/2021 1445   PROT 7.3 10/31/2021 1445   PROT 7.5 09/16/2019 1623   ALBUMIN  3.7 05/20/2019 0128   AST 19 10/31/2021 1445   ALT 17 10/31/2021 1445   ALKPHOS 65 05/20/2019 0128   BILITOT 0.4 10/31/2021 1445   GFRNONAA 96 01/05/2021 1611   GFRAA 111 01/05/2021 1611      ASSESSMENT AND PLAN  Neuropathy  Mixed connective tissue disease (HCC)  MGUS (monoclonal gammopathy of unknown significance)  OSA (obstructive sleep apnea)  Depression with anxiety  Insomnia, unspecified type   1.   Continue Horizant for the polyneuropathy.    I will increase the Horizant to 3 pills a day in the hopes that it would help the dysesthesias better.  Unfortunately, other medications have not been beneficial.   2.   Continue the lidocaine ointment.  New prescription was provided.   3.   Stay active and exercises as tolerated.  4.   Continue nocturnal O2 for nocturnal hypoxemia 5.   RTC 12 months, sooner if she has new or worsening neurologic symptoms.  40 minutes office visit with the majority of the time spent face-to-face for history and physical,  discussion/counseling and decision-making.  Additional time with record review and documentation.    Wayde Gopaul A. Epimenio Foot, MD, PhD 12/26/2021, 4:14 PM Certified in Neurology, Clinical Neurophysiology, Sleep Medicine, Pain Medicine and Neuroimaging  [facility] [address], [address] [place], [place] [address] [phone]  7

## 2022-12-28 ENCOUNTER — Other Ambulatory Visit: Payer: Self-pay | Admitting: Physician Assistant

## 2022-12-28 DIAGNOSIS — M351 Other overlap syndromes: Secondary | ICD-10-CM

## 2022-12-31 ENCOUNTER — Encounter: Payer: Self-pay | Admitting: *Deleted

## 2022-12-31 NOTE — Telephone Encounter (Signed)
Last Fill: 10/31/2021  Eye exam: 11/10/2021 WNL    Labs: 09/05/2022 CBC normal, CMP showed elevated glucose at 139 probably not a fasting sample.   Next Visit: 02/05/2023  Last Visit: 09/05/2022  DX: Mixed connective tissue disease    Current Dose per office note 09/05/2022: Plaquenil 200 mg 1 tablet by mouth twice daily.   Message sent to patient via my chart to advise patient she is due to update her PLQ eye exam.   Okay to refill Plaquenil?

## 2023-01-04 LAB — MULTIPLE MYELOMA PANEL, SERUM
Albumin SerPl Elph-Mcnc: 3.4 g/dL (ref 2.9–4.4)
Albumin/Glob SerPl: 1 (ref 0.7–1.7)
Alpha 1: 0.3 g/dL (ref 0.0–0.4)
Alpha2 Glob SerPl Elph-Mcnc: 1 g/dL (ref 0.4–1.0)
B-Globulin SerPl Elph-Mcnc: 1 g/dL (ref 0.7–1.3)
Gamma Glob SerPl Elph-Mcnc: 1.5 g/dL (ref 0.4–1.8)
Globulin, Total: 3.7 g/dL (ref 2.2–3.9)
IgA/Immunoglobulin A, Serum: 314 mg/dL (ref 87–352)
IgG (Immunoglobin G), Serum: 1459 mg/dL (ref 586–1602)
IgM (Immunoglobulin M), Srm: 137 mg/dL (ref 26–217)
Total Protein: 7.1 g/dL (ref 6.0–8.5)

## 2023-01-22 NOTE — Progress Notes (Unsigned)
Office Visit Note  Patient: Gloria Porter             Date of Birth: [birth year]           MRN: [id number]             PCP: Loleta Books (Inactive) Referring: No ref. provider found Visit Date: 02/05/2023 Occupation: @GUAROCC @  Subjective:  Medication monitoring   History of Present Illness: Gloria Porter is a 67 y.o. female with history of mixed connective tissue disease and osteoarthritis.  Patient remains on plaquenil 200 mg 1 tablet by mouth twice daily.  She states that most days she is only taking Plaquenil once daily due to issues remembering to take her evening dose.  She has been tolerating Plaquenil without any side effects.  She has not noticed any new or worsening symptoms since only taking Plaquenil once daily for the past few months.  Patient continues to experience intermittent arthralgias and joint stiffness.  She typically applies a heating pad or devices which she can heat in the microwave to soothe her discomfort.  She remains under the care of neurology for management of neuropathy and is using lidocaine cream on her both feet.  She denies any symptoms of Raynaud's phenomenon at this time.  She has not had any recent oral or nasal ulcerations.  She continues to have chronic sicca symptoms.  She has been using refresh or Systane eyedrops for dry eyes.  According to the patient she currently has a blocked tear duct in her left eye and will have to undergo a procedure soon.   Activities of Daily Living:  Patient reports morning stiffness for a few minutes.   Patient Reports nocturnal pain.  Difficulty dressing/grooming: Denies Difficulty climbing stairs: Reports Difficulty getting out of chair: Reports Difficulty using hands for taps, buttons, cutlery, and/or writing: Denies  Review of Systems  Constitutional:  Positive for fatigue.  HENT:  Positive for mouth dryness. Negative for mouth sores.   Eyes:  Positive for discharge and dryness.  Respiratory:   Positive for shortness of breath.        With exertion   Cardiovascular:  Negative for chest pain and palpitations.  Gastrointestinal:  Negative for blood in stool, constipation and diarrhea.  Endocrine: Negative for increased urination.  Genitourinary:  Negative for involuntary urination.  Musculoskeletal:  Positive for gait problem, joint swelling, muscle weakness, morning stiffness and muscle tenderness. Negative for joint pain, joint pain, myalgias and myalgias.  Skin:  Positive for rash, hair loss and redness. Negative for color change and sensitivity to sunlight.  Allergic/Immunologic: Negative for susceptible to infections.  Neurological:  Positive for numbness. Negative for dizziness and headaches.  Hematological:  Negative for swollen glands.  Psychiatric/Behavioral:  Negative for depressed mood and sleep disturbance. The patient is nervous/anxious.     PMFS History:  Patient Active Problem List   Diagnosis Date Noted   History of lumbar surgery 09/16/2019   Pseudoarthrosis of lumbar spine 03/02/2019   Primary osteoarthritis of both hands 04/10/2017   Primary osteoarthritis of both knees 04/10/2017   DDD (degenerative disc disease), lumbar 04/10/2017   Mixed connective tissue disease (HCC) 04/10/2017   S/P trigger finger release bilateral third 03/14/2017   MGUS (monoclonal gammopathy of unknown significance) 03/11/2017   History of chronic kidney disease 03/11/2017   Monoclonal paraproteinemia 01/11/2016   Restless leg 01/11/2015   Carpal tunnel syndrome 12/28/2014   Dysphagia, oropharyngeal 12/28/2014   Anxiety, generalized 10/14/2014   Generalized  joint pain 08/17/2014   Encounter for therapeutic drug monitoring 07/13/2014   Failed back syndrome of lumbar spine 07/13/2014   Encounter for therapeutic drug level monitoring 07/13/2014   ACTH excess, central (HCC) 06/16/2014   Diabetes (HCC) 06/16/2014   Fibrositis 06/16/2014   Cannot sleep 06/16/2014   Extreme obesity  06/16/2014   Neuropathy 06/16/2014   Allergic rhinitis, seasonal 06/16/2014   Post menopausal syndrome 06/16/2014   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   Spinal stenosis 06/16/2014   Adult hypothyroidism 06/16/2014   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HCC) 06/16/2014   Diabetes mellitus (HCC) 06/16/2014   Fibromyalgia 06/16/2014   Morbid obesity (HCC) 06/16/2014   Sjogren's syndrome (HCC) 06/16/2014   Postmenopausal HRT (hormone replacement therapy) 06/16/2014   Breath shortness 01/21/2014   Essential (primary) hypertension 01/21/2014   Acid reflux 01/21/2014   Adiposity 01/21/2014   Depression with anxiety 08/26/2013   OSA (obstructive sleep apnea) 01/17/2013   Cushing's syndrome (HCC) 05/23/2011   Pituitary adenoma (HCC) 05/23/2011    Past Medical History:  Diagnosis Date   Anxiety    Arthritis    Cushing's disease (HCC)    pituitary tumor removed 2012   DDD (degenerative disc disease), lumbar    Diabetes mellitus    Endocrinologist Dr. Corwin Levins   Difficult intubation    2011 scratched trachea   Dyspnea    upon exertion   Fibromyalgia    Gastroparesis    GERD (gastroesophageal reflux disease)    Headache(784.0)    Hypertension    Hypothyroidism    Lumbar disc disease    MGUS (monoclonal gammopathy of unknown significance)    Mixed connective tissue disease (HCC)    Neuropathy    Pneumonia    Sjogren's disease (HCC)    Sleep apnea    no cpap.sleep study 2006 [facility]    Family History  Problem Relation Age of Onset   Diabetes type II Mother    Diabetes type II Father    Heart disease Father    Heart attack Father    Colon cancer Maternal Grandmother    Heart disease Maternal Grandfather    Past Surgical History:  Procedure Laterality Date   APPENDECTOMY     BACK SURGERY     lumbar fusion   BIOPSY  01/15/2020   Procedure: BIOPSY;  Surgeon: Jeani Hawking, MD;  Location: [facility];  Service: Endoscopy;;   BRAIN SURGERY     For cushings    CARPAL TUNNEL RELEASE Bilateral    CATARACT EXTRACTION, BILATERAL     CESAREAN SECTION  10/31/1973   COLONOSCOPY     COLONOSCOPY WITH PROPOFOL N/A 01/15/2020   Procedure: COLONOSCOPY WITH PROPOFOL;  Surgeon: Jeani Hawking, MD;  Location: [facility];  Service: Endoscopy;  Laterality: N/A;   COMBINED HYSTEROSCOPY DIAGNOSTIC / D&C     ENDOMETRIAL ABLATION     ESOPHAGOGASTRODUODENOSCOPY (EGD) WITH PROPOFOL N/A 01/15/2020   Procedure: ESOPHAGOGASTRODUODENOSCOPY (EGD) WITH PROPOFOL;  Surgeon: Jeani Hawking, MD;  Location: [facility];  Service: Endoscopy;  Laterality: N/A;   ESOPHAGOGASTRODUODENOSCOPY ENDOSCOPY     FRACTURE SURGERY     l arm   LUMBAR LAMINECTOMY/DECOMPRESSION MICRODISCECTOMY  11/13/2011   Procedure: LUMBAR LAMINECTOMY/DECOMPRESSION MICRODISCECTOMY;  Surgeon: Reinaldo Meeker, MD;  Location: [facility];  Service: Neurosurgery;  Laterality: Bilateral;  Bilateral Lumbar four-five Decompression   POLYPECTOMY  01/15/2020   Procedure: POLYPECTOMY;  Surgeon: Jeani Hawking, MD;  Location: [facility];  Service: Endoscopy;;   TONSILLECTOMY     TRANSPHENOIDAL / TRANSNASAL HYPOPHYSECTOMY /  RESECTION PITUITARY TUMOR     TRIGGER FINGER RELEASE     TUBAL LIGATION     Social History   Social History Narrative   Not on file   Immunization History  Administered Date(s) Administered   Covid-19, Mrna,Vaccine(Spikevax)19yrs and older 06/19/2022   Influenza Inj Mdck Quad Pf 07/12/2017, 07/14/2018   Influenza Split 06/13/2017   Moderna Covid-19 Vaccine Bivalent Booster 73yrs & up 06/05/2021   Moderna Sars-Covid-2 Vaccination 11/03/2019, 12/01/2019, 06/16/2020, 12/16/2020   Pneumococcal Conjugate-13 12/28/2014   Pneumococcal Polysaccharide-23 01/03/2017     Objective: Vital Signs: BP (!) 154/72 (BP Location: Right Arm, Patient Position: Sitting, Cuff Size: Large)   Pulse 71   Ht 5\' 3"  (1.6 m)   Wt 267 lb 9.6 oz (121.4 kg)   LMP 11/08/2006   BMI 47.40 kg/m    Physical Exam Vitals and  nursing note reviewed.  Constitutional:      Appearance: She is well-developed.  HENT:     Head: Normocephalic and atraumatic.  Eyes:     Conjunctiva/sclera: Conjunctivae normal.  Cardiovascular:     Rate and Rhythm: Normal rate and regular rhythm.     Heart sounds: Normal heart sounds.  Pulmonary:     Effort: Pulmonary effort is normal.     Breath sounds: Normal breath sounds.  Abdominal:     General: Bowel sounds are normal.     Palpations: Abdomen is soft.  Musculoskeletal:     Cervical back: Normal range of motion.  Lymphadenopathy:     Cervical: No cervical adenopathy.  Skin:    General: Skin is warm and dry.     Capillary Refill: Capillary refill takes less than 2 seconds.  Neurological:     Mental Status: She is alert and oriented to person, place, and time.  Psychiatric:        Behavior: Behavior normal.      Musculoskeletal Exam: C-spine has slightly limited range of motion.  Some trapezius muscle tension tenderness bilaterally.  Postural thoracic kyphosis noted.  Shoulder joints, elbow joints, wrist joints, MCPs, PIPs, DIPs have good range of motion with no synovitis.  PIP and DIP thickening consistent with osteoarthritis of both hands.  Hip joints have good range of motion with no groin pain.  No tenderness over the trochanteric bursa bilaterally.  Knee joints have good range of motion with no warmth or effusion.  Ankle joints have good range of motion with no tenderness or joint swelling.  CDAI Exam: CDAI Score: -- Patient Global: --; Provider Global: -- Swollen: --; Tender: -- Joint Exam 02/05/2023   No joint exam has been documented for this visit   There is currently no information documented on the homunculus. Go to the Rheumatology activity and complete the homunculus joint exam.  Investigation: No additional findings.  Imaging: No results found.  Recent Labs: Lab Results  Component Value Date   WBC 6.3 09/05/2022   HGB 13.0 09/05/2022   PLT 180  09/05/2022   NA 141 09/05/2022   K 4.3 09/05/2022   CL 103 09/05/2022   CO2 30 09/05/2022   GLUCOSE 139 (H) 09/05/2022   BUN 12 09/05/2022   CREATININE 0.64 09/05/2022   BILITOT 0.3 09/05/2022   ALKPHOS 65 05/20/2019   AST 19 09/05/2022   ALT 17 09/05/2022   PROT 7.1 12/27/2022   ALBUMIN 3.7 05/20/2019   CALCIUM 9.1 09/05/2022   GFRAA 111 01/05/2021    Speciality Comments: PLQ eye exam: 01/21/2023 [facility] [place]  Follow up in 1 year  Procedures:  No procedures performed Allergies: Codeine, Methotrexate, Atorvastatin, and Quinapril hcl     Assessment / Plan:     Visit Diagnoses: Mixed connective tissue disease (HCC) - ANA 1:160 NS, RNP positive, inflammatory arthritis: She has not had any signs or symptoms of a flare.  She is prescribed Plaquenil 200 mg 1 tablet by mouth twice daily.  According to the patient over the past 2 to 3 months she has been only taking Plaquenil once daily due to forgetting to take the evening dose.  She continues to tolerate Plaquenil without any side effects and has not necessarily noticed any new or worsening symptoms on the reduced dose of Plaquenil.  Discussed that she can remain on low-dose Plaquenil once daily but the patient requested a refill for Plaquenil twice daily. On examination no synovitis was noted.  She has been using topical agents and applying heat as needed for symptomatic relief.  She has not had any oral or nasal ulcerations.  No Maller rash noted.  No cervical lymphadenopathy was noted.  No parotid swelling or tenderness noted.  She continues to have chronic sicca symptoms and has over-the-counter products for symptomatic relief. Under care of Dr. Wallace Cullens. Skin biopsy 12/26/2022 psoriasiform subacute antibiotic dermatitis--psoriasis not definitively identified.  No further action required. Lab work from 09/05/22: ESR 36, complements WNL, dsDNA negative, and protein creatinine ratio WNL.  CMP was within normal limits on 01/24/2023.   UA did not reveal any proteinuria on 01/24/2023.  Multiple myeloma panel did not reveal any abnormal protein bands or evidence of monoclonal proteins on 12/27/2022. The following lab work will be obtained today for further evaluation.  She will remain on Plaquenil as prescribed.  A refill of Plaquenil sent to the pharmacy today.  She is advised to notify us if she develops any signs or symptoms of a flare.  She will follow-up in the office in 5 months or sooner if needed. . - Plan: CBC with Differential/Platelet, Anti-DNA antibody, double-stranded, C3 and C4, Sedimentation rate, ANA, hydroxychloroquine (PLAQUENIL) 200 MG tablet  High risk medication use - Plaquenil 200 mg 1 tablet by mouth twice daily.  CMP updated on 01/24/23.  CBC updated today.   PLQ eye exam: 01/21/2023 [facility] [place] Follow up in 1 year   - Plan: CBC with Differential/Platelet  Sicca complex (HCC) - Ro-, La-: She continues to have chronic sicca symptoms.  She has been using refresh or Systane eyedrops for dry eyes.  She has oral rinse for dry mouth which she uses as needed.  She has no parotid swelling or tenderness on examination today.  No salivary stones.  No cervical lymphadenopathy was noted.  Multiple myeloma panel was unremarkable on 12/27/2022.  No new or worsening pulmonary symptoms.  Primary osteoarthritis of both hands: She has PIP and DIP thickening consistent with osteoarthritis of both hands.  She uses hand warmers as needed if she experiences increased discomfort or joint stiffness.  She has no synovitis on examination today.  Primary osteoarthritis of both knees: Both knee joints have good range of motion with no warmth or effusion.  Trochanteric bursitis, left hip: Not currently symptomatic.  No tenderness upon palpation.  DDD (degenerative disc disease), lumbar: Status post fusion. Limited mobility.   Fibromyalgia: Patient continues to experience intermittent myalgias and muscle tenderness due  to fibromyalgia.  She tends to use topical agents as well as applying heat for pain relief.  Discussed the  importance of regular exercise and good sleep hygiene.  Other fatigue: Chronic, stable.  Discussed the importance of regular exercise and good sleep hygiene.  Other medical conditions are listed as follows:   MGUS (monoclonal gammopathy of unknown significance) - Followed by oncology.  Multiple myeloma panel updated on 12/27/2022 did not reveal any evidence of monoclonal proteins.  History of diabetes mellitus  History of hypothyroidism  History of anxiety  History of cholecystectomy  History of depression  History of Cushing disease  OSA (obstructive sleep apnea) - She is followed by Dr. Su Monks. Using 2L of oxygen at night.  History of chronic pain   Orders: Orders Placed This Encounter  Procedures   CBC with Differential/Platelet   Anti-DNA antibody, double-stranded   C3 and C4   Sedimentation rate   ANA   Meds ordered this encounter  Medications   hydroxychloroquine (PLAQUENIL) 200 MG tablet    Sig: TAKE 1 TABLET TWICE A DAY FOR MIXED CONNECTIVE TISSUE DISEASE    Dispense:  180 tablet    Refill:  0     Follow-Up Instructions: Return in about 5 months (around 07/08/2023) for MCTD.   Gearldine Bienenstock, PA-C  Note - This record has been created using Dragon software.  Chart creation errors have been sought, but may not always  have been located. Such creation errors do not reflect on  the standard of medical care.

## 2023-02-05 ENCOUNTER — Encounter: Payer: Self-pay | Admitting: Physician Assistant

## 2023-02-05 ENCOUNTER — Ambulatory Visit: Payer: Medicare Other | Attending: Physician Assistant | Admitting: Physician Assistant

## 2023-02-05 VITALS — BP 154/72 | HR 71 | Ht 63.0 in | Wt 267.6 lb

## 2023-02-05 DIAGNOSIS — G4733 Obstructive sleep apnea (adult) (pediatric): Secondary | ICD-10-CM | POA: Diagnosis present

## 2023-02-05 DIAGNOSIS — M797 Fibromyalgia: Secondary | ICD-10-CM | POA: Insufficient documentation

## 2023-02-05 DIAGNOSIS — D472 Monoclonal gammopathy: Secondary | ICD-10-CM | POA: Insufficient documentation

## 2023-02-05 DIAGNOSIS — Z87898 Personal history of other specified conditions: Secondary | ICD-10-CM | POA: Insufficient documentation

## 2023-02-05 DIAGNOSIS — M19041 Primary osteoarthritis, right hand: Secondary | ICD-10-CM | POA: Diagnosis present

## 2023-02-05 DIAGNOSIS — Z9049 Acquired absence of other specified parts of digestive tract: Secondary | ICD-10-CM | POA: Diagnosis present

## 2023-02-05 DIAGNOSIS — M7062 Trochanteric bursitis, left hip: Secondary | ICD-10-CM | POA: Insufficient documentation

## 2023-02-05 DIAGNOSIS — Z8659 Personal history of other mental and behavioral disorders: Secondary | ICD-10-CM | POA: Diagnosis present

## 2023-02-05 DIAGNOSIS — M3500 Sicca syndrome, unspecified: Secondary | ICD-10-CM | POA: Insufficient documentation

## 2023-02-05 DIAGNOSIS — M170 Bilateral primary osteoarthritis of knee: Secondary | ICD-10-CM | POA: Diagnosis present

## 2023-02-05 DIAGNOSIS — Z79899 Other long term (current) drug therapy: Secondary | ICD-10-CM | POA: Insufficient documentation

## 2023-02-05 DIAGNOSIS — M5136 Other intervertebral disc degeneration, lumbar region: Secondary | ICD-10-CM | POA: Diagnosis present

## 2023-02-05 DIAGNOSIS — R5383 Other fatigue: Secondary | ICD-10-CM | POA: Insufficient documentation

## 2023-02-05 DIAGNOSIS — M351 Other overlap syndromes: Secondary | ICD-10-CM | POA: Insufficient documentation

## 2023-02-05 DIAGNOSIS — Z8639 Personal history of other endocrine, nutritional and metabolic disease: Secondary | ICD-10-CM | POA: Insufficient documentation

## 2023-02-05 DIAGNOSIS — M19042 Primary osteoarthritis, left hand: Secondary | ICD-10-CM | POA: Insufficient documentation

## 2023-02-05 LAB — CBC WITH DIFFERENTIAL/PLATELET
Basophils Relative: 0.7 %
Eosinophils Absolute: 159 cells/uL (ref 15–500)
Eosinophils Relative: 2.6 %
MCV: 89.6 fL (ref 80.0–100.0)

## 2023-02-05 MED ORDER — HYDROXYCHLOROQUINE SULFATE 200 MG PO TABS
ORAL_TABLET | ORAL | 0 refills | Status: DC
Start: 2023-02-05 — End: 2023-07-17

## 2023-02-06 LAB — CBC WITH DIFFERENTIAL/PLATELET
Absolute Monocytes: 476 cells/uL (ref 200–950)
Lymphs Abs: 1226 cells/uL (ref 850–3900)
MCHC: 32.2 g/dL (ref 32.0–36.0)
MPV: 10.6 fL (ref 7.5–12.5)
Monocytes Relative: 7.8 %
Platelets: 174 10*3/uL (ref 140–400)
RBC: 4.41 10*6/uL (ref 3.80–5.10)
Total Lymphocyte: 20.1 %

## 2023-02-06 LAB — C3 AND C4: C4 Complement: 21 mg/dL (ref 15–57)

## 2023-02-06 LAB — ANTI-DNA ANTIBODY, DOUBLE-STRANDED: ds DNA Ab: <1 IU/mL

## 2023-02-07 NOTE — Progress Notes (Signed)
ESR remains elevated.  CBC WNL Complements WNL dsDNA is negative.  ANA pending

## 2023-02-08 LAB — CBC WITH DIFFERENTIAL/PLATELET
Basophils Absolute: 43 cells/uL (ref 0–200)
HCT: 39.5 % (ref 35.0–45.0)
Hemoglobin: 12.7 g/dL (ref 11.7–15.5)
MCH: 28.8 pg (ref 27.0–33.0)
Neutro Abs: 4197 cells/uL (ref 1500–7800)
Neutrophils Relative %: 68.8 %
RDW: 13.9 % (ref 11.0–15.0)
WBC: 6.1 10*3/uL (ref 3.8–10.8)

## 2023-02-08 LAB — C3 AND C4: C3 Complement: 159 mg/dL (ref 83–193)

## 2023-02-08 LAB — ANTI-NUCLEAR AB-TITER (ANA TITER): ANA Titer 1: 1:40 {titer} — ABNORMAL HIGH

## 2023-02-08 LAB — ANA: Anti Nuclear Antibody (ANA): POSITIVE — AB

## 2023-02-08 LAB — SEDIMENTATION RATE: Sed Rate: 50 mm/h — ABNORMAL HIGH (ref 0–30)

## 2023-02-08 NOTE — Progress Notes (Signed)
ANA is positive-low titer-nonspecific.  No change in therapy recommended at this time

## 2023-07-03 NOTE — Progress Notes (Unsigned)
Office Visit Note  Patient: Gloria Porter             Date of Birth: May 21, 1956           MRN: [id number]             PCP: Loleta Books (Inactive) Referring: No ref. provider found Visit Date: 07/17/2023 Occupation: @GUAROCC @  Subjective:  Trochanteric bursitis of both hips   History of Present Illness: Gloria Porter is a 67 y.o. female with history of mixed connective tissue disease.  Patient remains on  Plaquenil 200 mg 1 tablet by mouth twice daily.  She is tolerating Plaquenil without any side effects and has requested a refill be sent to the pharmacy today.  She denies any signs or symptoms of a flare recently.  Patient has been under the care of orthopedics for discomfort in both hips secondary to trochanteric bursitis.  She had bilateral trochanteric bursa cortisone injections performed on Monday and has started physical therapy which she will be going to twice a week.  She has difficulty standing for prolonged periods of time due to discomfort in her lower back and both hips.  Patient also experiences intermittent pain and swelling in both hands.  She has difficulty with fine motor skills at times. She denies any recent rashes.  She continues to have chronic sicca symptoms.  According to the patient she has blocked tear ducts in the left eye and has been referred to a specialist but is unable to be seen until March 2025.  Patient continues to try to follow-up with her dentist every 6 months but will need to reestablish care with a new dentist in network going forward.   Activities of Daily Living:  Patient reports morning stiffness for 30-60  minutes.   Patient Denies nocturnal pain.  Difficulty dressing/grooming: Reports Difficulty climbing stairs: Reports Difficulty getting out of chair: Reports Difficulty using hands for taps, buttons, cutlery, and/or writing: Reports  Review of Systems  Constitutional:  Positive for fatigue.  HENT:  Positive for mouth dryness.  Negative for mouth sores.   Eyes:  Positive for dryness.  Respiratory:  Negative for shortness of breath.   Cardiovascular:  Negative for palpitations.  Gastrointestinal:  Positive for constipation and diarrhea. Negative for blood in stool.  Endocrine: Positive for increased urination.  Genitourinary:  Positive for involuntary urination.  Musculoskeletal:  Positive for joint pain, joint pain, joint swelling, myalgias, muscle weakness, morning stiffness and myalgias. Negative for gait problem and muscle tenderness.  Skin:  Positive for color change. Negative for rash, hair loss and sensitivity to sunlight.  Allergic/Immunologic: Negative for susceptible to infections.  Neurological:  Positive for numbness. Negative for dizziness and headaches.  Hematological:  Negative for swollen glands.  Psychiatric/Behavioral:  Positive for depressed mood and sleep disturbance. The patient is nervous/anxious.     PMFS History:  Patient Active Problem List   Diagnosis Date Noted   History of lumbar surgery 09/16/2019   Pseudoarthrosis of lumbar spine 03/02/2019   Primary osteoarthritis of both hands 04/10/2017   Primary osteoarthritis of both knees 04/10/2017   DDD (degenerative disc disease), lumbar 04/10/2017   Mixed connective tissue disease (HCC) 04/10/2017   S/P trigger finger release bilateral third 03/14/2017   MGUS (monoclonal gammopathy of unknown significance) 03/11/2017   History of chronic kidney disease 03/11/2017   Monoclonal paraproteinemia 01/11/2016   Restless leg 01/11/2015   Carpal tunnel syndrome 12/28/2014   Dysphagia, oropharyngeal 12/28/2014   Anxiety, generalized 10/14/2014  Generalized joint pain 08/17/2014   Encounter for therapeutic drug monitoring 07/13/2014   Failed back syndrome of lumbar spine 07/13/2014   Encounter for therapeutic drug level monitoring 07/13/2014   ACTH excess, central (HCC) 06/16/2014   Diabetes (HCC) 06/16/2014   Fibrositis 06/16/2014    Cannot sleep 06/16/2014   Extreme obesity 06/16/2014   Neuropathy 06/16/2014   Allergic rhinitis, seasonal 06/16/2014   Post menopausal syndrome 06/16/2014   Sjogren's syndrome (HCC) 06/16/2014   Spinal stenosis 06/16/2014   Adult hypothyroidism 06/16/2014   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HCC) 06/16/2014   Diabetes mellitus (HCC) 06/16/2014   Fibromyalgia 06/16/2014   Morbid obesity (HCC) 06/16/2014   Sjogren's syndrome (HCC) 06/16/2014   Postmenopausal HRT (hormone replacement therapy) 06/16/2014   Breath shortness 01/21/2014   Essential (primary) hypertension 01/21/2014   Acid reflux 01/21/2014   Adiposity 01/21/2014   Depression with anxiety 08/26/2013   OSA (obstructive sleep apnea) 01/17/2013   Cushing's syndrome (HCC) 05/23/2011   Pituitary adenoma (HCC) 05/23/2011    Past Medical History:  Diagnosis Date   Anxiety    Arthritis    Cushing's disease (HCC)    pituitary tumor removed 2012   DDD (degenerative disc disease), lumbar    Diabetes mellitus    Endocrinologist Dr. Corwin Levins   Difficult intubation    2011 scratched trachea   Dyspnea    upon exertion   Fibromyalgia    Gastroparesis    GERD (gastroesophageal reflux disease)    Headache(784.0)    Hypertension    Hypothyroidism    Lumbar disc disease    MGUS (monoclonal gammopathy of unknown significance)    Mixed connective tissue disease (HCC)    Neuropathy    Pneumonia    Sjogren's disease (HCC)    Sleep apnea    no cpap.sleep study 2006 [facility]    Family History  Problem Relation Age of Onset   Diabetes type II Mother    Diabetes type II Father    Heart disease Father    Heart attack Father    Colon cancer Maternal Grandmother    Heart disease Maternal Grandfather    Past Surgical History:  Procedure Laterality Date   APPENDECTOMY     BACK SURGERY     lumbar fusion   BIOPSY  01/15/2020   Procedure: BIOPSY;  Surgeon: Jeani Hawking, MD;  Location: [facility];  Service: Endoscopy;;    BRAIN SURGERY     For cushings   CARPAL TUNNEL RELEASE Bilateral    CATARACT EXTRACTION, BILATERAL     CESAREAN SECTION  10/31/1973   COLONOSCOPY     COLONOSCOPY WITH PROPOFOL N/A 01/15/2020   Procedure: COLONOSCOPY WITH PROPOFOL;  Surgeon: Jeani Hawking, MD;  Location: [facility];  Service: Endoscopy;  Laterality: N/A;   COMBINED HYSTEROSCOPY DIAGNOSTIC / D&C     ENDOMETRIAL ABLATION     ESOPHAGOGASTRODUODENOSCOPY (EGD) WITH PROPOFOL N/A 01/15/2020   Procedure: ESOPHAGOGASTRODUODENOSCOPY (EGD) WITH PROPOFOL;  Surgeon: Jeani Hawking, MD;  Location: [facility];  Service: Endoscopy;  Laterality: N/A;   ESOPHAGOGASTRODUODENOSCOPY ENDOSCOPY     FRACTURE SURGERY     l arm   LUMBAR LAMINECTOMY/DECOMPRESSION MICRODISCECTOMY  11/13/2011   Procedure: LUMBAR LAMINECTOMY/DECOMPRESSION MICRODISCECTOMY;  Surgeon: Reinaldo Meeker, MD;  Location: [facility];  Service: Neurosurgery;  Laterality: Bilateral;  Bilateral Lumbar four-five Decompression   POLYPECTOMY  01/15/2020   Procedure: POLYPECTOMY;  Surgeon: Jeani Hawking, MD;  Location: [facility];  Service: Endoscopy;;   TONSILLECTOMY     TRANSPHENOIDAL / TRANSNASAL  HYPOPHYSECTOMY / RESECTION PITUITARY TUMOR     TRIGGER FINGER RELEASE     TUBAL LIGATION     Social History   Social History Narrative   Not on file   Immunization History  Administered Date(s) Administered   Influenza Inj Mdck Quad Pf 07/12/2017, 07/14/2018   Influenza Split 06/13/2017   Moderna Covid-19 Fall Seasonal Vaccine 42yrs & older 06/19/2022   Moderna Covid-19 Vaccine Bivalent Booster 64yrs & up 06/05/2021   Moderna Sars-Covid-2 Vaccination 11/03/2019, 12/01/2019, 06/16/2020, 12/16/2020   Pneumococcal Conjugate-13 12/28/2014   Pneumococcal Polysaccharide-23 01/03/2017     Objective: Vital Signs: BP 129/70 (BP Location: Left Arm, Patient Position: Sitting, Cuff Size: Large)   Pulse 65   Ht 5\' 3"  (1.6 m)   Wt 266 lb 3.2 oz (120.7 kg)   LMP 11/08/2006   BMI 47.16  kg/m    Physical Exam Vitals and nursing note reviewed.  Constitutional:      Appearance: She is well-developed.  HENT:     Head: Normocephalic and atraumatic.  Eyes:     Conjunctiva/sclera: Conjunctivae normal.  Cardiovascular:     Rate and Rhythm: Normal rate and regular rhythm.     Heart sounds: Normal heart sounds.  Pulmonary:     Effort: Pulmonary effort is normal.     Breath sounds: Normal breath sounds.  Abdominal:     General: Bowel sounds are normal.     Palpations: Abdomen is soft.  Musculoskeletal:     Cervical back: Normal range of motion.  Lymphadenopathy:     Cervical: No cervical adenopathy.  Skin:    General: Skin is warm and dry.     Capillary Refill: Capillary refill takes less than 2 seconds.  Neurological:     Mental Status: She is alert and oriented to person, place, and time.  Psychiatric:        Behavior: Behavior normal.      Musculoskeletal Exam: C-spine has limited ROM.  Trapezius muscle tension and tenderness bilaterally.  Posture thoracic kyphosis noted.  Shoulder joints have good range of motion with some tenderness of the right subacromial bursa.  Elbow joints, wrist joints, MCPs, PIPs, DIPs have good range of motion with no synovitis.  Complete fist formation bilaterally.  PIP and DIP thickening noted.  Knee joints have good ROM with no warmth or effusion.  Ankle joints have good ROM with no tenderness or joint swelling.   CDAI Exam: CDAI Score: -- Patient Global: --; Provider Global: -- Swollen: --; Tender: -- Joint Exam 07/17/2023   No joint exam has been documented for this visit   There is currently no information documented on the homunculus. Go to the Rheumatology activity and complete the homunculus joint exam.  Investigation: No additional findings.  Imaging: No results found.  Recent Labs: Lab Results  Component Value Date   WBC 6.1 02/05/2023   HGB 12.7 02/05/2023   PLT 174 02/05/2023   NA 141 09/05/2022   K 4.3  09/05/2022   CL 103 09/05/2022   CO2 30 09/05/2022   GLUCOSE 139 (H) 09/05/2022   BUN 12 09/05/2022   CREATININE 0.64 09/05/2022   BILITOT 0.3 09/05/2022   ALKPHOS 65 05/20/2019   AST 19 09/05/2022   ALT 17 09/05/2022   PROT 7.1 12/27/2022   ALBUMIN 3.7 05/20/2019   CALCIUM 9.1 09/05/2022   GFRAA 111 01/05/2021    Speciality Comments: PLQ eye exam: 01/21/2023 [facility] [place] Follow up in 1 year  Procedures:  No procedures performed Allergies: Codeine, Methotrexate, Atorvastatin, and Quinapril hcl      Assessment / Plan:     Visit Diagnoses: Mixed connective tissue disease (HCC) - ANA 1:160 NS, RNP positive, inflammatory arthritis: She has not had any signs or symptoms of a flare recently.  She continues to take Plaquenil 200 mg 1 tablet by mouth twice daily.  A refill was sent to the pharmacy today.  She has no synovitis on examination today.  She continues to experience interval myalgias and muscle tenderness due to fibromyalgia.  She continues to have chronic fatigue and sicca symptoms.  She currently has blocked tear ducts involving the left eye and is scheduled to see a specialist in March 2025.  Discussed the use of over-the-counter products for symptomatic relief.  Encouraged patient to continue to see her dentist every 6 months. She has not had any symptoms of Raynaud's phenomenon recently.  No recent rashes.  No signs of sclerodactyly noted. No shortness of breath.  Lungs were clear to auscultation. Lab work from 02/05/23 was reviewed today in the office: ANA 1:40NS, dsDNA<1, ESR 50, complements WNL.  The following lab work will be updated today.   She will remain on Plaquenil as prescribed.  She was advised to notify us if she develops signs or symptoms of a flare.  She will follow-up in the office in 5 months or sooner if needed. - Plan: CBC with Differential/Platelet, Protein / creatinine ratio, urine, COMPLETE METABOLIC PANEL WITH GFR, Anti-DNA antibody,  double-stranded, Sedimentation rate, C3 and C4, hydroxychloroquine (PLAQUENIL) 200 MG tablet  High risk medication use - Plaquenil 200 mg 1 tablet by mouth twice daily.  PLQ eye exam: 01/21/2023 [facility] [place] Follow up in 1 year  CBC updated on 02/05/23. CMP updated on 01/24/23. Orders for CBC and CMP were released today.   - Plan: CBC with Differential/Platelet, COMPLETE METABOLIC PANEL WITH GFR  Sicca complex (HCC) - Ro-, La-: Patient continues to have chronic sicca symptoms.  According to the patient she has blocked tear ducts of the left eye and is scheduled to see a specialist in March 2025 to discuss treatment options.   Discussed the use of OTC products.   Discussed the importance of seeing a dentist every 6 months.   Primary osteoarthritis of both hands: PIP and DIP thickening.  No synovitis noted.  Primary osteoarthritis of both knees: She has good range of motion of both knee joints on examination today.  No warmth or effusion noted.  Trochanteric bursitis of both hips: Under the care of orthopedics.  Has started physical therapy which she will perform twice weekly patient had bilateral trochanteric bursa cortisone injections performed on Monday.  She was also prescribed Celebrex 200 mg twice daily for 1 week and then she will reduce to once daily for 1 week.  Degeneration of intervertebral disc of lumbar region without discogenic back pain or lower extremity pain - S/p fusion: She has difficulty standing for prolonged periods of time due to the discomfort in her lower back.  She has a rollator walker which she uses if needed.  Fibromyalgia: She has generalized myalgias and muscle tenderness due to fibromyalgia.  She has trapezius muscle tension tenderness bilaterally.  She has also been experiencing discomfort due to trochanter bursitis of both hips.  Discussed the importance of regular exercise and good sleep hygiene.  Other fatigue: Chronic, stable.  Discussed the  importance of regular exercise and good sleep hygiene.  Other  medical conditions are listed as follows:   MGUS (monoclonal gammopathy of unknown significance) - Followed by oncology.  Multiple myeloma panel updated on 12/27/2022 did not reveal any evidence of monoclonal proteins.  History of diabetes mellitus  History of hypothyroidism  History of anxiety  History of cholecystectomy  History of depression  History of Cushing disease  OSA (obstructive sleep apnea)   Orders: Orders Placed This Encounter  Procedures   CBC with Differential/Platelet   Protein / creatinine ratio, urine   COMPLETE METABOLIC PANEL WITH GFR   Anti-DNA antibody, double-stranded   Sedimentation rate   C3 and C4   Meds ordered this encounter  Medications   hydroxychloroquine (PLAQUENIL) 200 MG tablet    Sig: TAKE 1 TABLET TWICE A DAY FOR MIXED CONNECTIVE TISSUE DISEASE    Dispense:  180 tablet    Refill:  0   Follow-Up Instructions: Return in about 5 months (around 12/15/2023) for MCTD.   Gearldine Bienenstock, PA-C  Note - This record has been created using Dragon software.  Chart creation errors have been sought, but may not always  have been located. Such creation errors do not reflect on  the standard of medical care.

## 2023-07-17 ENCOUNTER — Encounter: Payer: Self-pay | Admitting: Physician Assistant

## 2023-07-17 ENCOUNTER — Ambulatory Visit: Payer: Medicare Other | Attending: Physician Assistant | Admitting: Physician Assistant

## 2023-07-17 VITALS — BP 129/70 | HR 65 | Ht 63.0 in | Wt 266.2 lb

## 2023-07-17 DIAGNOSIS — R5383 Other fatigue: Secondary | ICD-10-CM | POA: Insufficient documentation

## 2023-07-17 DIAGNOSIS — G4733 Obstructive sleep apnea (adult) (pediatric): Secondary | ICD-10-CM | POA: Insufficient documentation

## 2023-07-17 DIAGNOSIS — Z8639 Personal history of other endocrine, nutritional and metabolic disease: Secondary | ICD-10-CM | POA: Insufficient documentation

## 2023-07-17 DIAGNOSIS — Z79899 Other long term (current) drug therapy: Secondary | ICD-10-CM | POA: Diagnosis present

## 2023-07-17 DIAGNOSIS — M351 Other overlap syndromes: Secondary | ICD-10-CM | POA: Diagnosis not present

## 2023-07-17 DIAGNOSIS — M3500 Sicca syndrome, unspecified: Secondary | ICD-10-CM | POA: Insufficient documentation

## 2023-07-17 DIAGNOSIS — M797 Fibromyalgia: Secondary | ICD-10-CM | POA: Diagnosis present

## 2023-07-17 DIAGNOSIS — M19041 Primary osteoarthritis, right hand: Secondary | ICD-10-CM | POA: Diagnosis present

## 2023-07-17 DIAGNOSIS — M170 Bilateral primary osteoarthritis of knee: Secondary | ICD-10-CM | POA: Diagnosis present

## 2023-07-17 DIAGNOSIS — M19042 Primary osteoarthritis, left hand: Secondary | ICD-10-CM | POA: Diagnosis present

## 2023-07-17 DIAGNOSIS — M51369 Other intervertebral disc degeneration, lumbar region without mention of lumbar back pain or lower extremity pain: Secondary | ICD-10-CM | POA: Diagnosis present

## 2023-07-17 DIAGNOSIS — M7062 Trochanteric bursitis, left hip: Secondary | ICD-10-CM | POA: Diagnosis present

## 2023-07-17 DIAGNOSIS — Z8659 Personal history of other mental and behavioral disorders: Secondary | ICD-10-CM | POA: Insufficient documentation

## 2023-07-17 DIAGNOSIS — M7061 Trochanteric bursitis, right hip: Secondary | ICD-10-CM | POA: Diagnosis present

## 2023-07-17 DIAGNOSIS — Z9049 Acquired absence of other specified parts of digestive tract: Secondary | ICD-10-CM | POA: Insufficient documentation

## 2023-07-17 DIAGNOSIS — D472 Monoclonal gammopathy: Secondary | ICD-10-CM | POA: Diagnosis present

## 2023-07-17 MED ORDER — HYDROXYCHLOROQUINE SULFATE 200 MG PO TABS: ORAL_TABLET | ORAL | 0 refills | Status: DC

## 2023-07-18 NOTE — Progress Notes (Signed)
CMP stable. CBC WNL.  Complements WNL  ESR remains elevated but stable.

## 2023-07-19 LAB — COMPLETE METABOLIC PANEL WITH GFR
AG Ratio: 1.1 (calc) (ref 1.0–2.5)
ALT: 15 U/L (ref 6–29)
AST: 18 U/L (ref 10–35)
Albumin: 4 g/dL (ref 3.6–5.1)
Alkaline phosphatase (APISO): 78 U/L (ref 37–153)
BUN/Creatinine Ratio: 30 (calc) — ABNORMAL HIGH (ref 6–22)
BUN: 28 mg/dL — ABNORMAL HIGH (ref 7–25)
CO2: 28 mmol/L (ref 20–32)
Calcium: 9 mg/dL (ref 8.6–10.4)
Chloride: 103 mmol/L (ref 98–110)
Creat: 0.93 mg/dL (ref 0.50–1.05)
Globulin: 3.5 g/dL (ref 1.9–3.7)
Glucose, Bld: 117 mg/dL — ABNORMAL HIGH (ref 65–99)
Potassium: 4.2 mmol/L (ref 3.5–5.3)
Sodium: 139 mmol/L (ref 135–146)
Total Bilirubin: 0.4 mg/dL (ref 0.2–1.2)
Total Protein: 7.5 g/dL (ref 6.1–8.1)
eGFR: 68 mL/min/{1.73_m2} (ref >=60)

## 2023-07-19 LAB — SEDIMENTATION RATE: Sed Rate: 53 mm/h — ABNORMAL HIGH (ref 0–30)

## 2023-07-19 LAB — CBC WITH DIFFERENTIAL/PLATELET
Absolute Lymphocytes: 1957 {cells}/uL (ref 850–3900)
Absolute Monocytes: 703 {cells}/uL (ref 200–950)
Basophils Absolute: 48 {cells}/uL (ref 0–200)
Basophils Relative: 0.5 %
Eosinophils Absolute: 143 {cells}/uL (ref 15–500)
Eosinophils Relative: 1.5 %
HCT: 40.3 % (ref 35.0–45.0)
Hemoglobin: 13.2 g/dL (ref 11.7–15.5)
MCH: 29.5 pg (ref 27.0–33.0)
MCHC: 32.8 g/dL (ref 32.0–36.0)
MCV: 90 fL (ref 80.0–100.0)
MPV: 10.9 fL (ref 7.5–12.5)
Monocytes Relative: 7.4 %
Neutro Abs: 6650 {cells}/uL (ref 1500–7800)
Neutrophils Relative %: 70 %
Platelets: 209 10*3/uL (ref 140–400)
RBC: 4.48 10*6/uL (ref 3.80–5.10)
RDW: 14.3 % (ref 11.0–15.0)
Total Lymphocyte: 20.6 %
WBC: 9.5 10*3/uL (ref 3.8–10.8)

## 2023-07-19 LAB — PROTEIN / CREATININE RATIO, URINE
Creatinine, Urine: 33 mg/dL (ref 20–275)
Total Protein, Urine: <4 mg/dL — ABNORMAL LOW (ref 5–24)

## 2023-07-19 LAB — C3 AND C4
C3 Complement: 162 mg/dL (ref 83–193)
C4 Complement: 18 mg/dL (ref 15–57)

## 2023-07-19 LAB — ANTI-DNA ANTIBODY, DOUBLE-STRANDED: ds DNA Ab: <1 [IU]/mL

## 2023-07-19 NOTE — Progress Notes (Signed)
dsDNA is negative

## 2023-07-19 NOTE — Progress Notes (Signed)
No proteinuria  Labs are not consistent with a flare.

## 2023-10-31 LAB — HM DIABETES EYE EXAM

## 2023-12-10 NOTE — Progress Notes (Deleted)
 Office Visit Note  Patient: Gloria Porter             Date of Birth: [birth year]           MRN: [id number]             PCP: Neysa Bares (Inactive) Referring: No ref. provider found Visit Date: 12/24/2023 Occupation: @GUAROCC @  Subjective:  No chief complaint on file.   History of Present Illness: Gloria Porter is a 68 y.o. female ***     Activities of Daily Living:  Patient reports morning stiffness for *** {minute/hour:19697}.   Patient {ACTIONS;DENIES/REPORTS:21021675::"Denies"} nocturnal pain.  Difficulty dressing/grooming: {ACTIONS;DENIES/REPORTS:21021675::"Denies"} Difficulty climbing stairs: {ACTIONS;DENIES/REPORTS:21021675::"Denies"} Difficulty getting out of chair: {ACTIONS;DENIES/REPORTS:21021675::"Denies"} Difficulty using hands for taps, buttons, cutlery, and/or writing: {ACTIONS;DENIES/REPORTS:21021675::"Denies"}  No Rheumatology ROS completed.   PMFS History:  Patient Active Problem List   Diagnosis Date Noted  . History of lumbar surgery 09/16/2019  . Pseudoarthrosis of lumbar spine 03/02/2019  . Primary osteoarthritis of both hands 04/10/2017  . Primary osteoarthritis of both knees 04/10/2017  . DDD (degenerative disc disease), lumbar 04/10/2017  . Mixed connective tissue disease (HCC) 04/10/2017  . S/P trigger finger release bilateral third 03/14/2017  . MGUS (monoclonal gammopathy of unknown significance) 03/11/2017  . History of chronic kidney disease 03/11/2017  . Monoclonal paraproteinemia 01/11/2016  . Restless leg 01/11/2015  . Carpal tunnel syndrome 12/28/2014  . Dysphagia, oropharyngeal 12/28/2014  . Anxiety, generalized 10/14/2014  . Generalized joint pain 08/17/2014  . Encounter for therapeutic drug monitoring 07/13/2014  . Failed back syndrome of lumbar spine 07/13/2014  . Encounter for therapeutic drug level monitoring 07/13/2014  . ACTH excess, central (HCC) 06/16/2014  . Diabetes (HCC) 06/16/2014  . Fibrositis 06/16/2014   . Cannot sleep 06/16/2014  . Extreme obesity 06/16/2014  . Neuropathy 06/16/2014  . Allergic rhinitis, seasonal 06/16/2014  . Post menopausal syndrome 06/16/2014  . Sjogren's syndrome (HCC) 06/16/2014  . Spinal stenosis 06/16/2014  . Adult hypothyroidism 06/16/2014  . 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 Diabetes mellitus (HCC) 06/16/2014  . Fibromyalgia 06/16/2014  . Morbid obesity (HCC) 06/16/2014  . Sjogren's syndrome (HCC) 06/16/2014  . Postmenopausal HRT (hormone replacement therapy) 06/16/2014  . Breath shortness 01/21/2014  . Essential (primary) hypertension 01/21/2014  . Acid reflux 01/21/2014  . Adiposity 01/21/2014  . Depression with anxiety 08/26/2013  . OSA (obstructive sleep apnea) 01/17/2013  . Cushing's syndrome (HCC) 05/23/2011  . Pituitary adenoma (HCC) 05/23/2011    Past Medical History:  Diagnosis Date  . Anxiety   . Arthritis   . Cushing's disease (HCC)    pituitary tumor removed 2012  . DDD (degenerative disc disease), lumbar   . Diabetes mellitus    Endocrinologist Dr. Katina Parlor  . Difficult intubation    2011 scratched trachea  . Dyspnea    upon exertion  . Fibromyalgia   . Gastroparesis   . GERD (gastroesophageal reflux disease)   . Headache(784.0)   . Hypertension   . Hypothyroidism   . Lumbar disc disease   . MGUS (monoclonal gammopathy of unknown significance)   . Mixed connective tissue disease (HCC)   . Neuropathy   . Pneumonia   . Sjogren's disease (HCC)   . Sleep apnea    no cpap.sleep study 2006 [facility]    Family History  Problem Relation Age of Onset  . Diabetes type II Mother   . Diabetes type II Father   . Heart disease Father   . Heart attack Father   .  Colon cancer Maternal Grandmother   . Heart disease Maternal Grandfather    Past Surgical History:  Procedure Laterality Date  . APPENDECTOMY    . BACK SURGERY     lumbar fusion  . BIOPSY  01/15/2020   Procedure: BIOPSY;  Surgeon: Alvis Jourdain,  MD;  Location: [facility];  Service: Endoscopy;;  . BRAIN SURGERY     For cushings  . CARPAL TUNNEL RELEASE Bilateral   . CATARACT EXTRACTION, BILATERAL    . CESAREAN SECTION  10/31/1973  . COLONOSCOPY    . COLONOSCOPY WITH PROPOFOL  N/A 01/15/2020   Procedure: COLONOSCOPY WITH PROPOFOL ;  Surgeon: Alvis Jourdain, MD;  Location: [facility];  Service: Endoscopy;  Laterality: N/A;  . COMBINED HYSTEROSCOPY DIAGNOSTIC / D&C    . ENDOMETRIAL ABLATION    . ESOPHAGOGASTRODUODENOSCOPY (EGD) WITH PROPOFOL  N/A 01/15/2020   Procedure: ESOPHAGOGASTRODUODENOSCOPY (EGD) WITH PROPOFOL ;  Surgeon: Alvis Jourdain, MD;  Location: [facility];  Service: Endoscopy;  Laterality: N/A;  . ESOPHAGOGASTRODUODENOSCOPY ENDOSCOPY    . FRACTURE SURGERY     l arm  . LUMBAR LAMINECTOMY/DECOMPRESSION MICRODISCECTOMY  11/13/2011   Procedure: LUMBAR LAMINECTOMY/DECOMPRESSION MICRODISCECTOMY;  Surgeon: Augustine Blocker, MD;  Location: [facility];  Service: Neurosurgery;  Laterality: Bilateral;  Bilateral Lumbar four-five Decompression  . POLYPECTOMY  01/15/2020   Procedure: POLYPECTOMY;  Surgeon: Alvis Jourdain, MD;  Location: [facility];  Service: Endoscopy;;  . TONSILLECTOMY    . TRANSPHENOIDAL / TRANSNASAL HYPOPHYSECTOMY / RESECTION PITUITARY TUMOR    . TRIGGER FINGER RELEASE    . TUBAL LIGATION     Social History   Social History Narrative  . Not on file   Immunization History  Administered Date(s) Administered  . Influenza Inj Mdck Quad Pf 07/12/2017, 07/14/2018  . Influenza Split 06/13/2017  . Moderna Covid-19 Fall Seasonal Vaccine 32yrs & older 06/19/2022  . Moderna Covid-19 Vaccine Bivalent Booster 51yrs & up 06/05/2021  . Moderna Sars-Covid-2 Vaccination 11/03/2019, 12/01/2019, 06/16/2020, 12/16/2020  . Pneumococcal Conjugate-13 12/28/2014  . Pneumococcal Polysaccharide-23 01/03/2017     Objective: Vital Signs: LMP 11/08/2006    Physical Exam   Musculoskeletal Exam: ***  CDAI Exam: CDAI Score:  -- Patient Global: --; Provider Global: -- Swollen: --; Tender: -- Joint Exam 12/24/2023   No joint exam has been documented for this visit   There is currently no information documented on the homunculus. Go to the Rheumatology activity and complete the homunculus joint exam.  Investigation: No additional findings.  Imaging: No results found.  Recent Labs: Lab Results  Component Value Date   WBC 9.5 07/17/2023   HGB 13.2 07/17/2023   PLT 209 07/17/2023   NA 139 07/17/2023   K 4.2 07/17/2023   CL 103 07/17/2023   CO2 28 07/17/2023   GLUCOSE 117 (H) 07/17/2023   BUN 28 (H) 07/17/2023   CREATININE 0.93 07/17/2023   BILITOT 0.4 07/17/2023   ALKPHOS 65 05/20/2019   AST 18 07/17/2023   ALT 15 07/17/2023   PROT 7.5 07/17/2023   ALBUMIN  3.7 05/20/2019   CALCIUM 9.0 07/17/2023   GFRAA 111 01/05/2021    Speciality Comments: PLQ eye exam: 01/21/2023 [facility] [place] Follow up in 1 year  Procedures:  No procedures performed Allergies: Codeine, Methotrexate , Atorvastatin, and Quinapril hcl   Assessment / Plan:     Visit Diagnoses: Mixed connective tissue disease (HCC)  High risk medication use  Primary osteoarthritis of both hands  Primary osteoarthritis of both knees  Sicca complex (HCC)  Trochanteric bursitis of both hips  Degeneration of intervertebral disc of lumbar region without discogenic back pain or lower extremity pain  Fibromyalgia  Other fatigue  MGUS (monoclonal gammopathy of unknown significance)  History of diabetes mellitus  History of hypothyroidism  History of anxiety  History of cholecystectomy  History of depression  History of Cushing disease  OSA (obstructive sleep apnea)  Orders: No orders of the defined types were placed in this encounter.  No orders of the defined types were placed in this encounter.   Face-to-face time spent with patient was *** minutes. Greater than 50% of time was spent in counseling and  coordination of care.  Follow-Up Instructions: No follow-ups on file.   Romayne Clubs, PA-C  Note - This record has been created using Dragon software.  Chart creation errors have been sought, but may not always  have been located. Such creation errors do not reflect on  the standard of medical care.

## 2023-12-12 ENCOUNTER — Telehealth: Payer: Self-pay | Admitting: Neurology

## 2023-12-12 NOTE — Telephone Encounter (Signed)
 Pt called to reschedule appointment due to scheduling conflict. But decided to keep the appointment

## 2023-12-15 NOTE — Progress Notes (Unsigned)
 Office Visit Note  Patient: Gloria Porter             Date of Birth: [birth year]           MRN: [id number]             PCP: Neysa Bares (Inactive) Referring: No ref. provider found Visit Date: 12/16/2023 Occupation: @GUAROCC @  Subjective:  Pain in both hands   History of Present Illness: Gloria Porter is a 68 y.o. female with mixed connective tissue disease.  She states couple of weeks ago she had a flare of arthritis with increased pain and swelling in her both hands.  She states she was having difficulty making a fist and holding objects.  None of the other joints are painful.  She states her symptoms gradually eased off.  She has been taking Plaquenil  200 mg p.o. twice daily without any interruption.  She states she has been having dry eyes and had a blocked lacrimal duct in the left eye.  She was recently seen by an ophthalmologist at [facility].  She has surgery coming up.  She continues to have pain and discomfort in her bilateral trochanteric bursa region.  She has some discomfort in her knee joints.  She was also evaluated by an orthopedic surgeon.  She also has some discomfort in her ankles.  She continues to have dry mouth and dry eyes. Patient complains of some shortness of breath.  She was evaluated by Dr. Marcheta Seta in 2019.  At the time she had high-resolution CT which was negative for ILD.  She also had an echocardiogram.    Activities of Daily Living:  Patient reports morning stiffness for 1 hour.   Patient Denies nocturnal pain.  Difficulty dressing/grooming: Reports Difficulty climbing stairs: Reports Difficulty getting out of chair: Reports Difficulty using hands for taps, buttons, cutlery, and/or writing: Reports  Review of Systems  Constitutional:  Positive for fatigue.  HENT:  Positive for mouth dryness. Negative for mouth sores.   Eyes:  Positive for dryness.  Respiratory:  Positive for shortness of breath.   Cardiovascular:  Negative for chest  pain and palpitations.  Gastrointestinal:  Positive for constipation and diarrhea. Negative for blood in stool.  Endocrine: Positive for increased urination.  Genitourinary:  Negative for involuntary urination.  Musculoskeletal:  Positive for joint pain, gait problem, joint pain, joint swelling, myalgias, muscle weakness, morning stiffness, muscle tenderness and myalgias.  Skin:  Negative for color change, rash, hair loss and sensitivity to sunlight.  Allergic/Immunologic: Negative for susceptible to infections.  Neurological:  Positive for dizziness and headaches.  Hematological:  Negative for swollen glands.  Psychiatric/Behavioral:  Positive for depressed mood and sleep disturbance. The patient is nervous/anxious.     PMFS History:  Patient Active Problem List   Diagnosis Date Noted   History of lumbar surgery 09/16/2019   Pseudoarthrosis of lumbar spine 03/02/2019   Primary osteoarthritis of both hands 04/10/2017   Primary osteoarthritis of both knees 04/10/2017   DDD (degenerative disc disease), lumbar 04/10/2017   Mixed connective tissue disease (HCC) 04/10/2017   S/P trigger finger release bilateral third 03/14/2017   MGUS (monoclonal gammopathy of unknown significance) 03/11/2017   History of chronic kidney disease 03/11/2017   Monoclonal paraproteinemia 01/11/2016   Restless leg 01/11/2015   Carpal tunnel syndrome 12/28/2014   Dysphagia, oropharyngeal 12/28/2014   Anxiety, generalized 10/14/2014   Generalized joint pain 08/17/2014   Encounter for therapeutic drug monitoring 07/13/2014   Failed back syndrome of  lumbar spine 07/13/2014   Encounter for therapeutic drug level monitoring 07/13/2014   ACTH excess, central (HCC) 06/16/2014   Diabetes (HCC) 06/16/2014   Fibrositis 06/16/2014   Cannot sleep 06/16/2014   Extreme obesity 06/16/2014   Neuropathy 06/16/2014   Allergic rhinitis, seasonal 06/16/2014   Post menopausal syndrome 06/16/2014   Sjogren's syndrome (HCC)  06/16/2014   Spinal stenosis 06/16/2014   Adult hypothyroidism 06/16/2014   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HCC) 06/16/2014   Diabetes mellitus (HCC) 06/16/2014   Fibromyalgia 06/16/2014   Morbid obesity (HCC) 06/16/2014   Sjogren's syndrome (HCC) 06/16/2014   Postmenopausal HRT (hormone replacement therapy) 06/16/2014   Breath shortness 01/21/2014   Essential (primary) hypertension 01/21/2014   Acid reflux 01/21/2014   Adiposity 01/21/2014   Depression with anxiety 08/26/2013   OSA (obstructive sleep apnea) 01/17/2013   Cushing's syndrome (HCC) 05/23/2011   Pituitary adenoma (HCC) 05/23/2011    Past Medical History:  Diagnosis Date   Anxiety    Arthritis    Cushing's disease (HCC)    pituitary tumor removed 2012   DDD (degenerative disc disease), lumbar    Diabetes mellitus    Endocrinologist Dr. Katina Parlor   Difficult intubation    2011 scratched trachea   Dyspnea    upon exertion   Fibromyalgia    Gastroparesis    GERD (gastroesophageal reflux disease)    Headache(784.0)    Hypertension    Hypothyroidism    Lumbar disc disease    MGUS (monoclonal gammopathy of unknown significance)    Mixed connective tissue disease (HCC)    Neuropathy    Pneumonia    Sjogren's disease (HCC)    Sleep apnea    no cpap.sleep study 2006 [facility]    Family History  Problem Relation Age of Onset   Diabetes type II Mother    Diabetes type II Father    Heart disease Father    Heart attack Father    Colon cancer Maternal Grandmother    Heart disease Maternal Grandfather    Past Surgical History:  Procedure Laterality Date   APPENDECTOMY     BACK SURGERY     lumbar fusion   BIOPSY  01/15/2020   Procedure: BIOPSY;  Surgeon: Alvis Jourdain, MD;  Location: [facility];  Service: Endoscopy;;   BRAIN SURGERY     For cushings   CARPAL TUNNEL RELEASE Bilateral    CATARACT EXTRACTION, BILATERAL     CESAREAN SECTION  10/31/1973   COLONOSCOPY     COLONOSCOPY WITH PROPOFOL  N/A  01/15/2020   Procedure: COLONOSCOPY WITH PROPOFOL ;  Surgeon: Alvis Jourdain, MD;  Location: [facility];  Service: Endoscopy;  Laterality: N/A;   COMBINED HYSTEROSCOPY DIAGNOSTIC / D&C     ENDOMETRIAL ABLATION     ESOPHAGOGASTRODUODENOSCOPY (EGD) WITH PROPOFOL  N/A 01/15/2020   Procedure: ESOPHAGOGASTRODUODENOSCOPY (EGD) WITH PROPOFOL ;  Surgeon: Alvis Jourdain, MD;  Location: [facility];  Service: Endoscopy;  Laterality: N/A;   ESOPHAGOGASTRODUODENOSCOPY ENDOSCOPY     FRACTURE SURGERY     l arm   LUMBAR LAMINECTOMY/DECOMPRESSION MICRODISCECTOMY  11/13/2011   Procedure: LUMBAR LAMINECTOMY/DECOMPRESSION MICRODISCECTOMY;  Surgeon: Augustine Blocker, MD;  Location: [facility];  Service: Neurosurgery;  Laterality: Bilateral;  Bilateral Lumbar four-five Decompression   POLYPECTOMY  01/15/2020   Procedure: POLYPECTOMY;  Surgeon: Alvis Jourdain, MD;  Location: [facility];  Service: Endoscopy;;   TONSILLECTOMY     TRANSPHENOIDAL / TRANSNASAL HYPOPHYSECTOMY / RESECTION PITUITARY TUMOR     TRIGGER FINGER RELEASE     TUBAL LIGATION  Social History   Social History Narrative   Not on file   Immunization History  Administered Date(s) Administered   Influenza Inj Mdck Quad Pf 07/12/2017, 07/14/2018   Influenza Split 06/13/2017   Moderna Covid-19 Fall Seasonal Vaccine 21yrs & older 06/19/2022   Moderna Covid-19 Vaccine Bivalent Booster 69yrs & up 06/05/2021   Moderna Sars-Covid-2 Vaccination 11/03/2019, 12/01/2019, 06/16/2020, 12/16/2020   Pneumococcal Conjugate-13 12/28/2014   Pneumococcal Polysaccharide-23 01/03/2017     Objective: Vital Signs: BP 122/61 (BP Location: Right Arm, Patient Position: Sitting, Cuff Size: Normal)   Pulse 67   Resp 17   Ht 5\' 3"  (1.6 m)   Wt 261 lb (118.4 kg)   LMP 11/08/2006   BMI 46.23 kg/m    Physical Exam Vitals and nursing note reviewed.  Constitutional:      Appearance: She is well-developed.  HENT:     Head: Normocephalic and atraumatic.  Eyes:      Conjunctiva/sclera: Conjunctivae normal.  Cardiovascular:     Rate and Rhythm: Normal rate and regular rhythm.     Heart sounds: Normal heart sounds.  Pulmonary:     Effort: Pulmonary effort is normal.     Breath sounds: Normal breath sounds.  Abdominal:     General: Bowel sounds are normal.     Palpations: Abdomen is soft.  Musculoskeletal:     Cervical back: Normal range of motion.  Lymphadenopathy:     Cervical: No cervical adenopathy.  Skin:    General: Skin is warm and dry.     Capillary Refill: Capillary refill takes less than 2 seconds.  Neurological:     Mental Status: She is alert and oriented to person, place, and time.  Psychiatric:        Behavior: Behavior normal.      Musculoskeletal Exam: She had limited range of motion of the cervical spine.  She had painful limited range of motion of the thoracic and lumbar spine.  Thoracic kyphosis was noted.  Shoulders, elbows, wrist joints were in good range of motion.  No synovitis was noted over wrist joints or MCP joints.  Bilateral PIP and DIP thickening was noted.  She had tenderness over bilateral trochanteric bursa.  Hip joints and knee joints with good range of motion.  No warmth swelling or effusion was noted.  There was no synovitis or tenderness over ankles or MTPs.  CDAI Exam: CDAI Score: -- Patient Global: --; Provider Global: -- Swollen: --; Tender: -- Joint Exam 12/16/2023   No joint exam has been documented for this visit   There is currently no information documented on the homunculus. Go to the Rheumatology activity and complete the homunculus joint exam.  Investigation: No additional findings.  Imaging: No results found.  Recent Labs: Lab Results  Component Value Date   WBC 9.5 07/17/2023   HGB 13.2 07/17/2023   PLT 209 07/17/2023   NA 139 07/17/2023   K 4.2 07/17/2023   CL 103 07/17/2023   CO2 28 07/17/2023   GLUCOSE 117 (H) 07/17/2023   BUN 28 (H) 07/17/2023   CREATININE 0.93 07/17/2023    BILITOT 0.4 07/17/2023   ALKPHOS 65 05/20/2019   AST 18 07/17/2023   ALT 15 07/17/2023   PROT 7.5 07/17/2023   ALBUMIN  3.7 05/20/2019   CALCIUM 9.0 07/17/2023   GFRAA 111 01/05/2021    Speciality Comments: PLQ eye exam: 01/21/2023 [facility] [place] Follow up in 1 year  Procedures:  No procedures performed Allergies:  Codeine, Methotrexate , Atorvastatin, and Quinapril hcl   Assessment / Plan:     Visit Diagnoses: Mixed connective tissue disease (HCC) - ANA 1:160 NS, RNP positive, inflammatory arthritis: -Patient gives us  history of recent flare with increased pain and swelling in bilateral hands to the point she could not open the jars and bottles.  No synovitis was noted on the examination today.  Patient states his symptoms gradually improved.  She continues to have pain and discomfort in her hips, knees and her ankles.  No synovitis was noted.  She gives history of fatigue and sicca symptoms.  Labs obtained on July 17, 2023 CBC and CMP were normal.  Sed rate remains elevated.  Plan: Protein / creatinine ratio, urine, Anti-DNA antibody, double-stranded, C3 and C4, Sedimentation rate, ANA, RNP Antibody  High risk medication use - Plaquenil  200 mg 1 tablet by mouth twice daily. PLQ eye exam: 01/21/2023 [facility] -Patient states she had recent evaluation by ophthalmologist at [facility] who recommended to reduce the dose of Plaquenil  to twice daily Monday to Friday.  I advised her to get pack Plaquenil  eye examination through her ophthalmologist and get the form filled.  Patient states she tried reducing the dose of Plaquenil  but did start experiencing increased pain.  Plan: CBC with Differential/Platelet, Comprehensive metabolic panel with GFR.  In [place]  immunization was placed in the AVS.  Sicca complex (HCC) - [place]-: Patient continues to have chronic sicca symptoms.  Per  she has blocked tear ducts of the left eye she was evaluated by ophthalmologist and has  surgery coming up to open the blocked tear duct per patient.  She will get Plaquenil  eye examination filled by the ophthalmologist.  Primary osteoarthritis of both hands-she reports recent flare of arthritis with increased pain and swelling in bilateral hands which gradually got better.  No synovitis was noted on the examination.  She had bilateral PIP and DIP thickening.  Joint protection muscle strengthening was discussed.  A handout on hand exercises was given.  Trochanteric bursitis of both hips-she complains of pain and tenderness over bilateral trochanteric bursa.  IT band stretches were discussed and a handout was given.  Primary osteoarthritis of both knee she complains of discomfort in the bilateral knees.  No warmth swelling or effusion was noted.  Degeneration of intervertebral disc of lumbar region without discogenic back pain or lower extremity pain - Status post fusion.  Patient reports chronic discomfort in her lower back.  Shortness of breath-she reports increased shortness of breath on exertion.  She was evaluated by Dr. Bertrum Brodie in 2019 at that time high-resolution CT was normal and echocardiogram was normal.  I will refer patient again to Dr. Bertrum Brodie for evaluation.  Fibromyalgia-she continues to have generalized pain and discomfort from fibromyalgia.  She had hyperalgesia and positive tender points.  Need for regular exercise and stretching was discussed.  Other fatigue-related to abnormality.  Other medical problems are listed as follows:  History of Cushing disease  History of diabetes mellitus  History of chronic kidney disease  History of hypothyroidism  MGUS (monoclonal gammopathy of unknown significance) - Followed by oncology.  History of anxiety  History of depression  OSA (obstructive sleep apnea)  Orders: Orders Placed This Encounter  Procedures   Protein / creatinine ratio, urine   CBC with Differential/Platelet   Comprehensive metabolic panel  with GFR   Anti-DNA antibody, double-stranded   C3 and C4   Sedimentation rate   ANA   RNP Antibody  No orders of the defined types were placed in this encounter.    Follow-Up Instructions: Return in about 5 months (around 05/17/2024) for MCTD.   Nicholas Bari, MD  Note - This record has been created using Animal nutritionist.  Chart creation errors have been sought, but may not always  have been located. Such creation errors do not reflect on  the standard of medical care.

## 2023-12-16 ENCOUNTER — Encounter: Payer: Self-pay | Admitting: Rheumatology

## 2023-12-16 ENCOUNTER — Ambulatory Visit: Attending: Rheumatology | Admitting: Rheumatology

## 2023-12-16 ENCOUNTER — Telehealth: Payer: Self-pay | Admitting: *Deleted

## 2023-12-16 VITALS — BP 122/61 | HR 67 | Resp 17 | Ht 63.0 in | Wt 261.0 lb

## 2023-12-16 DIAGNOSIS — Z87448 Personal history of other diseases of urinary system: Secondary | ICD-10-CM | POA: Diagnosis present

## 2023-12-16 DIAGNOSIS — M797 Fibromyalgia: Secondary | ICD-10-CM | POA: Diagnosis present

## 2023-12-16 DIAGNOSIS — M170 Bilateral primary osteoarthritis of knee: Secondary | ICD-10-CM

## 2023-12-16 DIAGNOSIS — M19041 Primary osteoarthritis, right hand: Secondary | ICD-10-CM

## 2023-12-16 DIAGNOSIS — D472 Monoclonal gammopathy: Secondary | ICD-10-CM

## 2023-12-16 DIAGNOSIS — Z79899 Other long term (current) drug therapy: Secondary | ICD-10-CM

## 2023-12-16 DIAGNOSIS — Z8659 Personal history of other mental and behavioral disorders: Secondary | ICD-10-CM

## 2023-12-16 DIAGNOSIS — Z8639 Personal history of other endocrine, nutritional and metabolic disease: Secondary | ICD-10-CM

## 2023-12-16 DIAGNOSIS — R5383 Other fatigue: Secondary | ICD-10-CM | POA: Diagnosis present

## 2023-12-16 DIAGNOSIS — M51369 Other intervertebral disc degeneration, lumbar region without mention of lumbar back pain or lower extremity pain: Secondary | ICD-10-CM | POA: Diagnosis present

## 2023-12-16 DIAGNOSIS — R0602 Shortness of breath: Secondary | ICD-10-CM | POA: Diagnosis present

## 2023-12-16 DIAGNOSIS — G4733 Obstructive sleep apnea (adult) (pediatric): Secondary | ICD-10-CM | POA: Diagnosis present

## 2023-12-16 DIAGNOSIS — M7062 Trochanteric bursitis, left hip: Secondary | ICD-10-CM | POA: Insufficient documentation

## 2023-12-16 DIAGNOSIS — M351 Other overlap syndromes: Secondary | ICD-10-CM | POA: Diagnosis present

## 2023-12-16 DIAGNOSIS — M19042 Primary osteoarthritis, left hand: Secondary | ICD-10-CM | POA: Diagnosis present

## 2023-12-16 DIAGNOSIS — M3500 Sicca syndrome, unspecified: Secondary | ICD-10-CM | POA: Diagnosis present

## 2023-12-16 DIAGNOSIS — M7061 Trochanteric bursitis, right hip: Secondary | ICD-10-CM

## 2023-12-16 NOTE — Telephone Encounter (Signed)
Referral placed per Dr. Estanislado Pandy

## 2023-12-16 NOTE — Patient Instructions (Addendum)
 Vaccines You are taking a medication(s) that can suppress your immune system.  The following immunizations are recommended: Flu annually Covid-19  Td/Tdap (tetanus, diphtheria, pertussis) every 10 years Pneumonia (Prevnar 15 then Pneumovax 23 at least 1 year apart.  Alternatively, can take Prevnar 20 without needing additional dose) Shingrix: 2 doses from 4 weeks to 6 months apart  Please check with your PCP to make sure you are up to date.   Hand Exercises Hand exercises can be helpful for almost anyone. They can strengthen your hands and improve flexibility and movement. The exercises can also increase blood flow to the hands. These results can make your work and daily tasks easier for you. Hand exercises can be especially helpful for people who have joint pain from arthritis or nerve damage from using their hands over and over. These exercises can also help people who injure a hand. Exercises Most of these hand exercises are gentle stretching and motion exercises. It is usually safe to do them often throughout the day. Warming up your hands before exercise may help reduce stiffness. You can do this with gentle massage or by placing your hands in warm water for 10-15 minutes. It is normal to feel some stretching, pulling, tightness, or mild discomfort when you begin new exercises. In time, this will improve. Remember to always be careful and stop right away if you feel sudden, very bad pain or your pain gets worse. You want to get better and be safe. Ask your health care provider which exercises are safe for you. Do exercises exactly as told by your provider and adjust them as told. Do not begin these exercises until told by your provider. Knuckle bend or "claw" fist  Stand or sit with your arm, hand, and all five fingers pointed straight up. Make sure to keep your wrist straight. Gently bend your fingers down toward your palm until the tips of your fingers are touching your palm. Keep your  big knuckle straight and only bend the small knuckles in your fingers. Hold this position for 10 seconds. Straighten your fingers back to your starting position. Repeat this exercise 5-10 times with each hand. Full finger fist  Stand or sit with your arm, hand, and all five fingers pointed straight up. Make sure to keep your wrist straight. Gently bend your fingers into your palm until the tips of your fingers are touching the middle of your palm. Hold this position for 10 seconds. Extend your fingers back to your starting position, stretching every joint fully. Repeat this exercise 5-10 times with each hand. Straight fist  Stand or sit with your arm, hand, and all five fingers pointed straight up. Make sure to keep your wrist straight. Gently bend your fingers at the big knuckle, where your fingers meet your hand, and at the middle knuckle. Keep the knuckle at the tips of your fingers straight and try to touch the bottom of your palm. Hold this position for 10 seconds. Extend your fingers back to your starting position, stretching every joint fully. Repeat this exercise 5-10 times with each hand. Tabletop  Stand or sit with your arm, hand, and all five fingers pointed straight up. Make sure to keep your wrist straight. Gently bend your fingers at the big knuckle, where your fingers meet your hand, as far down as you can. Keep the small knuckles in your fingers straight. Think of forming a tabletop with your fingers. Hold this position for 10 seconds. Extend your fingers back to your  starting position, stretching every joint fully. Repeat this exercise 5-10 times with each hand. Finger spread  Place your hand flat on a table with your palm facing down. Make sure your wrist stays straight. Spread your fingers and thumb apart from each other as far as you can until you feel a gentle stretch. Hold this position for 10 seconds. Bring your fingers and thumb tight together again. Hold this  position for 10 seconds. Repeat this exercise 5-10 times with each hand. Making circles  Stand or sit with your arm, hand, and all five fingers pointed straight up. Make sure to keep your wrist straight. Make a circle by touching the tip of your thumb to the tip of your index finger. Hold for 10 seconds. Then open your hand wide. Repeat this motion with your thumb and each of your fingers. Repeat this exercise 5-10 times with each hand. Thumb motion  Sit with your forearm resting on a table and your wrist straight. Your thumb should be facing up toward the ceiling. Keep your fingers relaxed as you move your thumb. Lift your thumb up as high as you can toward the ceiling. Hold for 10 seconds. Bend your thumb across your palm as far as you can, reaching the tip of your thumb for the small finger (pinkie) side of your palm. Hold for 10 seconds. Repeat this exercise 5-10 times with each hand. Grip strengthening  Hold a stress ball or other soft ball in the middle of your hand. Slowly increase the pressure, squeezing the ball as much as you can without causing pain. Think of bringing the tips of your fingers into the middle of your palm. All of your finger joints should bend when doing this exercise. Hold your squeeze for 10 seconds, then relax. Repeat this exercise 5-10 times with each hand. Contact a health care provider if: Your hand pain or discomfort gets much worse when you do an exercise. Your hand pain or discomfort does not improve within 2 hours after you exercise. If you have either of these problems, stop doing these exercises right away. Do not do them again unless your provider says that you can. Get help right away if: You develop sudden, severe hand pain or swelling. If this happens, stop doing these exercises right away. Do not do them again unless your provider says that you can. This information is not intended to replace advice given to you by your health care provider. Make  sure you discuss any questions you have with your health care provider. Document Revised: 08/14/2022 Document Reviewed: 08/14/2022 Else[employer] Patient Education  2024 [employer]  Iliotibial Band Syndrome Rehab Ask your health care provider which exercises are safe for you. Do exercises exactly as told by your provider and adjust them as told. It's normal to feel mild stretching, pulling, tightness, or discomfort as you do these exercises. Stop right away if you feel sudden pain or your pain gets a lot worse. Do not begin these exercises until told by your provider. Stretching and range-of-motion exercises These exercises warm up your muscles and joints. They also improve the movement and flexibility of your hip and pelvis. Quadriceps stretch, prone  Lie face down (prone) on a firm surface like a bed or padded floor. Bend your left / right knee. Reach back to hold your ankle or pant leg. If you can't reach your ankle or pant leg, use a belt looped around your foot and grab the belt instead. Gently pull your heel toward  your butt. Your knee should not slide out to the side. You should feel a stretch in the front of your thigh and knee, also called the quadriceps. Hold this position for __________ seconds. Repeat __________ times. Complete this exercise __________ times a day. Iliotibial band stretch The iliotibial band is a strip of tissue that runs along the outside of your hip down to your knee. Lie on your side with your left / right leg on top. Bend both knees and grab your left / right ankle. Stretch out your bottom arm to help you balance. Slowly bring your top knee back so your thigh goes behind your back. Slowly lower your top leg toward the floor until you feel a gentle stretch on the outside of your left / right hip and thigh. If you don't feel a stretch and your knee won't go farther, place the heel of your other foot on top of your knee and pull your knee down toward the floor with  your foot. Hold this position for __________ seconds. Repeat __________ times. Complete this exercise __________ times a day. Strengthening exercises These exercises build strength and endurance in your hip and pelvis. Endurance means your muscles can keep working even when they're tired. Straight leg raises, side-lying This exercise strengthens the muscles that rotate the leg at the hip and move it away from your body. These muscles are called hip abductors. Lie on your side with your left / right leg on top. Lie so your head, shoulder, hip, and knee line up. You can bend your bottom knee to help you balance. Roll your hips slightly forward so they're stacked directly over each other. Your left / right knee should face forward. Tense the muscles in your outer thigh and hip. Lift your top leg 4-6 inches (10-15 cm) off the ground. Hold this position for __________ seconds. Slowly lower your leg back down to the starting position. Let your muscles fully relax before doing this exercise again. Repeat __________ times. Complete this exercise __________ times a day. Leg raises, prone This exercise strengthens the muscles that move the hips backward. These muscles are called hip extensors. Lie face down (prone) on your bed or a firm surface. You can put a pillow under your hips for comfort and to support your lower back. Bend your left / right knee so your foot points straight up toward the ceiling. Keep the other leg straight and behind you. Squeeze your butt muscles. Lift your left / right thigh off the firm surface. Do not let your back arch. Tense your thigh muscle as hard as you can without having more knee pain. Hold this position for __________ seconds. Slowly lower your leg to the starting position. Allow your leg to relax all the way. Repeat __________ times. Complete this exercise __________ times a day. Hip hike  Stand sideways on a bottom step. Place your feet so that your left /  right leg is on the step, and the other foot is hanging off the side. If you need support for balance, hold onto a railing or wall. Keep your knees straight and your abdomen square, meaning your hips are level. Then, lift your left / right hip up toward the ceiling. Slowly let your leg that's hanging off the step lower towards the floor. Your foot should get closer to the ground. Do not lean or bend your knees during this movement. Repeat __________ times. Complete this exercise __________ times a day. This information is not intended to  replace advice given to you by your health care provider. Make sure you discuss any questions you have with your health care provider. Document Revised: 10/12/2022 Document Reviewed: 10/12/2022 Else[employer] Patient Education  2024 [employer]  Exercises for Chronic Knee Pain Chronic knee pain is pain that lasts longer than 3 months. For most people with chronic knee pain, exercise and weight loss is an important part of treatment. Your health care provider may want you to focus on: Making the muscles that support your knee stronger. This can take pressure off your knee and reduce pain. Preventing knee stiffness. How far you can move your knee, keeping it there or making it farther. Losing weight (if this applies) to take pressure off your knee, lower your risk for injury, and make it easier for you to exercise. Your provider will help you make an exercise program that fits your needs and physical abilities. Below are simple, low-impact exercises you can do at home. Ask your provider or physical therapist how often you should do your exercise program and how many times to repeat each exercise. General safety tips  Get your provider's approval before doing any exercises. Start slowly and stop any time you feel pain. Do not exercise if your knee pain is flaring up. Warm up first. Stretching a cold muscle can cause an injury. Do 5-10 minutes of easy movement or light  stretching before beginning your exercises. Do 5-10 minutes of low-impact activity (like walking or cycling) before starting strengthening exercises. Contact your provider any time you have pain during or after exercising. Exercise can cause discomfort but should not be painful. It is normal to be a little stiff or sore after exercising. Stretching and range-of-motion exercises Front thigh stretch  Stand up straight and support your body by holding on to a chair or resting one hand on a wall. With your legs straight and close together, bend one knee to lift your heel up toward your butt. Using one hand for support, grab your ankle with your free hand. Pull your foot up closer toward your butt to feel the stretch in front of your thigh. Hold the stretch for 30 seconds. Repeat __________ times. Complete this exercise __________ times a day. Back thigh stretch  Sit on the floor with your back straight and your legs out straight in front of you. Place the palms of your hands on the floor and slide them toward your feet as you bend at the hip. Try to touch your nose to your knees and feel the stretch in the back of your thighs. Hold for 30 seconds. Repeat __________ times. Complete this exercise __________ times a day. Calf stretch  Stand facing a wall. Place the palms of your hands flat against the wall, arms extended, and lean slightly against the wall. Get into a lunge position with one leg bent at the knee and the other leg stretched out straight behind you. Keep both feet facing the wall and increase the bend in your knee while keeping the heel of the other leg flat on the ground. You should feel the stretch in your calf. Hold for 30 seconds. Repeat __________ times. Complete this exercise __________ times a day. Strengthening exercises Straight leg lift  Lie on your back with one knee bent and the other leg out straight. Slowly lift the straight leg without bending the knee. Lift  until your foot is about 12 inches (30 cm) off the floor. Hold for 3-5 seconds and slowly lower your leg.  Repeat __________ times. Complete this exercise __________ times a day. Single leg dip  Stand between two chairs and put both hands on the backs of the chairs for support. Extend one leg out straight with your body weight resting on the heel of the standing leg. Slowly bend your standing knee to dip your body to the level that is comfortable for you. Hold for 3-5 seconds. Repeat __________ times. Complete this exercise __________ times a day. Hamstring curls  Stand straight, knees close together, facing the back of a chair. Hold on to the back of a chair with both hands. Keep one leg straight. Bend the other knee while bringing the heel up toward the butt until the knee is bent at a 90-degree angle (right angle). Hold for 3-5 seconds. Repeat __________ times. Complete this exercise __________ times a day. Wall squat  Stand straight with your back, hips, and head against a wall. Step forward one foot at a time with your back still against the wall. Your feet should be 2 feet (61 cm) from the wall at shoulder width. Keeping your back, hips, and head against the wall, slide down the wall to as close to a sitting position as you can get. Hold for 5-10 seconds, then slowly slide back up. Repeat __________ times. Complete this exercise __________ times a day. Step-ups  Stand in front of a sturdy platform or stool that is about 6 inches (15 cm) high. Slowly step up with your left / right foot, keeping your knee in line with your hip and foot. Do not let your knee bend so far that you cannot see your toes. Hold on to a chair for balance, but do not use it for support. Slowly unlock your knee and lower yourself to the starting position. Repeat __________ times. Complete this exercise __________ times a day. Contact a health care provider if: Your exercises cause pain. Your pain is worse  after you exercise. Your pain prevents you from doing your exercises. This information is not intended to replace advice given to you by your health care provider. Make sure you discuss any questions you have with your health care provider. Document Revised: 08/14/2022 Document Reviewed: 08/14/2022 Elsevier Patient Education  2024 [employer]  Osteoarthritis  Osteoarthritis is a type of arthritis. It refers to joint pain or joint disease. Osteoarthritis affects tissue that covers the ends of bones in joints (cartilage). Cartilage acts as a cushion between the bones and helps them move smoothly. Osteoarthritis occurs when cartilage in the joints gets worn down. Osteoarthritis is sometimes called "wear and tear" arthritis. Osteoarthritis is the most common form of arthritis. It often occurs in older people. It is a condition that gets worse over time. The joints most often affected by this condition are in the fingers, toes, hips, knees, and spine, including the neck and lower back. What are the causes? This condition is caused by the wearing down of cartilage that covers the ends of bones. What increases the risk? The following factors may make you more likely to develop this condition: Being age 64 or older. Obesity. Overuse of joints. Past injury of a joint. Past surgery on a joint. Family history of osteoarthritis. What are the signs or symptoms? The main symptoms of this condition are pain, swelling, and stiffness in the joint. Other symptoms may include: An enlarged joint. More pain and further damage caused by small pieces of bone or cartilage that break off and float inside of the joint. Small deposits  of bone (osteophytes) that grow on the edges of the joint. A grating or scraping feeling inside the joint when you move it. Popping or creaking sounds when you move. Difficulty walking or exercising. An inability to grip items, twist your hand, or control the movements of your  hands and fingers. How is this diagnosed? This condition may be diagnosed based on: Your medical history. A physical exam. Your symptoms. X-rays of the affected joints. Blood tests to rule out other types of arthritis. How is this treated? There is no cure for this condition, but treatment can help control pain and improve joint function. Treatment may include a combination of therapies, such as: Pain relief techniques, such as: Applying heat and cold to the joint. Massage. A form of talk therapy called cognitive behavioral therapy (CBT). This therapy helps you set goals and follow up on the changes that you make. Medicines for pain and inflammation. The medicines can be taken by mouth or applied to the skin. They include: NSAIDs, such as ibuprofen . Prescription medicines. Strong anti-inflammatory medicines (corticosteroids). Certain nutritional supplements. A prescribed exercise program. You may work with a physical therapist. Assistive devices, such as a brace, wrap, splint, specialized glove, or cane. A weight control plan. Surgery, such as: An osteotomy. This is done to reposition the bones and relieve pain or to remove loose pieces of bone and cartilage. Joint replacement surgery. You may need this surgery if you have advanced osteoarthritis. Follow these instructions at home: Activity Rest your affected joints as told by your health care provider. Exercise as told by your provider. The provider may recommend specific types of exercise, such as: Strengthening exercises. These are done to strengthen the muscles that support joints affected by arthritis. Aerobic activities. These are exercises, such as brisk walking or water aerobics, that increase your heart rate. Range-of-motion activities. These help your joints move more easily. Balance and agility exercises. Managing pain, stiffness, and swelling     If told, apply heat to the affected area as often as told by your  provider. Use the heat source that your provider recommends, such as a moist heat pack or a heating pad. If you have a removable assistive device, remove it as told by your provider. Place a towel between your skin and the heat source. If your provider tells you to keep the assistive device on while you apply heat, place a towel between the assistive device and the heat source. Leave the heat on for 20-30 minutes. If told, put ice on the affected area. If you have a removable assistive device, remove it as told by your provider. Put ice in a plastic bag. Place a towel between your skin and the bag. If your provider tells you to keep the assistive device on during icing, place a towel between the assistive device and the bag. Leave the ice on for 20 minutes, 2-3 times a day. If your skin turns bright red, remove the ice or heat right away to prevent skin damage. The risk of damage is higher if you cannot feel pain, heat, or cold. Move your fingers or toes often to reduce stiffness and swelling. Raise (elevate) the affected area above the level of your heart while you are sitting or lying down. General instructions Take over-the-counter and prescription medicines only as told by your provider. Maintain a healthy weight. Follow instructions from your provider for weight control. Do not use any products that contain nicotine or tobacco. These products include cigarettes, chewing tobacco, and  vaping devices, such as e-cigarettes. If you need help quitting, ask your provider. Use assistive devices as told by your provider. Where to find more information [employer] of Arthritis and Musculoskeletal and Skin Diseases: niams.http://www.myers.net/ [employer] on Aging: BaseRingTones.pl American College of Rheumatology: rheumatology.org Contact a health care provider if: You have redness, swelling, or a feeling of warmth in a joint that gets worse. You have a fever along with joint or muscle aches. You  develop a rash. You have trouble doing your normal activities. You have pain that gets worse and is not relieved by pain medicine. This information is not intended to replace advice given to you by your health care provider. Make sure you discuss any questions you have with your health care provider. Document Revised: 03/29/2022 Document Reviewed: 03/29/2022 Elsevier Patient Education  2024 [employer].

## 2023-12-17 NOTE — Progress Notes (Signed)
 Urine protein creatinine ratio normal, glucose mildly elevated, probably not a fasting sample, sed rate is elevated at 82.  CBC normal, anti-DNA negative RNP negative, complements normal, ANA pending.  Elevated sedimentation rate indicates recent flare.  Patient's symptoms were improving at the last visit.  If she notices another flare then she should notify us  to modify treatment.

## 2023-12-18 ENCOUNTER — Ambulatory Visit (INDEPENDENT_AMBULATORY_CARE_PROVIDER_SITE_OTHER): Payer: Medicare Other | Admitting: Neurology

## 2023-12-18 ENCOUNTER — Encounter: Payer: Self-pay | Admitting: Neurology

## 2023-12-18 VITALS — BP 146/52 | HR 70 | Ht 63.0 in | Wt 262.0 lb

## 2023-12-18 DIAGNOSIS — M351 Other overlap syndromes: Secondary | ICD-10-CM

## 2023-12-18 DIAGNOSIS — D472 Monoclonal gammopathy: Secondary | ICD-10-CM | POA: Diagnosis not present

## 2023-12-18 DIAGNOSIS — G4733 Obstructive sleep apnea (adult) (pediatric): Secondary | ICD-10-CM

## 2023-12-18 DIAGNOSIS — G629 Polyneuropathy, unspecified: Secondary | ICD-10-CM

## 2023-12-18 DIAGNOSIS — G4734 Idiopathic sleep related nonobstructive alveolar hypoventilation: Secondary | ICD-10-CM

## 2023-12-18 MED ORDER — LIDOCAINE 5 % EX OINT: TOPICAL_OINTMENT | CUTANEOUS | 11 refills | Status: AC

## 2023-12-18 MED ORDER — HORIZANT 600 MG PO TBCR: 1.0000 | EXTENDED_RELEASE_TABLET | Freq: Three times a day (TID) | ORAL | 3 refills | Status: AC

## 2023-12-18 NOTE — Progress Notes (Signed)
 [facility]  PATIENT: Gloria Porter DOB: 1956-01-17  REFERRING DOCTOR OR PCP:  Rushie Courier SOURCE: paitent  _________________________________   HISTORICAL  CHIEF COMPLAINT:  Chief Complaint  Patient presents with   Follow-up    Pt in room 10.alone. Here Neuropathy follow up. Pt reports toes are burning, uses lidocaine . Taking Horizant  3 per day.    HISTORY OF PRESENT ILLNESS:  Gloria Porter is a 68 y.o. woman with neuropathy and fibromyalgia.    Update 12/18/2023: She is having more hip/leg pain and has been told she has bursitis.   She did PT and had a joint injection but had little benefit.     Joints also seemed more swollen to her.   She saw Dr. Alvira Josephs and ESR was elevated to 82.   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 The dysesthesias are mostly tolerable though fluctuates.  Pain is worse when she rests/relaxes..  The worst pain is in her feet and bother her most later in the day when inactive..  The lidocaine  ointment and Horizant  1200 mg po qHShelp some.  Cold gel slippers relieve the pain some.  Gait is mildly off balanced but she is rarely using her rollator   She was seeing Dr. Elnita Hai (last visit around 2020).  Last MM 12/16/2022 panel showed normal IgG levels and no M-spike.      Horizant  600 mg po bid and lidocaine  cream have helped some.  She uses special ice pack socks with benefit at night.   Lamotrigine was poorly tolerated.   Trazodone  and imipramine  had not helped.  She also has MCTD and sees Dr. Alvira Josephs.  She is on Plaquenil  but the Methotrexate  was stopped..   She rarely takes hydrocodone .  She had lumbar surgery in July 2022 (L4L5 re-fusion).   Then due to more pain, she saw Dr. Larrie Po for refusion L4L5 and new fusion L5S1.   The left sciatic nerve/radicular issues improved after the second operation    She still has pain when she stands a while.  She was diagnosed with OSA in the past.     However, PSG in 2022 did not show OSA (AHI only 1.1 overall but REM AHI was elevated at 18).   She did have nocturnal hypoxemia and was started on 2L O2 - does not note any improvement in sleep  Polyneuropathy/MGUS history:   She reports a burning dysesthetic pain and RLS.    Etiology is likely related to either IDDM (dx in 1991; IDDM since 2008) or MGUS.    She had an elevated M-spike on SPEP and is seeing Dr. Elnita Hai in [facility].    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    Bone survey was normal.   She has a family history of Multiple Myeloma  Polysomnogram 02/03/2021  There was no significant overall OSA (AHI = 1.1/hr) though she had moderate REM associated OSA (REM OSA = 18/hr) Nocturnal hypoxemia with a mean SaO2% = 91% with 33 minutes below SaO2 89% Moderate periodic limb movements of sleep but negligible impact on sleep (PLMS arousal index only 1.3/hr) Delayed REM latency could be a medicine effect.      REVIEW OF SYSTEMS: Constitutional: No fevers, chills, sweats, or change in appetite.  Notes fatigue and sleepiness Eyes: No visual changes, double vision, eye pain Ear, nose and throat: No hearing loss, ear pain, nasal congestion, sore throat Cardiovascular: No chest pain, palpitations Respiratory:  No shortness of breath  at rest or with exertion.   No wheezes.  Has OSA GastrointestinaI: No nausea, vomiting, diarrhea, abdominal pain, fecal incontinence Genitourinary:  Notes urgency and rare incontiennce Musculoskeletal:  as above Integumentary: No rash, pruritus, skin lesions Neurological: as above Psychiatric: Notes depression and some anxiety Endocrine: No palpitations, diaphoresis, change in appetite, change in weigh or increased thirst Hematologic/Lymphatic:  No anemia, purpura, petechiae. Allergic/Immunologic: No itchy/runny eyes, nasal congestion, recent allergic reactions, rashes  ALLERGIES: Allergies  Allergen Reactions   Codeine Nausea And Vomiting    Other reaction(s): Unknown    Methotrexate  Nausea And Vomiting and Other (See Comments)    Over sedation   Atorvastatin     She cannot remember reaction   Quinapril Hcl Cough    HOME MEDICATIONS:  Current Outpatient Medications:    ACCU-CHEK FASTCLIX LANCETS MISC, , Disp: , Rfl:    B Complex-C (SUPER B COMPLEX PO), Take by mouth., Disp: , Rfl:    betamethasone dipropionate (DIPROLENE) 0.05 % ointment, Apply 1 application topically as needed., Disp: , Rfl:    buPROPion  (WELLBUTRIN  XL) 150 MG 24 hr tablet, Take 300 mg by mouth daily., Disp: , Rfl:    Calcium Carb-Cholecalciferol (CALCIUM 1000 + D PO), Take 1 tablet by mouth daily. , Disp: , Rfl:    Continuous Blood Gluc Sensor (FREESTYLE LIBRE 2 SENSOR) MISC, by Does not apply route., Disp: , Rfl:    diclofenac Sodium (VOLTAREN) 1 % GEL, Apply topically as needed., Disp: , Rfl:    empagliflozin (JARDIANCE) 25 MG TABS tablet, Take 25 mg by mouth daily. , Disp: , Rfl:    furosemide  (LASIX ) 40 MG tablet, Take 40 mg by mouth daily., Disp: , Rfl:    hydroxychloroquine  (PLAQUENIL ) 200 MG tablet, TAKE 1 TABLET TWICE A DAY FOR MIXED CONNECTIVE TISSUE DISEASE, Disp: 180 tablet, Rfl: 0   Insulin  Regular Human (HUMULIN R  IJ), Inject 1 Dose as directed as needed. Sliding scale U 500, Disp: , Rfl:    lidocaine  (XYLOCAINE ) 5 % ointment, APPLY 1 APPLICATION TOPICALLY AS NEEDED TO FEET UP TO 4 TIMES A DAY, Disp: 100 g, Rfl: 11   loratadine  (CLARITIN ) 10 MG tablet, Take 10 mg by mouth daily., Disp: , Rfl:    LORazepam (ATIVAN) 2 MG tablet, Take 4 mg by mouth at bedtime., Disp: , Rfl:    losartan  (COZAAR ) 50 MG tablet, Take 50 mg by mouth daily after supper. , Disp: , Rfl:    montelukast  (SINGULAIR ) 10 MG tablet, Take 10 mg by mouth daily., Disp: , Rfl:    Multiple Vitamins-Minerals (ZINC  PO), Take by mouth daily. With copper, Disp: , Rfl:    PANTOPRAZOLE  SODIUM PO, 40 mg daily., Disp: , Rfl:    ramelteon (ROZEREM) 8 MG tablet, Take 8 mg by mouth at bedtime., Disp: , Rfl:     rosuvastatin (CRESTOR) 5 MG tablet, Take 5 mg by mouth daily., Disp: , Rfl:    Semaglutide  (OZEMPIC , 0.25 OR 0.5 MG/DOSE, [place]), Inject 1 Dose into the skin once a week., Disp: , Rfl:    Acetaminophen  (TYLENOL  PO), Take by mouth. (Patient not taking: Reported on 12/18/2023), Disp: , Rfl:    ARMOUR THYROID  300 MG tablet, 300mg  by mouth 5 days per week and 150mg  by mouth 2 days per week (Patient not taking: Reported on 12/18/2023), Disp: , Rfl:    Aspirin-Acetaminophen -Caffeine (EXCEDRIN PO), Take by mouth. (Patient not taking: Reported on 12/18/2023), Disp: , Rfl:    B Complex Vitamins (VITAMIN B COMPLEX PO), ,  Disp: , Rfl:    celecoxib (CELEBREX) 200 MG capsule, Take 200 mg by mouth 2 (two) times daily. (Patient not taking: Reported on 12/18/2023), Disp: , Rfl:    Cholecalciferol (VITAMIN D -3) 125 MCG (5000 UT) TABS, Take 5,000 Units by mouth daily.  (Patient not taking: Reported on 12/18/2023), Disp: , Rfl:    dimenhyDRINATE  (DRAMAMINE PO), Take 1 Dose by mouth as needed. (Patient not taking: Reported on 12/18/2023), Disp: , Rfl:    diphenhydramine-acetaminophen  (TYLENOL  PM) 25-500 MG TABS tablet, Take 3 tablets by mouth at bedtime. (Patient not taking: Reported on 12/18/2023), Disp: , Rfl:    fluticasone  (FLONASE ) 50 MCG/ACT nasal spray, Place into both nostrils as needed. (Patient not taking: Reported on 12/18/2023), Disp: , Rfl:    FREESTYLE LITE test strip, daily. (Patient not taking: Reported on 12/18/2023), Disp: , Rfl:    HORIZANT  600 MG TBCR, Take 1 tablet (600 mg total) by mouth 3 (three) times daily. (Patient not taking: Reported on 12/18/2023), Disp: 270 tablet, Rfl: 3   Insulin  Syringe-Needle U-100 31G X 5/16" 0.5 ML MISC, USE TO INJECT UP TO THREE TIMES A DAY (Patient not taking: Reported on 12/18/2023), Disp: , Rfl:    MELATONIN PO, Take 20 mg by mouth daily. (Patient not taking: Reported on 12/18/2023), Disp: , Rfl:    Multiple Vitamin (MULTIVITAMIN PO), Take by mouth daily. (Patient not taking: Reported on  12/18/2023), Disp: , Rfl:    Naproxen Sodium (ALEVE PO), Take by mouth. (Patient not taking: Reported on 12/18/2023), Disp: , Rfl:   PAST MEDICAL HISTORY: Past Medical History:  Diagnosis Date   Anxiety    Arthritis    Cushing's disease (HCC)    pituitary tumor removed 2012   DDD (degenerative disc disease), lumbar    Diabetes mellitus    Endocrinologist Dr. Katina Parlor   Difficult intubation    2011 scratched trachea   Dyspnea    upon exertion   Fibromyalgia    Gastroparesis    GERD (gastroesophageal reflux disease)    Headache(784.0)    Hypertension    Hypothyroidism    Lumbar disc disease    MGUS (monoclonal gammopathy of unknown significance)    Mixed connective tissue disease (HCC)    Neuropathy    Pneumonia    Sjogren's disease (HCC)    Sleep apnea    no cpap.sleep study 2006 [facility]    PAST SURGICAL HISTORY: Past Surgical History:  Procedure Laterality Date   APPENDECTOMY     BACK SURGERY     lumbar fusion   BIOPSY  01/15/2020   Procedure: BIOPSY;  Surgeon: Alvis Jourdain, MD;  Location: [facility];  Service: Endoscopy;;   BRAIN SURGERY     For cushings   CARPAL TUNNEL RELEASE Bilateral    CATARACT EXTRACTION, BILATERAL     CESAREAN SECTION  10/31/1973   COLONOSCOPY     COLONOSCOPY WITH PROPOFOL  N/A 01/15/2020   Procedure: COLONOSCOPY WITH PROPOFOL ;  Surgeon: Alvis Jourdain, MD;  Location: [facility];  Service: Endoscopy;  Laterality: N/A;   COMBINED HYSTEROSCOPY DIAGNOSTIC / D&C     ENDOMETRIAL ABLATION     ESOPHAGOGASTRODUODENOSCOPY (EGD) WITH PROPOFOL  N/A 01/15/2020   Procedure: ESOPHAGOGASTRODUODENOSCOPY (EGD) WITH PROPOFOL ;  Surgeon: Alvis Jourdain, MD;  Location: [facility];  Service: Endoscopy;  Laterality: N/A;   ESOPHAGOGASTRODUODENOSCOPY ENDOSCOPY     FRACTURE SURGERY     l arm   LUMBAR LAMINECTOMY/DECOMPRESSION MICRODISCECTOMY  11/13/2011   Procedure: LUMBAR LAMINECTOMY/DECOMPRESSION MICRODISCECTOMY;  Surgeon: Augustine Blocker, MD;  Location: [facility]  NEURO [facility];  Service: Neurosurgery;  Laterality: Bilateral;  Bilateral Lumbar four-five Decompression   POLYPECTOMY  01/15/2020   Procedure: POLYPECTOMY;  Surgeon: Alvis Jourdain, MD;  Location: [facility];  Service: Endoscopy;;   TONSILLECTOMY     TRANSPHENOIDAL / TRANSNASAL HYPOPHYSECTOMY / RESECTION PITUITARY TUMOR     TRIGGER FINGER RELEASE     TUBAL LIGATION      FAMILY HISTORY: Family History  Problem Relation Age of Onset   Diabetes type II Mother    Diabetes type II Father    Heart disease Father    Heart attack Father    Colon cancer Maternal Grandmother    Heart disease Maternal Grandfather     SOCIAL HISTORY:  Social History   Socioeconomic History   Marital status: Married    Spouse name: Not on file   Number of children: Not on file   Years of education: Not on file   Highest education level: Not on file  Occupational History   Not on file  Tobacco Use   Smoking status: Former    Current packs/day: 0.00    Average packs/day: 1 pack/day for 32.0 years (32.0 ttl pk-yrs)    Types: Cigarettes    Start date: 07/27/1974    Quit date: 07/27/2006    Years since quitting: 17.4    Passive exposure: Past   Smokeless tobacco: Never  Vaping Use   Vaping status: Never Used  Substance and Sexual Activity   Alcohol use: Not Currently   Drug use: No   Sexual activity: Not on file  Other Topics Concern   Not on file  Social History Narrative   Not on file   Social Drivers of Health   Financial Resource Strain: Low Risk  (03/08/2022)   [facility]13[facility] prior to 10/13/2022.   Overall Financial Resource Strain (CARDIA)    Difficulty of Paying Living Expenses: Not hard at all  Food Insecurity: Low Risk  (12/12/2023)   [facility]   Hunger Vital Sign    Worried About Running Out of Food in the Last Year: Never true    Ran Out of  Food in the Last Year: Never true  Transportation Needs: No Transportation Needs (12/12/2023)   Received from [facility]    In the past 12 months, has lack of reliable transportation kept you from medical appointments, meetings, work or from getting things needed for daily living? : No  Physical Activity: Inactive (03/08/2022)   [facility], [facility] prior to 10/13/2022., [facility] visits prior to 10/13/2022.   Exercise Vital Sign    Days of Exercise per Week: 0 days    Minutes of Exercise per Session: 0 min  Stress: Stress Concern Present (03/08/2022)   [facility], [facility] prior to 10/13/2022., [facility] visits prior to 10/13/2022.   [employer] of Occupational Health - Occupational Stress Questionnaire    Feeling of Stress : To some extent  Social Connections: Socially Isolated (03/08/2022)   [facility], [facility] prior to 10/13/2022., [facility] visits prior to 10/13/2022.   Social Connection and Isolation Panel [NHANES]    Frequency of Communication with Friends and  Family: Twice a week    Frequency of Social Gatherings with Friends and Family: Never    Attends Religious Services: Never    Database administrator or Organizations: No    Attends Banker Meetings: Never    Marital Status: Married  Catering manager Violence: Not At Risk (06/22/2021)   [facility] prior to 10/13/2022., [facility] visits prior to 10/13/2022.   Humiliation, Afraid, Rape, and Kick questionnaire    Fear of Current or Ex-Partner: No    Emotionally Abused: No    Physically Abused: No    Sexually Abused: No     PHYSICAL EXAM  Vitals:   12/18/23 1525  BP: (!) 146/52   Pulse: 70  Weight: 262 lb (118.8 kg)  Height: 5\' 3"  (1.6 m)    Body mass index is 46.41 kg/m.   General: The patient is an obese woman in NAD.      Skin: Extremities are without rash or edema.  Musculoskeletal: Mild tenderness over the classic fibromyalgia tender points of the upper chest and also inner thighs.  Mild joint enlargement in the hands   Joints are not warm.    Neurologic Exam  Mental status: The patient is alert and oriented x 3 at the time of the examination. The patient has apparent normal recent and remote memory, with an apparently normal attention span and concentration ability.   Speech is normal.  Cranial nerves: Extraocular movements are full.   There is good facial sensation to soft touch bilaterally.Facial strength is normal.  Trapezius and sternocleidomastoid strength is normal. No dysarthria is noted.  Hearing is normal.  Motor:  Muscle bulk is normal.   Tone is normal. Strength is  5 / 5 in all 4 extremities.   Sensory: She has intact sensation to touch and vibration in the arms and hands.  She has reduced vibration sensation at the ankles (70%) and toes (20%) compared to the knees.  There is allodynia to touch and altered sensation over the dorsum of both feet, worse on the left..     Coordination: Cerebellar testing reveals good finger-nose-finger and heel-to-shin  Gait and station: Station is normal.   Gait is normal.  The tandem gait is mildly wide.  Romberg is negative.  Reflexes: Deep tendon reflexes are symmetric and 1+ bilaterally.        DIAGNOSTIC DATA (LABS, IMAGING, TESTING) - I reviewed patient records, labs, notes, testing and imaging myself where available.  Lab Results  Component Value Date   WBC 7.3 12/16/2023   HGB 12.6 12/16/2023   HCT 37.9 12/16/2023   MCV 88.8 12/16/2023   PLT 207 12/16/2023      Component Value Date/Time   NA 137 12/16/2023 1025   K 4.1 12/16/2023 1025   CL 100 12/16/2023 1025   CO2 26 12/16/2023 1025    GLUCOSE 134 (H) 12/16/2023 1025   BUN 21 12/16/2023 1025   CREATININE 0.77 12/16/2023 1025   CALCIUM 9.3 12/16/2023 1025   PROT 7.4 12/16/2023 1025   PROT 7.1 12/27/2022 1616   ALBUMIN  3.7 05/20/2019 0128   AST 20 12/16/2023 1025   ALT 12 12/16/2023 1025   ALKPHOS 65 05/20/2019 0128   BILITOT 0.4 12/16/2023 1025   GFRNONAA 96 01/05/2021 1611   GFRAA 111 01/05/2021 1611      ASSESSMENT AND PLAN  MGUS (monoclonal gammopathy of unknown significance)  Neuropathy  OSA (obstructive sleep apnea)  Nocturnal hypoxemia   1.   Continue Horizant  tid for the polyneuropathy.      Unfortunately, other medications have not been beneficial.   2.   Continue the lidocaine  ointment.    3.   Stay active and exercises as tolerated.  4.   Continue nocturnal O2 for nocturnal hypoxemia 5.   Check SPEP/IEF and FLC RTC 12 months, sooner if she has new or worsening neurologic symptoms.   This visit is part of a comprehensive longitudinal care medical relationship regarding the patients primary diagnosis of polyneuropathy and related concerns.   Jacolyn Joaquin A. Godwin Lat, MD, PhD 12/18/2023, 3:57 PM Certified in Neurology, Clinical Neurophysiology, Sleep Medicine, Pain Medicine and Neuroimaging  [facility] [address], [address] [place], [place] [address] [phone]  7

## 2023-12-19 ENCOUNTER — Telehealth: Payer: Self-pay | Admitting: *Deleted

## 2023-12-19 ENCOUNTER — Other Ambulatory Visit: Payer: Self-pay | Admitting: *Deleted

## 2023-12-19 LAB — CBC WITH DIFFERENTIAL/PLATELET
Absolute Lymphocytes: 1591 {cells}/uL (ref 850–3900)
Absolute Monocytes: 540 {cells}/uL (ref 200–950)
Basophils Absolute: 51 {cells}/uL (ref 0–200)
Basophils Relative: 0.7 %
Eosinophils Absolute: 197 {cells}/uL (ref 15–500)
Eosinophils Relative: 2.7 %
HCT: 37.9 % (ref 35.0–45.0)
Hemoglobin: 12.6 g/dL (ref 11.7–15.5)
MCH: 29.5 pg (ref 27.0–33.0)
MCHC: 33.2 g/dL (ref 32.0–36.0)
MCV: 88.8 fL (ref 80.0–100.0)
MPV: 11 fL (ref 7.5–12.5)
Monocytes Relative: 7.4 %
Neutro Abs: 4920 {cells}/uL (ref 1500–7800)
Neutrophils Relative %: 67.4 %
Platelets: 207 10*3/uL (ref 140–400)
RBC: 4.27 10*6/uL (ref 3.80–5.10)
RDW: 14.3 % (ref 11.0–15.0)
Total Lymphocyte: 21.8 %
WBC: 7.3 10*3/uL (ref 3.8–10.8)

## 2023-12-19 LAB — COMPREHENSIVE METABOLIC PANEL WITH GFR
AG Ratio: 1.2 (calc) (ref 1.0–2.5)
ALT: 12 U/L (ref 6–29)
AST: 20 U/L (ref 10–35)
Albumin: 4.1 g/dL (ref 3.6–5.1)
Alkaline phosphatase (APISO): 75 U/L (ref 37–153)
BUN: 21 mg/dL (ref 7–25)
CO2: 26 mmol/L (ref 20–32)
Calcium: 9.3 mg/dL (ref 8.6–10.4)
Chloride: 100 mmol/L (ref 98–110)
Creat: 0.77 mg/dL (ref 0.50–1.05)
Globulin: 3.3 g/dL (ref 1.9–3.7)
Glucose, Bld: 134 mg/dL — ABNORMAL HIGH (ref 65–99)
Potassium: 4.1 mmol/L (ref 3.5–5.3)
Sodium: 137 mmol/L (ref 135–146)
Total Bilirubin: 0.4 mg/dL (ref 0.2–1.2)
Total Protein: 7.4 g/dL (ref 6.1–8.1)
eGFR: 84 mL/min/{1.73_m2} (ref >=60)

## 2023-12-19 LAB — PROTEIN / CREATININE RATIO, URINE
Creatinine, Urine: 31 mg/dL (ref 20–275)
Protein/Creat Ratio: 129 mg/g{creat} (ref 24–184)
Protein/Creatinine Ratio: 0.129 mg/mg{creat} (ref 0.024–0.184)
Total Protein, Urine: 4 mg/dL — ABNORMAL LOW (ref 5–24)

## 2023-12-19 LAB — ANTI-NUCLEAR AB-TITER (ANA TITER): ANA Titer 1: 1:80 {titer} — ABNORMAL HIGH

## 2023-12-19 LAB — C3 AND C4
C3 Complement: 190 mg/dL (ref 83–193)
C4 Complement: 21 mg/dL (ref 15–57)

## 2023-12-19 LAB — ANTI-DNA ANTIBODY, DOUBLE-STRANDED: ds DNA Ab: 1 [IU]/mL

## 2023-12-19 LAB — SEDIMENTATION RATE: Sed Rate: 82 mm/h — ABNORMAL HIGH (ref 0–30)

## 2023-12-19 LAB — ANA: Anti Nuclear Antibody (ANA): POSITIVE — AB

## 2023-12-19 LAB — RNP ANTIBODY: Ribonucleic Protein(ENA) Antibody, IgG: <1 AI

## 2023-12-19 MED ORDER — HYDROXYCHLOROQUINE SULFATE 200 MG PO TABS: ORAL_TABLET | ORAL | Status: DC

## 2023-12-19 NOTE — Progress Notes (Signed)
 ANA 1: 80, stable titer

## 2023-12-19 NOTE — Telephone Encounter (Signed)
 Spoke with patient and advised we received her PLQ eye exam results from her Ophthalmologist. Patient advised they advised she should decrease  the amount of PLQ she needs to be taking. Dr. Alvira Josephs reviewed and advised patient should decrease to PLQ BID Mon-Fri. Patient expressed understanding.

## 2023-12-19 NOTE — Telephone Encounter (Signed)
 Faxed completed/signed form below to [phone]. Received fax confirmation.

## 2023-12-20 ENCOUNTER — Encounter: Payer: Self-pay | Admitting: Neurology

## 2023-12-20 LAB — MULTIPLE MYELOMA PANEL, SERUM
Albumin SerPl Elph-Mcnc: 3.5 g/dL (ref 2.9–4.4)
Albumin/Glob SerPl: 1 (ref 0.7–1.7)
Alpha 1: 0.3 g/dL (ref 0.0–0.4)
Alpha2 Glob SerPl Elph-Mcnc: 1.1 g/dL — ABNORMAL HIGH (ref 0.4–1.0)
B-Globulin SerPl Elph-Mcnc: 1 g/dL (ref 0.7–1.3)
Gamma Glob SerPl Elph-Mcnc: 1.3 g/dL (ref 0.4–1.8)
Globulin, Total: 3.7 g/dL (ref 2.2–3.9)
IgA/Immunoglobulin A, Serum: 314 mg/dL (ref 87–352)
IgG (Immunoglobin G), Serum: 1444 mg/dL (ref 586–1602)
IgM (Immunoglobulin M), Srm: 133 mg/dL (ref 26–217)
Total Protein: 7.2 g/dL (ref 6.0–8.5)

## 2023-12-20 LAB — KAPPA/LAMBDA LIGHT CHAINS
Ig Kappa Free Light Chain: 49.4 mg/L — ABNORMAL HIGH (ref 3.3–19.4)
Ig Lambda Free Light Chain: 33.4 mg/L — ABNORMAL HIGH (ref 5.7–26.3)
KAPPA/LAMBDA RATIO: 1.48 (ref 0.26–1.65)

## 2023-12-24 ENCOUNTER — Ambulatory Visit: Payer: Medicare Other | Admitting: Physician Assistant

## 2023-12-24 DIAGNOSIS — Z8659 Personal history of other mental and behavioral disorders: Secondary | ICD-10-CM

## 2023-12-24 DIAGNOSIS — G4733 Obstructive sleep apnea (adult) (pediatric): Secondary | ICD-10-CM

## 2023-12-24 DIAGNOSIS — M351 Other overlap syndromes: Secondary | ICD-10-CM

## 2023-12-24 DIAGNOSIS — D472 Monoclonal gammopathy: Secondary | ICD-10-CM

## 2023-12-24 DIAGNOSIS — M797 Fibromyalgia: Secondary | ICD-10-CM

## 2023-12-24 DIAGNOSIS — R5383 Other fatigue: Secondary | ICD-10-CM

## 2023-12-24 DIAGNOSIS — M170 Bilateral primary osteoarthritis of knee: Secondary | ICD-10-CM

## 2023-12-24 DIAGNOSIS — M19041 Primary osteoarthritis, right hand: Secondary | ICD-10-CM

## 2023-12-24 DIAGNOSIS — M7061 Trochanteric bursitis, right hip: Secondary | ICD-10-CM

## 2023-12-24 DIAGNOSIS — Z79899 Other long term (current) drug therapy: Secondary | ICD-10-CM

## 2023-12-24 DIAGNOSIS — Z8639 Personal history of other endocrine, nutritional and metabolic disease: Secondary | ICD-10-CM

## 2023-12-24 DIAGNOSIS — M3500 Sicca syndrome, unspecified: Secondary | ICD-10-CM

## 2023-12-24 DIAGNOSIS — Z9049 Acquired absence of other specified parts of digestive tract: Secondary | ICD-10-CM

## 2023-12-24 DIAGNOSIS — M51369 Other intervertebral disc degeneration, lumbar region without mention of lumbar back pain or lower extremity pain: Secondary | ICD-10-CM

## 2024-01-02 ENCOUNTER — Ambulatory Visit: Payer: Medicare Other | Admitting: Neurology

## 2024-01-03 ENCOUNTER — Other Ambulatory Visit: Payer: Self-pay | Admitting: Physician Assistant

## 2024-01-03 DIAGNOSIS — M351 Other overlap syndromes: Secondary | ICD-10-CM

## 2024-01-07 NOTE — Telephone Encounter (Signed)
 Last Fill: 07/17/2023  Eye exam: 12/31/2023   Labs: 12/16/2023 Urine protein creatinine ratio normal, glucose mildly elevated, probably not a fasting sample, sed rate is elevated at 82.  CBC normal, anti-DNA negative RNP negative, complements normal, ANA pending.  Elevated sedimentation rate indicates recent flare.   Next Visit: 05/21/2024  Last Visit: 12/16/2023  DX:Mixed connective tissue disease   Current Dose per office note on 12/16/2023: Plaquenil  to twice daily Monday to Friday.   Okay to refill Plaquenil ?

## 2024-02-12 HISTORY — PX: TEAR DUCT PROBING: SHX793

## 2024-02-12 HISTORY — PX: OTHER SURGICAL HISTORY: SHX169

## 2024-02-27 ENCOUNTER — Ambulatory Visit: Admitting: Pulmonary Disease

## 2024-03-16 ENCOUNTER — Telehealth: Payer: Self-pay | Admitting: *Deleted

## 2024-03-16 NOTE — Telephone Encounter (Signed)
 Recommend scheduling an ultrasound of both hands first rather than a normal office visit

## 2024-03-16 NOTE — Telephone Encounter (Signed)
 Patient contacted the office stating she was seen in the office a few months ago. Patient states at that time she was having trouble with frequent flares in her hands. Patient states she is still having flares in her hands and the pain is constant. Patient states she has tried heat and ice. Patient states she has also tried Tylenol  and Aleve, 6 at a time. Patient states she is getting no relief. Patient states she is having a hard time with ADLs and is having to have her husband dress her. Patient states her hands are swelling. Patient states she is unable to sleep In her bed due to the pain in her hands and her shoulders. Patient has been sleeping in her recliner. Patient did schedule an appointment to discuss treatment options for 03/23/2024. Patient would like to know if there is anything she can do in the meantime. Please advise.

## 2024-03-17 NOTE — Telephone Encounter (Signed)
 Patient advised Susi is apprehensive to prescribe prednisone or have her take any NSAIDs since this may mask the results of the ultrasound.

## 2024-03-17 NOTE — Telephone Encounter (Signed)
 Please call the patient with any cancellations.  I am apprehensive to prescribe prednisone or have her take any NSAIDs since this may mask the results of the ultrasound.

## 2024-03-17 NOTE — Telephone Encounter (Signed)
 Attempted to contact the patient and left message for patient to call the office.

## 2024-03-17 NOTE — Telephone Encounter (Signed)
 Scheduled pt an ultrasound appointment with Dr. Dolphus on 04/08/24. Pt was crying on the phone stating she was in so much pain that she may not be able to wait that long. I advised pt we will put her on our wait list. Pts f/u appt with Kerman has been cancelled (03/23/24)

## 2024-03-19 ENCOUNTER — Ambulatory Visit

## 2024-03-19 ENCOUNTER — Ambulatory Visit: Attending: Rheumatology | Admitting: Rheumatology

## 2024-03-19 ENCOUNTER — Encounter: Payer: Self-pay | Admitting: Rheumatology

## 2024-03-19 ENCOUNTER — Other Ambulatory Visit: Payer: Self-pay

## 2024-03-19 VITALS — BP 149/75 | HR 96 | Resp 16 | Ht 63.0 in | Wt 259.2 lb

## 2024-03-19 DIAGNOSIS — M79641 Pain in right hand: Secondary | ICD-10-CM | POA: Diagnosis present

## 2024-03-19 DIAGNOSIS — D472 Monoclonal gammopathy: Secondary | ICD-10-CM | POA: Insufficient documentation

## 2024-03-19 DIAGNOSIS — Z8639 Personal history of other endocrine, nutritional and metabolic disease: Secondary | ICD-10-CM | POA: Insufficient documentation

## 2024-03-19 DIAGNOSIS — M3500 Sicca syndrome, unspecified: Secondary | ICD-10-CM | POA: Insufficient documentation

## 2024-03-19 DIAGNOSIS — M7061 Trochanteric bursitis, right hip: Secondary | ICD-10-CM | POA: Diagnosis present

## 2024-03-19 DIAGNOSIS — M797 Fibromyalgia: Secondary | ICD-10-CM | POA: Insufficient documentation

## 2024-03-19 DIAGNOSIS — Z79899 Other long term (current) drug therapy: Secondary | ICD-10-CM

## 2024-03-19 DIAGNOSIS — M51369 Other intervertebral disc degeneration, lumbar region without mention of lumbar back pain or lower extremity pain: Secondary | ICD-10-CM | POA: Insufficient documentation

## 2024-03-19 DIAGNOSIS — M7062 Trochanteric bursitis, left hip: Secondary | ICD-10-CM | POA: Insufficient documentation

## 2024-03-19 DIAGNOSIS — R0602 Shortness of breath: Secondary | ICD-10-CM | POA: Insufficient documentation

## 2024-03-19 DIAGNOSIS — G4733 Obstructive sleep apnea (adult) (pediatric): Secondary | ICD-10-CM | POA: Diagnosis present

## 2024-03-19 DIAGNOSIS — M351 Other overlap syndromes: Secondary | ICD-10-CM | POA: Insufficient documentation

## 2024-03-19 DIAGNOSIS — Z87448 Personal history of other diseases of urinary system: Secondary | ICD-10-CM | POA: Diagnosis present

## 2024-03-19 DIAGNOSIS — M170 Bilateral primary osteoarthritis of knee: Secondary | ICD-10-CM | POA: Insufficient documentation

## 2024-03-19 DIAGNOSIS — M19042 Primary osteoarthritis, left hand: Secondary | ICD-10-CM | POA: Diagnosis present

## 2024-03-19 DIAGNOSIS — M19041 Primary osteoarthritis, right hand: Secondary | ICD-10-CM | POA: Diagnosis not present

## 2024-03-19 DIAGNOSIS — M79642 Pain in left hand: Secondary | ICD-10-CM | POA: Insufficient documentation

## 2024-03-19 DIAGNOSIS — Z8659 Personal history of other mental and behavioral disorders: Secondary | ICD-10-CM | POA: Diagnosis present

## 2024-03-19 DIAGNOSIS — R5383 Other fatigue: Secondary | ICD-10-CM | POA: Insufficient documentation

## 2024-03-19 MED ORDER — DICLOFENAC SODIUM 1 % EX GEL
CUTANEOUS | 2 refills | Status: AC
Start: 2024-03-19 — End: ?

## 2024-03-19 NOTE — Patient Instructions (Signed)
 Azathioprine Injection What is this medication? AZATHIOPRINE (ay za THYE oh preen) prevents the body from rejecting an organ transplant. It works by lowering the body's immune system response. This helps the body accept the donor organ. It may also be used to treat rheumatoid arthritis. This medicine may be used for other purposes; ask your health care provider or pharmacist if you have questions. COMMON BRAND NAME(S): Imuran What should I tell my care team before I take this medication? They need to know if you have any of these conditions: Infection Kidney disease Liver disease An unusual or allergic reaction to azathioprine, lactose, other medications, foods, dyes, or preservatives Pregnant or trying to get pregnant Breastfeeding How should I use this medication? This medication is injected or infused into a vein. It is given by a care team in a hospital or clinic setting. Talk to your care team about the use of this medication in children. Special care may be needed. Overdosage: If you think you have taken too much of this medicine contact a poison control center or emergency room at once. NOTE: This medicine is only for you. Do not share this medicine with others. What if I miss a dose? This does not apply. What may interact with this medication? Do not take this medication with any of the following: Febuxostat Mercaptopurine This medication may also interact with the following: Allopurinol Aminosalicylates, such as sulfasalazine, mesalamine, balsalazide, and olsalazine Leflunomide Medications called ACE inhibitors, such as benazepril, captopril, enalapril, fosinopril, quinapril, lisinopril, ramipril, and trandolapril Mycophenolate Sulfamethoxazole; trimethoprim Vaccines Warfarin This list may not describe all possible interactions. Give your health care provider a list of all the medicines, herbs, non-prescription drugs, or dietary supplements you use. Also tell them if you  smoke, drink alcohol, or use illegal drugs. Some items may interact with your medicine. What should I watch for while using this medication? Your condition will be monitored carefully while you are receiving this medication. This medication may increase your risk of getting an infection. Call your care team for advice if you get a fever, chills, sore throat, or other symptoms of a cold or flu. Do not treat yourself. Try to avoid being around people who are sick. Talk to your care team about your risk of cancer. You may be more at risk for certain types of cancer if you take this medication. Talk to your care team if you may be pregnant. This medication can cause serious birth defects if taken during pregnancy. This medication may cause infertility. Talk to your care team if you are concerned about your fertility. What side effects may I notice from receiving this medication? Side effects that you should report to your care team as soon as possible: Allergic reactions--skin rash, itching, hives, swelling of the face, lips, tongue, or throat Change in your skin, such as a new growth, a sore that doesn't heal, or a change in a mole Dizziness, loss of balance or coordination, confusion or trouble speaking Infection--fever, chills, cough, sore throat, wounds that don't heal, pain or trouble when passing urine, general feeling of discomfort or being unwell Low red blood cell level--unusual weakness or fatigue, dizziness, headache, trouble breathing Unusual bruising or bleeding Side effects that usually do not require medical attention (report to your care team if they continue or are bothersome): Diarrhea Fatigue Nausea Vomiting This list may not describe all possible side effects. Call your doctor for medical advice about side effects. You may report side effects to FDA at 1-800-FDA-1088. Where  should I keep my medication? This medication is given in a hospital or clinic. It will not be stored at  home. NOTE: This sheet is a summary. It may not cover all possible information. If you have questions about this medicine, talk to your doctor, pharmacist, or health care provider.  2024 [employer]vier/Gold Standard (2022-01-02 00:00:00)  Standing Labs We placed an order today for your standing lab work.   Please have your standing labs drawn in 2 weeks x 2 and then every 3 months  Please have your labs drawn 2 weeks prior to your appointment so that the provider can discuss your lab results at your appointment, if possible.  Please note that you may see your imaging and lab results in MyChart before we have reviewed them. We will contact you once all results are reviewed. Please allow our office up to 72 hours to thoroughly review all of the results before contacting the office for clarification of your results.  WALK-IN LAB HOURS  Monday through Thursday from 8:00 am -12:30 pm and 1:00 pm-4:30 pm and Friday from 8:00 am-12:00 pm.  Patients with office visits requiring labs will be seen before walk-in labs.  You may encounter longer than normal wait times. Please allow additional time. Wait times may be shorter on  Monday and Thursday afternoons.  We do not book appointments for walk-in labs. We appreciate your patience and understanding with our staff.   Labs are drawn by [facility]. Please bring your co-pay at the time of your lab draw.  You may receive a bill from [facility] for your lab work.  Please note if you are on Hydroxychloroquine  and and an order has been placed for a Hydroxychloroquine  level,  you will need to have it drawn 4 hours or more after your last dose.  If you wish to have your labs drawn at another location, please call the office 24 hours in advance so we can fax the orders.  The office is located at [address], [place], [place] [address]   If you have any questions regarding directions or hours of operation,  please call [phone].   As a reminder, please  drink plenty of water prior to coming for your lab work. Thanks!   Vaccines You are taking a medication(s) that can suppress your immune system.  The following immunizations are recommended: Flu annually Covid-19  Td/Tdap (tetanus, diphtheria, pertussis) every 10 years Pneumonia (Prevnar 15 then Pneumovax 23 at least 1 year apart.  Alternatively, can take Prevnar 20 without needing additional dose) Shingrix: 2 doses from 4 weeks to 6 months apart  Please check with your PCP to make sure you are up to date.

## 2024-03-19 NOTE — Telephone Encounter (Signed)
 Pending TPMT results, patient will be starting Imuran per Dr. Dolphus.   Consent obtained by Devki at the appointment today. Thanks!

## 2024-03-19 NOTE — Progress Notes (Signed)
 Pharmacy Note  Subjective: Patient presents today to [facility] for follow up office visit. 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  Previous therapy includes: methotrexate  (N/V)  Objective: CMP     Component Value Date/Time   NA 137 12/16/2023 1025   K 4.1 12/16/2023 1025   CL 100 12/16/2023 1025   CO2 26 12/16/2023 1025   GLUCOSE 134 (H) 12/16/2023 1025   BUN 21 12/16/2023 1025   CREATININE 0.77 12/16/2023 1025   CALCIUM 9.3 12/16/2023 1025   PROT 7.2 12/18/2023 1631   ALBUMIN  3.7 05/20/2019 0128   AST 20 12/16/2023 1025   ALT 12 12/16/2023 1025   ALKPHOS 65 05/20/2019 0128   BILITOT 0.4 12/16/2023 1025   GFRNONAA 96 01/05/2021 1611   GFRAA 111 01/05/2021 1611    CBC    Component Value Date/Time   WBC 7.3 12/16/2023 1025   RBC 4.27 12/16/2023 1025   HGB 12.6 12/16/2023 1025   HCT 37.9 12/16/2023 1025   PLT 207 12/16/2023 1025   MCV 88.8 12/16/2023 1025   MCH 29.5 12/16/2023 1025   MCHC 33.2 12/16/2023 1025   RDW 14.3 12/16/2023 1025   LYMPHSABS 1,226 02/05/2023 1449   MONOABS 0.7 04/17/2019 0202   EOSABS 197 12/16/2023 1025   BASOSABS 51 12/16/2023 1025    Baseline Immunosuppressant Therapy Labs TB GOLD   Hepatitis Panel    Latest Ref Rng & Units 04/16/2017   11:17 AM  Hepatitis  Hep B Surface Ag NON-REACTIVE NON-REACTIVE   Hep B IgM NON-REACTIVE NON REACTIVE   Hep C Ab NON-REACTIVE NON-REACTIVE   Hep A IgM NON-REACTIVE NON-REACTIVE    HIV No results found for: HIV Immunoglobulins    Latest Ref Rng & Units 12/18/2023    4:31 PM  Immunoglobulin Electrophoresis  IgG 586 - 1,602 mg/dL 8,555   IgM 26 - 782 mg/dL 866    SPEP    Latest Ref Rng & Units 12/18/2023    4:31 PM  Serum Protein Electrophoresis  Total Protein 6.0 - 8.5 g/dL 7.2    H3EI Lab Results  Component Value Date   G6PDH 22.4 (H) 04/16/2017   TPMT No results found for: TPMT   Chest x-ray: 05/19/2019 -  negative  Assessment/Plan: Patient was counseled on the purpose, proper use, and adverse effects of azathioprine including risk of infection, nausea, rash, and hair loss. Discussed risk of myelosupression and reviewed importance of frequent lab work to monitor blood counts.  Standing orders placed.  Reviewed drug-drug interactions including contraindication with allopurinol.  Provided patient with educational materials on azathioprine and answered all questions.  Patient consented to azathioprine.  Will upload consent into the media tab.   Patient dose will be azathioprine 150mg  once daily.  Prescription pending labs results and/or insurance approval.  Colton Tassin, PharmD, MPH, BCPS, CPP Clinical Pharmacist (Rheumatology and Pulmonology)

## 2024-03-19 NOTE — Progress Notes (Signed)
 Office Visit Note  Patient: Gloria Porter             Date of Birth: 04-Mar-1956           MRN: [id number]             PCP: Terrill Medford (Inactive) Referring: No ref. provider found Visit Date: 03/19/2024 Occupation: @GUAROCC @  Subjective:  Pain in multiple joints  History of Present Illness: Gloria Porter is a 68 y.o. female with mixed connective tissue disease.  She came today due to increased pain and swelling in her hands.  She states she has difficulty doing routine activities due to increased pain and swelling in her hands.  She has difficulty holding objects.  She has been taking Plaquenil  200 mg p.o. twice daily without any interruption.  She continues to have discomfort in her shoulders, hips and her knee joints.  She continues to have some shortness of breath.  She states she has not gone for a follow-up visit with the pulmonologist.  Her workup in the past was negative for ILD.   Activities of Daily Living:  Patient reports morning stiffness for 3-5 hours.   Patient Reports nocturnal pain.  Difficulty dressing/grooming: Reports Difficulty climbing stairs: Reports Difficulty getting out of chair: Reports Difficulty using hands for taps, buttons, cutlery, and/or writing: Reports  Review of Systems  Constitutional:  Positive for fatigue.  HENT:  Negative for mouth sores and mouth dryness.   Eyes:  Negative for dryness.  Respiratory:  Positive for shortness of breath.   Cardiovascular:  Negative for chest pain and palpitations.  Gastrointestinal:  Positive for constipation. Negative for blood in stool and diarrhea.  Endocrine: Negative for increased urination.  Genitourinary:  Negative for involuntary urination.  Musculoskeletal:  Positive for joint pain, gait problem, joint pain, joint swelling, myalgias, muscle weakness, morning stiffness, muscle tenderness and myalgias.  Skin:  Positive for color change. Negative for rash, hair loss and sensitivity to  sunlight.  Allergic/Immunologic: Negative for susceptible to infections.  Neurological:  Positive for dizziness and headaches.  Hematological:  Negative for swollen glands.  Psychiatric/Behavioral:  Positive for depressed mood and sleep disturbance. The patient is not nervous/anxious.     PMFS History:  Patient Active Problem List   Diagnosis Date Noted   History of lumbar surgery 09/16/2019   Pseudoarthrosis of lumbar spine 03/02/2019   Primary osteoarthritis of both hands 04/10/2017   Primary osteoarthritis of both knees 04/10/2017   DDD (degenerative disc disease), lumbar 04/10/2017   Mixed connective tissue disease (HCC) 04/10/2017   S/P trigger finger release bilateral third 03/14/2017   MGUS (monoclonal gammopathy of unknown significance) 03/11/2017   History of chronic kidney disease 03/11/2017   Monoclonal paraproteinemia 01/11/2016   Restless leg 01/11/2015   Carpal tunnel syndrome 12/28/2014   Dysphagia, oropharyngeal 12/28/2014   Anxiety, generalized 10/14/2014   Generalized joint pain 08/17/2014   Encounter for therapeutic drug monitoring 07/13/2014   Failed back syndrome of lumbar spine 07/13/2014   Encounter for therapeutic drug level monitoring 07/13/2014   ACTH excess, central (HCC) 06/16/2014   Diabetes (HCC) 06/16/2014   Fibrositis 06/16/2014   Cannot sleep 06/16/2014   Extreme obesity 06/16/2014   Neuropathy 06/16/2014   Allergic rhinitis, seasonal 06/16/2014   Post menopausal syndrome 06/16/2014   Sjogren's syndrome (HCC) 06/16/2014   Spinal stenosis 06/16/2014   Adult hypothyroidism 06/16/2014   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HCC) 06/16/2014   Diabetes mellitus (HCC) 06/16/2014   Fibromyalgia 06/16/2014  Morbid obesity (HCC) 06/16/2014   Sjogren's syndrome (HCC) 06/16/2014   Postmenopausal HRT (hormone replacement therapy) 06/16/2014   Breath shortness 01/21/2014   Essential (primary) hypertension 01/21/2014   Acid reflux 01/21/2014   Adiposity  01/21/2014   Depression with anxiety 08/26/2013   OSA (obstructive sleep apnea) 01/17/2013   Cushing's syndrome (HCC) 05/23/2011   Pituitary adenoma (HCC) 05/23/2011    Past Medical History:  Diagnosis Date   Anxiety    Arthritis    Cushing's disease (HCC)    pituitary tumor removed 2012   DDD (degenerative disc disease), lumbar    Diabetes mellitus    Endocrinologist Dr. Elsie Sharps   Difficult intubation    2011 scratched trachea   Dyspnea    upon exertion   Fibromyalgia    Gastroparesis    GERD (gastroesophageal reflux disease)    Headache(784.0)    Hypertension    Hypothyroidism    Lumbar disc disease    MGUS (monoclonal gammopathy of unknown significance)    Mixed connective tissue disease (HCC)    Neuropathy    Pneumonia    Sjogren's disease (HCC)    Sleep apnea    no cpap.sleep study 2006 [facility]    Family History  Problem Relation Age of Onset   Diabetes type II Mother    Diabetes type II Father    Heart disease Father    Heart attack Father    Colon cancer Maternal Grandmother    Heart disease Maternal Grandfather    Past Surgical History:  Procedure Laterality Date   APPENDECTOMY     BACK SURGERY     lumbar fusion   BIOPSY  01/15/2020   Procedure: BIOPSY;  Surgeon: Rollin Dover, MD;  Location: [facility];  Service: Endoscopy;;   BRAIN SURGERY     For cushings   CARPAL TUNNEL RELEASE Bilateral    CATARACT EXTRACTION, BILATERAL     CESAREAN SECTION  10/31/1973   COLONOSCOPY     COLONOSCOPY WITH PROPOFOL  N/A 01/15/2020   Procedure: COLONOSCOPY WITH PROPOFOL ;  Surgeon: Rollin Dover, MD;  Location: [facility];  Service: Endoscopy;  Laterality: N/A;   COMBINED HYSTEROSCOPY DIAGNOSTIC / D&C     ENDOMETRIAL ABLATION     endoscopic dacryocystorhinostomy left  02/12/2024   ESOPHAGOGASTRODUODENOSCOPY (EGD) WITH PROPOFOL  N/A 01/15/2020   Procedure: ESOPHAGOGASTRODUODENOSCOPY (EGD) WITH PROPOFOL ;  Surgeon: Rollin Dover, MD;  Location: [facility];  Service: Endoscopy;  Laterality: N/A;   ESOPHAGOGASTRODUODENOSCOPY ENDOSCOPY     FRACTURE SURGERY     l arm   LUMBAR LAMINECTOMY/DECOMPRESSION MICRODISCECTOMY  11/13/2011   Procedure: LUMBAR LAMINECTOMY/DECOMPRESSION MICRODISCECTOMY;  Surgeon: Darina MALVA Boehringer, MD;  Location: [facility];  Service: Neurosurgery;  Laterality: Bilateral;  Bilateral Lumbar four-five Decompression   POLYPECTOMY  01/15/2020   Procedure: POLYPECTOMY;  Surgeon: Rollin Dover, MD;  Location: [facility];  Service: Endoscopy;;   TONSILLECTOMY     TRANSPHENOIDAL / TRANSNASAL HYPOPHYSECTOMY / RESECTION PITUITARY TUMOR     TRIGGER FINGER RELEASE     TUBAL LIGATION     Social History   Social History Narrative   Not on file   Immunization History  Administered Date(s) Administered   Influenza Inj Mdck Quad Pf 07/12/2017, 07/14/2018   Influenza Split 06/13/2017   Moderna Covid-19 Fall Seasonal Vaccine 26yrs & older 06/19/2022   Moderna Covid-19 Vaccine Bivalent Booster 33yrs & up 06/05/2021   Moderna Sars-Covid-2 Vaccination 11/03/2019, 12/01/2019, 06/16/2020, 12/16/2020   Pneumococcal Conjugate-13 12/28/2014   Pneumococcal Polysaccharide-23 01/03/2017  Objective: Vital Signs: BP (!) 149/75 (BP Location: Left Wrist, Patient Position: Sitting, Cuff Size: Normal)   Pulse 96   Resp 16   Ht 5' 3 (1.6 m)   Wt 259 lb 3.2 oz (117.6 kg)   LMP 11/08/2006   BMI 45.92 kg/m    Physical Exam Vitals and nursing note reviewed.  Constitutional:      Appearance: She is well-developed.  HENT:     Head: Normocephalic and atraumatic.  Eyes:     Conjunctiva/sclera: Conjunctivae normal.  Cardiovascular:     Rate and Rhythm: Normal rate and regular rhythm.     Heart sounds: Normal heart sounds.  Pulmonary:     Effort: Pulmonary effort is normal.     Breath sounds: Normal breath sounds.  Abdominal:     General: Bowel sounds are normal.     Palpations: Abdomen is soft.  Musculoskeletal:      Cervical back: Normal range of motion.  Lymphadenopathy:     Cervical: No cervical adenopathy.  Skin:    General: Skin is warm and dry.     Capillary Refill: Capillary refill takes less than 2 seconds.  Neurological:     Mental Status: She is alert and oriented to person, place, and time.  Psychiatric:        Behavior: Behavior normal.      Musculoskeletal Exam: Patient was examined in seated position.  She had limited range of motion of cervical spine with discomfort.  She had discomfort on range of motion of her thoracic and lumbar spine.  Thoracic kyphosis was noted.  She had painful range of motion of bilateral shoulders which were in full range of motion.  Elbow joints and wrist joints in good range of motion.  She has tenderness across MCPs PIPs and DIPs.  She had difficulty making a fist.  She had tenderness over bilateral trochanteric bursa.  Knee joints in good range of motion without any warmth swelling or effusion.  There was no tenderness over her ankles or MTPs.  CDAI Exam: CDAI Score: -- Patient Global: --; Provider Global: -- Swollen: --; Tender: -- Joint Exam 03/19/2024   No joint exam has been documented for this visit   There is currently no information documented on the homunculus. Go to the Rheumatology activity and complete the homunculus joint exam.  Investigation: No additional findings.  Imaging: US  LIMITED JOINT SPACE STRUCTURES UP BILAT Result Date: 03/19/2024 Ultrasound examination of bilateral hands was performed per EULAR recommendations. Using 15 MHz transducer, grayscale and power Doppler bilateral second and third MCP joints  both dorsal and volar aspects were evaluated to look for synovitis or tenosynovitis. The findings were there was  synovitis and tenosynovitis noted on ultrasound examination. Impression: Synovitis and tenosynovitis was noted on the limited ultrasound examination of both hands.   Recent Labs: Lab Results  Component Value Date    WBC 7.3 12/16/2023   HGB 12.6 12/16/2023   PLT 207 12/16/2023   NA 137 12/16/2023   K 4.1 12/16/2023   CL 100 12/16/2023   CO2 26 12/16/2023   GLUCOSE 134 (H) 12/16/2023   BUN 21 12/16/2023   CREATININE 0.77 12/16/2023   BILITOT 0.4 12/16/2023   ALKPHOS 65 05/20/2019   AST 20 12/16/2023   ALT 12 12/16/2023   PROT 7.2 12/18/2023   ALBUMIN  3.7 05/20/2019   CALCIUM 9.3 12/16/2023   GFRAA 111 01/05/2021    Speciality Comments: PLQ eye exam: 12/31/2023 [facility]  Follow up in 1 year Methotrexate -GI side effects  Procedures:  No procedures performed Allergies: Codeine, Methotrexate , Atorvastatin, and Quinapril hcl   Assessment / Plan:     Visit Diagnoses: Mixed connective tissue disease (HCC) -she has been experiencing increased pain and discomfort in her both hands.  She also gives history of frequent swelling in her hands and difficulty making a fist.  She states she has difficulty doing routine activities.  She also gives history of discomfort in her shoulders, hips, knees and her ankles.  Ultrasound examination of bilateral hands was performed which showed synovitis in her MCP joints.  Patient could not tolerate methotrexate  in the past.  She had been taking Plaquenil  200 mg p.o. twice daily which is not effective in controlling her symptoms.  I did detailed discussion with her regarding adding Imuran.  Indications side effects contraindications of Imuran were discussed at length.  Will obtain TPMT today.  Once the labs are available we will start her on Imuran 50 mg p.o. daily for 2 weeks if the labs are stable will increase the dose to 100 mg p.o. daily.  I will obtain following labs today.  Plan: CBC with Differential/Platelet, Comprehensive metabolic panel with GFR, Sedimentation rate, ANA, Anti-DNA antibody, double-stranded, C3 and C4, RNP Antibody, Protein / creatinine ratio, urine.  Medication counseling:  TPMT: Pending  Patient was counseled on the purpose,  proper use, and adverse effects of azathioprine including risk of infection, nausea, rash, and hair loss.  Reviewed risk of cancer after long term use.  Discussed risk of myelosupression and reviewed importance of frequent lab work to monitor blood counts.  Reviewed drug-drug interactions including contraindication with allopurinol.  Provided patient with educational materials on azathioprine and answered all questions.  Patient consented to azathioprine.  Will upload consent into the media tab.    High risk medication use -CBC and CMP were normal on Dec 16, 2023.  Will repeat the labs today.  She will have labs 2 weeks x 2 and then every 3 months after starting Imuran.  I will also obtain following labs prior to starting on Imuran.  Plan: Hepatitis B core antibody, IgM, Hepatitis B surface antigen, Hepatitis C antibody, QuantiFERON-TB Gold Plus, Thiopurine methyltransferase(tpmt)rbc  Pain in both hands -she complains of ongoing pain and discomfort in the bilateral hands and having difficulty making a fist.  Plan: US  LIMITED JOINT SPACE STRUCTURES UP BILAT.  Limited ultrasound examination of bilateral hands was obtained which showed synovitis in bilateral 2nd and 3rd MCP joints.  Primary osteoarthritis of both hands-she also has osteoarthritis involving PIP and DIP joints which causes discomfort.  Sicca complex (HCC)-SSA negative, SSB negative.  She continues to have sicca symptoms.  Over-the-counter products were discussed.  Trochanteric bursitis of both hips-she continues to have bilateral trochanteric bursa discomfort.  IT band stretches were emphasized.  Primary osteoarthritis of both knees-chronic discomfort.  No warmth swelling or effusion was noted.  Patient requested a prescription for Voltaren  gel which was sent.  Degeneration of intervertebral disc of lumbar region without discogenic back pain or lower extremity pain-she continues to have chronic pain.  Fibromyalgia-she has generalized pain  and discomfort from fibromyalgia.  Need for regular exercise was emphasized.  Other fatigue-related to fibromyalgia.  Shortness of breath-patient was evaluated by pulmonologist in the past.  High-resolution CT was negative for ILD in 2019.  Other medical problems are listed as follows:  History of Cushing disease  History of diabetes mellitus  History of chronic kidney disease  History of hypothyroidism  MGUS (monoclonal gammopathy of unknown significance)-followed by oncology.  History of anxiety  History of depression  OSA (obstructive sleep apnea)  Orders: Orders Placed This Encounter  Procedures   US  LIMITED JOINT SPACE STRUCTURES UP BILAT   CBC with Differential/Platelet   Comprehensive metabolic panel with GFR   Sedimentation rate   Hepatitis B core antibody, IgM   Hepatitis B surface antigen   Hepatitis C antibody   QuantiFERON-TB Gold Plus   Thiopurine methyltransferase(tpmt)rbc   ANA   Anti-DNA antibody, double-stranded   C3 and C4   RNP Antibody   Protein / creatinine ratio, urine   Meds ordered this encounter  Medications   diclofenac  Sodium (VOLTAREN ) 1 % GEL    Sig: Apply 2-4 grams to affected joint 4 times daily as needed.    Dispense:  400 g    Refill:  2     Follow-Up Instructions: Return in about 2 months (around 05/19/2024) for MCTD, Osteoarthritis.   Maya Nash, MD  Note - This record has been created using Animal nutritionist.  Chart creation errors have been sought, but may not always  have been located. Such creation errors do not reflect on  the standard of medical care.

## 2024-03-22 ENCOUNTER — Ambulatory Visit: Payer: Self-pay | Admitting: Rheumatology

## 2024-03-22 NOTE — Progress Notes (Signed)
 CBC normal CMP shows elevated glucose, sed rate is elevated at 104, complements normal, urine protein is mildly elevated, ANA remains positive, double-stranded DNA negative, RNP negative, hepatitis B and hepatitis C nonreactive, TB Gold pending, TPMT pending.  Please forward results to her PCP.

## 2024-03-23 ENCOUNTER — Ambulatory Visit: Admitting: Physician Assistant

## 2024-03-25 ENCOUNTER — Telehealth: Payer: Self-pay

## 2024-03-25 NOTE — Telephone Encounter (Signed)
 Patient called about her lab results. Patient request call back.

## 2024-03-26 LAB — CBC WITH DIFFERENTIAL/PLATELET
Absolute Lymphocytes: 1325 {cells}/uL (ref 850–3900)
Absolute Monocytes: 837 {cells}/uL (ref 200–950)
Basophils Absolute: 47 {cells}/uL (ref 0–200)
Basophils Relative: 0.5 %
Eosinophils Absolute: 103 {cells}/uL (ref 15–500)
Eosinophils Relative: 1.1 %
HCT: 39.5 % (ref 35.0–45.0)
Hemoglobin: 12.6 g/dL (ref 11.7–15.5)
MCH: 28.8 pg (ref 27.0–33.0)
MCHC: 31.9 g/dL — ABNORMAL LOW (ref 32.0–36.0)
MCV: 90.2 fL (ref 80.0–100.0)
MPV: 10.5 fL (ref 7.5–12.5)
Monocytes Relative: 8.9 %
Neutro Abs: 7088 {cells}/uL (ref 1500–7800)
Neutrophils Relative %: 75.4 %
Platelets: 259 Thousand/uL (ref 140–400)
RBC: 4.38 Million/uL (ref 3.80–5.10)
RDW: 14.4 % (ref 11.0–15.0)
Total Lymphocyte: 14.1 %
WBC: 9.4 Thousand/uL (ref 3.8–10.8)

## 2024-03-26 LAB — ANTI-DNA ANTIBODY, DOUBLE-STRANDED: ds DNA Ab: 1 [IU]/mL

## 2024-03-26 LAB — COMPREHENSIVE METABOLIC PANEL WITH GFR
AG Ratio: 1.1 (calc) (ref 1.0–2.5)
ALT: 13 U/L (ref 6–29)
AST: 12 U/L (ref 10–35)
Albumin: 3.8 g/dL (ref 3.6–5.1)
Alkaline phosphatase (APISO): 91 U/L (ref 37–153)
BUN: 13 mg/dL (ref 7–25)
CO2: 25 mmol/L (ref 20–32)
Calcium: 10 mg/dL (ref 8.6–10.4)
Chloride: 102 mmol/L (ref 98–110)
Creat: 0.67 mg/dL (ref 0.50–1.05)
Globulin: 3.4 g/dL (ref 1.9–3.7)
Glucose, Bld: 198 mg/dL — ABNORMAL HIGH (ref 65–99)
Potassium: 4.1 mmol/L (ref 3.5–5.3)
Sodium: 139 mmol/L (ref 135–146)
Total Bilirubin: 0.5 mg/dL (ref 0.2–1.2)
Total Protein: 7.2 g/dL (ref 6.1–8.1)
eGFR: 96 mL/min/1.73m2 (ref >=60)

## 2024-03-26 LAB — QUANTIFERON-TB GOLD PLUS
Mitogen-NIL: 2.01 [IU]/mL
NIL: 0.01 [IU]/mL
QuantiFERON-TB Gold Plus: NEGATIVE
TB1-NIL: 0.02 [IU]/mL
TB2-NIL: 0.01 [IU]/mL

## 2024-03-26 LAB — ANTI-NUCLEAR AB-TITER (ANA TITER)
ANA TITER: 1:40 {titer} — ABNORMAL HIGH
ANA Titer 1: 1:320 {titer} — ABNORMAL HIGH

## 2024-03-26 LAB — C3 AND C4
C3 Complement: 220 mg/dL — ABNORMAL HIGH (ref 83–193)
C4 Complement: 25 mg/dL (ref 15–57)

## 2024-03-26 LAB — HEPATITIS B CORE ANTIBODY, IGM: Hep B C IgM: NONREACTIVE

## 2024-03-26 LAB — THIOPURINE METHYLTRANSFERASE (TPMT), RBC: Thiopurine Methyltransferase, RBC: 16 nmol/h/mL

## 2024-03-26 LAB — SEDIMENTATION RATE: Sed Rate: 104 mm/h — ABNORMAL HIGH (ref 0–30)

## 2024-03-26 LAB — PROTEIN / CREATININE RATIO, URINE
Creatinine, Urine: 45 mg/dL (ref 20–275)
Protein/Creat Ratio: 200 mg/g{creat} — ABNORMAL HIGH (ref 24–184)
Protein/Creatinine Ratio: 0.2 mg/mg{creat} — ABNORMAL HIGH (ref 0.024–0.184)
Total Protein, Urine: 9 mg/dL (ref 5–24)

## 2024-03-26 LAB — ANA: Anti Nuclear Antibody (ANA): POSITIVE — AB

## 2024-03-26 LAB — HEPATITIS C ANTIBODY: Hepatitis C Ab: NONREACTIVE

## 2024-03-26 LAB — RNP ANTIBODY: Ribonucleic Protein(ENA) Antibody, IgG: <1 AI

## 2024-03-26 LAB — HEPATITIS B SURFACE ANTIGEN: Hepatitis B Surface Ag: NONREACTIVE

## 2024-03-26 MED ORDER — AZATHIOPRINE 50 MG PO TABS: ORAL_TABLET | ORAL | 0 refills | Status: DC

## 2024-03-26 NOTE — Telephone Encounter (Signed)
 Imuran  50 mg p.o. daily for 2 weeks if the labs are stable will increase the dose to 100 mg p.o. daily.

## 2024-03-26 NOTE — Progress Notes (Signed)
 CBC, CMP were normal except glucose was elevated at 198.  Sed rate was elevated at 104.  Urine protein creatinine ratio was elevated at 200.  Complements normal.  ANA positive at 1: 320.  Hepatitis panel negative, TB Gold negative, TPMT normal, double-stranded DNA negative, RNP negative.  Please schedule an earlier appointment to start on Imuran .

## 2024-03-30 ENCOUNTER — Telehealth: Payer: Self-pay

## 2024-03-30 NOTE — Telephone Encounter (Signed)
 Contacted the patient , she was concerned as to when she should come in for her labs since we are closed on 04/13/2024 advised that she could come in on the 04/14/2024. Patient verbalized understanding.

## 2024-03-30 NOTE — Telephone Encounter (Signed)
 Patient Left a VM stating she wanted to touch base about the new prescription from Dr. Dolphus for her hands. Patient requests a call back.

## 2024-04-03 ENCOUNTER — Emergency Department (HOSPITAL_COMMUNITY)

## 2024-04-03 ENCOUNTER — Encounter (HOSPITAL_COMMUNITY): Payer: Self-pay

## 2024-04-03 ENCOUNTER — Emergency Department (HOSPITAL_COMMUNITY)
Admission: EM | Admit: 2024-04-03 | Discharge: 2024-04-03 | Disposition: A | Discharge status: Home / Self Care | Attending: Emergency Medicine | Admitting: Emergency Medicine

## 2024-04-03 DIAGNOSIS — Z794 Long term (current) use of insulin: Secondary | ICD-10-CM | POA: Insufficient documentation

## 2024-04-03 DIAGNOSIS — S76301A Unspecified injury of muscle, fascia and tendon of the posterior muscle group at thigh level, right thigh, initial encounter: Secondary | ICD-10-CM | POA: Diagnosis present

## 2024-04-03 DIAGNOSIS — W19XXXA Unspecified fall, initial encounter: Secondary | ICD-10-CM

## 2024-04-03 DIAGNOSIS — E162 Hypoglycemia, unspecified: Secondary | ICD-10-CM | POA: Insufficient documentation

## 2024-04-03 DIAGNOSIS — D72829 Elevated white blood cell count, unspecified: Secondary | ICD-10-CM | POA: Diagnosis not present

## 2024-04-03 DIAGNOSIS — W1830XA Fall on same level, unspecified, initial encounter: Secondary | ICD-10-CM | POA: Insufficient documentation

## 2024-04-03 LAB — URINALYSIS, ROUTINE W REFLEX MICROSCOPIC
Bilirubin Urine: NEGATIVE
Glucose, UA: >=500 mg/dL — AB
Hgb urine dipstick: NEGATIVE
Ketones, ur: NEGATIVE mg/dL
Leukocytes,Ua: NEGATIVE
Nitrite: NEGATIVE
Protein, ur: NEGATIVE mg/dL
Specific Gravity, Urine: 1.008 (ref 1.005–1.030)
pH: 5 (ref 5.0–8.0)

## 2024-04-03 LAB — COMPREHENSIVE METABOLIC PANEL WITH GFR
ALT: 10 U/L (ref 0–44)
AST: 34 U/L (ref 15–41)
Albumin: 3.2 g/dL — ABNORMAL LOW (ref 3.5–5.0)
Alkaline Phosphatase: 67 U/L (ref 38–126)
Anion gap: 11 (ref 5–15)
BUN: 16 mg/dL (ref 8–23)
CO2: 23 mmol/L (ref 22–32)
Calcium: 8.5 mg/dL — ABNORMAL LOW (ref 8.9–10.3)
Chloride: 101 mmol/L (ref 98–111)
Creatinine, Ser: 0.76 mg/dL (ref 0.44–1.00)
GFR, Estimated: >60 mL/min (ref >60)
Glucose, Bld: 132 mg/dL — ABNORMAL HIGH (ref 70–99)
Potassium: 4 mmol/L (ref 3.5–5.1)
Sodium: 135 mmol/L (ref 135–145)
Total Bilirubin: 1.3 mg/dL — ABNORMAL HIGH (ref 0.0–1.2)
Total Protein: 7.5 g/dL (ref 6.5–8.1)

## 2024-04-03 LAB — CBC WITH DIFFERENTIAL/PLATELET
Abs Immature Granulocytes: 0.06 K/uL (ref 0.00–0.07)
Basophils Absolute: 0.1 K/uL (ref 0.0–0.1)
Basophils Relative: 1 %
Eosinophils Absolute: 0.1 K/uL (ref 0.0–0.5)
Eosinophils Relative: 1 %
HCT: 40.1 % (ref 36.0–46.0)
Hemoglobin: 11.9 g/dL — ABNORMAL LOW (ref 12.0–15.0)
Immature Granulocytes: 1 %
Lymphocytes Relative: 10 %
Lymphs Abs: 1.3 K/uL (ref 0.7–4.0)
MCH: 29.3 pg (ref 26.0–34.0)
MCHC: 29.7 g/dL — ABNORMAL LOW (ref 30.0–36.0)
MCV: 98.8 fL (ref 80.0–100.0)
Monocytes Absolute: 1 K/uL (ref 0.1–1.0)
Monocytes Relative: 8 %
Neutro Abs: 10.5 K/uL — ABNORMAL HIGH (ref 1.7–7.7)
Neutrophils Relative %: 79 %
Platelets: 210 K/uL (ref 150–400)
RBC: 4.06 MIL/uL (ref 3.87–5.11)
RDW: 16 % — ABNORMAL HIGH (ref 11.5–15.5)
WBC: 13.1 K/uL — ABNORMAL HIGH (ref 4.0–10.5)
nRBC: 0 % (ref 0.0–0.2)

## 2024-04-03 LAB — CBG MONITORING, ED
Glucose-Capillary: 132 mg/dL — ABNORMAL HIGH (ref 70–99)
Glucose-Capillary: 143 mg/dL — ABNORMAL HIGH (ref 70–99)

## 2024-04-03 MED ORDER — OXYCODONE-ACETAMINOPHEN 5-325 MG PO TABS: 1.0000 | ORAL_TABLET | Freq: Four times a day (QID) | ORAL | 0 refills | Status: AC | PRN

## 2024-04-03 MED ORDER — ACETAMINOPHEN 500 MG PO TABS: 500.0000 mg | ORAL_TABLET | Freq: Every evening | ORAL | Status: DC | PRN

## 2024-04-03 MED ORDER — METHOCARBAMOL 500 MG PO TABS: 500.0000 mg | ORAL_TABLET | Freq: Four times a day (QID) | ORAL | Status: DC | PRN

## 2024-04-03 MED ORDER — B COMPLEX-C PO TABS
1.0000 | ORAL_TABLET | Freq: Every day | ORAL | Status: DC
  Administered 2024-04-03: 1 via ORAL
  Filled 2024-04-03: qty 1

## 2024-04-03 MED ORDER — DICLOFENAC SODIUM 1 % EX GEL: 2.0000 g | Freq: Four times a day (QID) | CUTANEOUS | Status: DC | PRN

## 2024-04-03 MED ORDER — LOSARTAN POTASSIUM 50 MG PO TABS: 50.0000 mg | ORAL_TABLET | Freq: Every day | ORAL | Status: DC

## 2024-04-03 MED ORDER — BUPROPION HCL ER (XL) 150 MG PO TB24
150.0000 mg | ORAL_TABLET | Freq: Every day | ORAL | Status: DC
  Administered 2024-04-03: 150 mg via ORAL
  Filled 2024-04-03: qty 1

## 2024-04-03 MED ORDER — AZATHIOPRINE 50 MG PO TABS
50.0000 mg | ORAL_TABLET | Freq: Every day | ORAL | Status: DC
  Filled 2024-04-03: qty 1

## 2024-04-03 MED ORDER — ONDANSETRON 4 MG PO TBDP
4.0000 mg | ORAL_TABLET | Freq: Once | ORAL | Status: AC
  Administered 2024-04-03: 4 mg via ORAL
  Filled 2024-04-03: qty 1

## 2024-04-03 MED ORDER — OXYCODONE HCL 5 MG PO TABS
5.0000 mg | ORAL_TABLET | Freq: Once | ORAL | Status: AC
  Administered 2024-04-03: 5 mg via ORAL
  Filled 2024-04-03: qty 1

## 2024-04-03 MED ORDER — THYROID 60 MG PO TABS
300.0000 mg | ORAL_TABLET | Freq: Every day | ORAL | Status: DC
  Filled 2024-04-03: qty 5

## 2024-04-03 MED ORDER — ONDANSETRON HCL 4 MG PO TABS: 4.0000 mg | ORAL_TABLET | Freq: Four times a day (QID) | ORAL | 0 refills | Status: AC

## 2024-04-03 MED ORDER — LORAZEPAM 1 MG PO TABS: 4.0000 mg | ORAL_TABLET | Freq: Every day | ORAL | Status: DC

## 2024-04-03 MED ORDER — OYSTER SHELL CALCIUM/D3 500-5 MG-MCG PO TABS
2.0000 | ORAL_TABLET | Freq: Every day | ORAL | Status: DC
  Administered 2024-04-03: 2 via ORAL
  Filled 2024-04-03: qty 2

## 2024-04-03 MED ORDER — OXYCODONE HCL 5 MG PO TABS: 5.0000 mg | ORAL_TABLET | Freq: Four times a day (QID) | ORAL | Status: DC | PRN

## 2024-04-03 MED ORDER — EMPAGLIFLOZIN 25 MG PO TABS
25.0000 mg | ORAL_TABLET | Freq: Every day | ORAL | Status: DC
  Administered 2024-04-03: 25 mg via ORAL
  Filled 2024-04-03: qty 1

## 2024-04-03 MED ORDER — VITAMIN D 25 MCG (1000 UNIT) PO TABS
5000.0000 [IU] | ORAL_TABLET | Freq: Every day | ORAL | Status: DC
  Administered 2024-04-03: 5000 [IU] via ORAL
  Filled 2024-04-03: qty 5

## 2024-04-03 MED ORDER — FUROSEMIDE 40 MG PO TABS
40.0000 mg | ORAL_TABLET | Freq: Every day | ORAL | Status: DC
  Filled 2024-04-03: qty 1

## 2024-04-03 MED ORDER — DIPHENHYDRAMINE HCL 25 MG PO CAPS: 50.0000 mg | ORAL_CAPSULE | Freq: Every evening | ORAL | Status: DC | PRN

## 2024-04-03 MED ORDER — FLUTICASONE PROPIONATE 50 MCG/ACT NA SUSP: 2.0000 | NASAL | Status: DC

## 2024-04-03 MED ORDER — LORATADINE 10 MG PO TABS
10.0000 mg | ORAL_TABLET | Freq: Every day | ORAL | Status: DC
  Administered 2024-04-03: 10 mg via ORAL
  Filled 2024-04-03: qty 1

## 2024-04-03 MED ORDER — ONDANSETRON HCL 4 MG/2ML IJ SOLN: 4.0000 mg | Freq: Once | INTRAMUSCULAR | Status: DC

## 2024-04-03 MED ORDER — DIPHENHYDRAMINE-APAP (SLEEP) 25-500 MG PO TABS: 2.0000 | ORAL_TABLET | Freq: Every day | ORAL | Status: DC

## 2024-04-03 MED ORDER — INSULIN ASPART 100 UNIT/ML IJ SOLN
0.0000 [IU] | Freq: Three times a day (TID) | INTRAMUSCULAR | Status: DC
  Filled 2024-04-03: qty 0.2

## 2024-04-03 MED ORDER — INSULIN ASPART 100 UNIT/ML IJ SOLN
0.0000 [IU] | Freq: Every day | INTRAMUSCULAR | Status: DC
  Filled 2024-04-03: qty 0.05

## 2024-04-03 NOTE — ED Provider Notes (Signed)
 [place] EMERGENCY DEPARTMENT AT [facility] Provider Note   CSN: [id number] Arrival date & time: 04/03/24  0128     Patient presents with: Gloria Porter   Gloria Porter is a 68 y.o. female.   The history is provided by the patient and the EMS personnel.  Fall  Gloria Porter is a 68 y.o. female who presents to the Emergency Department complaining of fall. She presents the emergency department for evaluation following a fall that occurred when she was bending over to pick up some items she dropped out of her wallet. She fell into her left side but felt like her right leg went out awkwardly and she complains of pain from the calf to the thigh on the right leg. She is unable to bear weight secondary to pain. The fall occurred around 10 PM. She has been unable to stand since the fall occurred. No fever, vomiting, diarrhea. She does have a history of mixed connective tissue disorder and did just change from plaquenil  to Imuran  on Monday due to uncontrolled pain.      Prior to Admission medications   Medication Sig Start Date End Date Taking? Authorizing Provider  ACCU-CHEK FASTCLIX LANCETS MISC  03/23/14   [provider]  Acetaminophen  (TYLENOL  PO) Take by mouth.    [provider]  ARMOUR THYROID  300 MG tablet 300mg  by mouth 5 days per week and 150mg  by mouth 2 days per week 12/26/20   [provider]  azaTHIOprine  (IMURAN ) 50 MG tablet Take 1 tablet (50 mg) po daily x 2 weeks. If labs are stable increase to 2 tablets (100 mg) po daily. 03/26/24   Dolphus Reiter, MD  B Complex-C (SUPER B COMPLEX PO) Take by mouth.    [provider]  betamethasone dipropionate (DIPROLENE) 0.05 % ointment Apply 1 application topically as needed.    [provider]  buPROPion  (WELLBUTRIN  XL) 150 MG 24 hr tablet Take 300 mg by mouth daily.    [provider]  Calcium  Carb-Cholecalciferol  (CALCIUM  1000 + D PO) Take 1 tablet by mouth daily.      [provider]  Cholecalciferol  (VITAMIN D -3) 125 MCG (5000 UT) TABS Take 5,000 Units by mouth daily.     [provider]  Continuous Blood Gluc Sensor (FREESTYLE LIBRE 2 SENSOR) MISC by Does not apply route.    [provider]  diclofenac  Sodium (VOLTAREN ) 1 % GEL Apply 2-4 grams to affected joint 4 times daily as needed. 03/19/24   Dolphus Reiter, MD  dimenhyDRINATE  (DRAMAMINE PO) Take 1 Dose by mouth as needed. Patient not taking: Reported on 03/19/2024    [provider]  diphenhydramine -acetaminophen  (TYLENOL  PM) 25-500 MG TABS tablet Take 3 tablets by mouth at bedtime.    [provider]  empagliflozin  (JARDIANCE ) 25 MG TABS tablet Take 25 mg by mouth daily.  02/19/18   [provider]  fluticasone  (FLONASE ) 50 MCG/ACT nasal spray Place into both nostrils as needed. 02/16/22   [provider]  FREESTYLE LITE test strip daily. 08/22/17   [provider]  furosemide  (LASIX ) 40 MG tablet Take 40 mg by mouth daily.    [provider]  HORIZANT  600 MG TBCR Take 1 tablet (600 mg total) by mouth 3 (three) times daily. 12/18/23   Sater, Charlie LABOR, MD  hydroxychloroquine  (PLAQUENIL ) 200 MG tablet Take 1 tablet 200 mg BID Monday-Friday Patient not taking: Reported on 03/19/2024 12/19/23   Dolphus Reiter, MD  hydroxychloroquine  (PLAQUENIL ) 200  MG tablet TAKE 1 TABLET TWICE A DAY, MONDAY THROUGH FRIDAY FOR MIXED CONNECTIVE TISSUE DISEASE 01/07/24   Dolphus Reiter, MD  Insulin  Regular Human (HUMULIN R  IJ) Inject 1 Dose as directed as needed. Sliding scale U 500    [provider]  Insulin  Syringe-Needle U-100 31G X 5/16 0.5 ML MISC USE TO INJECT UP TO THREE TIMES A DAY 07/28/18   [provider]  lidocaine  (XYLOCAINE ) 5 % ointment APPLY 1 APPLICATION TOPICALLY AS NEEDED TO FEET UP TO 4 TIMES A DAY 12/18/23   Sater, Charlie LABOR, MD  loratadine  (CLARITIN ) 10 MG tablet Take 10 mg by mouth daily.    [provider]  LORazepam  (ATIVAN ) 2 MG tablet Take 4 mg by mouth at bedtime. 07/05/20   [provider]  losartan  (COZAAR ) 50 MG tablet Take 50 mg by mouth daily after supper.  12/23/14 04/16/28  [provider]  MELATONIN PO Take 20 mg by mouth daily.    [provider]  montelukast  (SINGULAIR ) 10 MG tablet Take 10 mg by mouth daily.    [provider]  Multiple Vitamin (MULTIVITAMIN PO) Take by mouth daily.    [provider]  Multiple Vitamins-Minerals (ZINC  PO) Take by mouth daily. With copper    [provider]  Naproxen Sodium (ALEVE PO) Take by mouth. Patient not taking: Reported on 03/19/2024    [provider]  PANTOPRAZOLE  SODIUM PO 40 mg daily. 11/14/20   [provider]  ramelteon (ROZEREM) 8 MG tablet Take 8 mg by mouth at bedtime.    [provider]  rosuvastatin (CRESTOR) 5 MG tablet Take 5 mg by mouth daily.    [provider]  Semaglutide  (OZEMPIC , 0.25 OR 0.5 MG/DOSE, [place]) Inject 1 Dose into the skin once a week.    [provider]    Allergies: Codeine, Methotrexate , Atorvastatin, and Quinapril hcl    Review of Systems  All other systems reviewed and are negative.   Updated Vital Signs BP (!) 144/56   Pulse 77   Temp 98.5 F (36.9 C)   Resp 19   LMP 11/08/2006   SpO2 94%   Physical Exam Vitals and nursing note reviewed.  Constitutional:      Appearance: She is well-developed.  HENT:     Head: Normocephalic and atraumatic.  Cardiovascular:     Rate and Rhythm: Normal rate and regular rhythm.  Pulmonary:     Effort: Pulmonary effort is normal. No respiratory distress.  Abdominal:     Palpations: Abdomen is soft.     Tenderness: There is no abdominal tenderness. There is no guarding or rebound.  Musculoskeletal:     Comments: There is mild tenderness to palpation over the right hip. No appreciable tenderness over the knee, thigh, shin. She is able to flex and extend  the knee. 2+ DP pulses bilaterally  Skin:    General: Skin is warm and dry.  Neurological:     Mental Status: She is alert and oriented to person, place, and time.  Psychiatric:        Behavior: Behavior normal.     (all labs ordered are listed, but only abnormal results are displayed) Labs Reviewed  COMPREHENSIVE METABOLIC PANEL WITH GFR - Abnormal; Notable for the following components:      Result Value   Glucose, Bld 132 (*)    Calcium  8.5 (*)    Albumin  3.2 (*)    Total Bilirubin 1.3 (*)    All  other components within normal limits  CBC WITH DIFFERENTIAL/PLATELET - Abnormal; Notable for the following components:   WBC 13.1 (*)    Hemoglobin 11.9 (*)    MCHC 29.7 (*)    RDW 16.0 (*)    Neutro Abs 10.5 (*)    All other components within normal limits  URINALYSIS, ROUTINE W REFLEX MICROSCOPIC - Abnormal; Notable for the following components:   APPearance HAZY (*)    Glucose, UA >=500 (*)    Bacteria, UA RARE (*)    All other components within normal limits  CBG MONITORING, ED - Abnormal; Notable for the following components:   Glucose-Capillary 132 (*)    All other components within normal limits    EKG: EKG Interpretation Date/Time:  Friday April 03 2024 02:20:35 EDT Ventricular Rate:  77 PR Interval:  183 QRS Duration:  114 QT Interval:  400 QTC Calculation: 453 R Axis:   -48  Text Interpretation: Sinus rhythm Incomplete left bundle branch block Left ventricular hypertrophy Anterior Q waves, possibly due to LVH Confirmed by Griselda Norris [phone]) on 04/03/2024 2:22:07 AM  Radiology: CT Hip Right Wo Contrast Result Date: 04/03/2024 CLINICAL DATA:  Fall.  Hip fracture suspected. EXAM: CT OF THE RIGHT HIP WITHOUT CONTRAST TECHNIQUE: Multidetector CT imaging of the right hip was performed according to the standard protocol. Multiplanar CT image reconstructions were also generated. RADIATION DOSE REDUCTION: This exam was performed according to the departmental  dose-optimization program which includes automated exposure control, adjustment of the mA and/or kV according to patient size and/or use of iterative reconstruction technique. COMPARISON:  None Available. FINDINGS: Bones/Joint/Cartilage No right femoral neck fracture. No right superior or inferior pubic ramus fracture. No dislocation of the right femoral head. Joint space in the right hip is relatively well preserved without substantial hypertrophic spurring. Ligaments Suboptimally assessed by CT. Muscles and Tendons 2.2 x 3.1 x 3.3 cm collection of soft tissue density identified in the region of the hamstring insertion (axial 27/10), potentially hematoma or avulsion/muscle injury. Soft tissues Unremarkable. IMPRESSION: 1. No right femoral neck fracture. No right superior or inferior pubic ramus fracture. 2. 2.2 x 3.1 x 3.3 cm collection of soft tissue density in the region of the hamstring insertion, potentially hematoma or avulsion/muscle injury. MRI of the right hip recommended to further evaluate. Electronically Signed   By: Camellia Candle M.D.   On: 04/03/2024 06:45   DG Hip Unilat W or Wo Pelvis 2-3 Views Right Result Date: 04/03/2024 CLINICAL DATA:  Fall, right hip pain EXAM: DG HIP (WITH OR WITHOUT PELVIS) 2-3V RIGHT COMPARISON:  None Available. FINDINGS: Postoperative changes in the lower lumbar spine. Hip joints and SI joints symmetric. No acute bony abnormality. Specifically, no fracture, subluxation, or dislocation. IMPRESSION: No acute bony abnormality. Electronically Signed   By: Franky Crease M.D.   On: 04/03/2024 02:31   DG Chest 2 View Result Date: 04/03/2024 CLINICAL DATA:  Fall, shortness of breath EXAM: CHEST - 2 VIEW COMPARISON:  None Available. FINDINGS: Linear areas of atelectasis or scarring in the mid lungs bilaterally. No additional confluent airspace opacities, effusions or pneumothorax. Heart and mediastinal contours within normal limits. No acute bony abnormality. IMPRESSION: Mid  lung atelectasis or scarring bilaterally. No active disease. Electronically Signed   By: Franky Crease M.D.   On: 04/03/2024 02:30   DG Knee Complete 4 Views Right Result Date: 04/03/2024 CLINICAL DATA:  Fall, knee pain EXAM: RIGHT KNEE - COMPLETE 4+ VIEW COMPARISON:  None Available. FINDINGS: Slight joint  space narrowing in the medial compartment. No acute bony abnormality. Specifically, no fracture, subluxation, or dislocation. No joint effusion. IMPRESSION: No acute bony abnormality. Electronically Signed   By: Franky Crease M.D.   On: 04/03/2024 02:29     Procedures   Medications Ordered in the ED  oxyCODONE  (Oxy IR/ROXICODONE ) immediate release tablet 5 mg (has no administration in time range)  methocarbamol  (ROBAXIN ) tablet 500 mg (has no administration in time range)  oxyCODONE  (Oxy IR/ROXICODONE ) immediate release tablet 5 mg (5 mg Oral Given 04/03/24 0339)  ondansetron  (ZOFRAN -ODT) disintegrating tablet 4 mg (4 mg Oral Given 04/03/24 0340)                                    Medical Decision Making Amount and/or Complexity of Data Reviewed Labs: ordered. Radiology: ordered.  Risk Prescription drug management.   Patient with history of mixed connective tissue disorder here for evaluation following a fall. She did have transient hypoglycemia, but she took her insulin  and fell prior to being able to eat dinner. CBC with mild leukocytosis, no evidence of acute infectious process. UA is not consistent with UTI. Patient has a lot of pain to the right posterior thigh, cannot bear weight secondary to pain  CT scan is negative for fracture but does demonstrate hematoma, possible hamstring tear. Clinically this does fit with her symptoms. Discussed with Elsa with orthopedics - recommendation for knee immobilizer with weight-bearing as tolerated. Patient currently uses a role later in the home, concerned that she may have difficulty with ambulation. PT consult and TOC consult placed.      Final diagnoses:  Right hamstring injury, initial encounter  Fall, initial encounter    ED Discharge Orders     None          Griselda Norris, MD 04/03/24 [phone]

## 2024-04-03 NOTE — Progress Notes (Signed)
 Orthopedic Tech Progress Note Patient Details:  Gloria Porter Jan 06, 1956 [id number]  Ortho Devices Type of Ortho Device: Knee Immobilizer Ortho Device/Splint Location: RLE Ortho Device/Splint Interventions: Application   Post Interventions Patient Tolerated: Well  [name] 04/03/2024, 9:16 AM

## 2024-04-03 NOTE — ED Notes (Signed)
 Placed purewick on pt unable to bare weight on the right leg bc of fall

## 2024-04-03 NOTE — ED Provider Notes (Signed)
 Patient signed to me by Dr. Griselda.  Patient has been seen by our [facility] team.  Home health has been arranged.  Repeat compartment exam on patient's right thigh shows no evidence of compartment syndrome at this time.  Neurovasc intact at her right foot.   Gloria Faden, MD 04/03/24 1209

## 2024-04-03 NOTE — Progress Notes (Addendum)
 Patient has Medicare A/B without [employer] Waiver and requires a 3 midnight INPATIENT stay in order to be placed into SNF. EDP notified via secure chat.   Addend @ 10:47AM CSW spoke with pt at bedside to discuss discharge plan. Pt verbalized understanding that she is not meeting admission criteria. Pt was offered choice and reviewed Medicare.gov and has selected [facility]. ICM supervisor notified via secure chat and will arrange [facility] services.   Throughout discussion, pt reports she feels that she can manage at home. She states her husband works 2nd shift but her sister lives 3 miles and is able to come and assist/walk their dog. Pt stated her husband had a hip surgery and has the following DME that she can use: Wheelchair, Rollator, and BSC. Pt stated her husband could pick her up but she just would need assistance getting into his car given that it is an SUV.  Addend @ 12:54PM PT stating pt is needing SNF. Per EDP, pt does not meet admission criteria and is unable to remain in the ER to see PT on a consistent basis. CSW informed HH is the only thing insurance will cover.

## 2024-04-03 NOTE — TOC CM/SW Note (Signed)
 Received call from Jada LCSW, patient needing HHC service at discharge. She offered choice to the patient and pt chose [facility]. Channing with [facility] called for arrangements and stated that she will see the patient this weekend. No other needs identified at this time.  [facility] RN,MHA,CCM Inpatient Care Management Supervisor [phone]

## 2024-04-03 NOTE — Discharge Instructions (Addendum)
 Physical therapy, Occupational Therapy, home health aide consults have been placed

## 2024-04-03 NOTE — ED Notes (Signed)
 [facility] called for transport

## 2024-04-03 NOTE — Progress Notes (Signed)
 PT Note  Patient Details Name: Gloria Porter MRN: [id number] DOB: 1956/06/16   Cancelled Treatment:    Reason Eval/Treat Not Completed: Other (comment). PT eval order received. PT to await recommendations from [facility] ortho consult. PT to continue to follow acutely.   [place], PT Acute Rehab   [place] VEAR Drone 04/03/2024, 10:11 AM

## 2024-04-03 NOTE — ED Triage Notes (Signed)
 Pt comes via [facility] from home after a mechanical fall, R knee gave out. CBG was 64 on scene with no symptoms, pt given snack

## 2024-04-03 NOTE — Evaluation (Addendum)
 Physical Therapy Evaluation Patient Details Name: Gloria Porter MRN: [id number] DOB: Apr 21, 1956 Today's Date: 04/03/2024  History of Present Illness  68 yo female presents to therapy on 04/03/2024 from ED following mechanical fall in home on 04/02/2024. Pt reports R LE pain and unable to WB since fall. X-ray of R knee negative for acute findings, CT of R hip and pelvis negative for acute fractures however soft tissue density noted and potential hematoma or avulsion/mm injury of hamstring and warrants MRI. Pt PMH includes but is not limited to anxiety, arthritis, cushing's dz, DDD of lumbar region,DM II, DOE, fibromyalgia, GERD, HA, HTN, Sjogren's dz, OSA, MGUS, neuropathy, and connective tissue disorder mostly effecting B UE and neck.  Clinical Impression      Pt admitted with above diagnosis.  Pt currently with functional limitations due to the deficits listed below (see PT Problem List). PT reached out to MD per ortho consult, MD made PT aware pt is R LE NWB with KI donned. Pt in bed when PT arrived to ED. Pt on 3 L/min when PT arrived. Pt reports using supplemental O2 at night only. Pt O2 saturation on RA >/=92% during therapy eval. Pt agreeable to PT eval. R KI donned. Pt required max A x 2 for supine <> sit and mod A x 1 and max A x1 for sit to stand  from EOB with PT managing R LE to maintain R LE NWB with KI donned, once in standing pt limited by pain 15s with mod A x 2 and cues for posture, R LE NWB and encouragement pt screaming in pain and returned to EOB. Pt left in bed, all needs in place. PT concerned per safe d/c home at this time and recommends continued inpatient follow up therapy, <3 hours/day.  Pt will benefit from acute skilled PT to increase their independence and safety with mobility to allow discharge.       If plan is discharge home, recommend the following: Two people to help with walking and/or transfers;A lot of help with bathing/dressing/bathroom;Assistance with  cooking/housework;Assist for transportation;Help with stairs or ramp for entrance   Can travel by private vehicle   No    Equipment Recommendations Hospital bed;Hoyer lift (pending d/c setting)  Recommendations for Other Services       Functional Status Assessment Patient has had a recent decline in their functional status and demonstrates the ability to make significant improvements in function in a reasonable and predictable amount of time.     Precautions / Restrictions Precautions Precautions: Fall Required Braces or Orthoses: Knee Immobilizer - Right Restrictions Weight Bearing Restrictions Per Provider Order: Yes RLE Weight Bearing Per Provider Order: Non weight bearing      Mobility  Bed Mobility Overal bed mobility: Needs Assistance Bed Mobility: Supine to Sit, Sit to Supine     Supine to sit: +2 for physical assistance, +2 for safety/equipment, HOB elevated, Mod assist Sit to supine: Max assist, +2 for physical assistance, +2 for safety/equipment   General bed mobility comments: cues and increased time with minimal abiltiy to transition LE to EOB, pt required mod A for trunk, pt required max A x 2 to return to supine and total A x 2 for repositioning in bed    Transfers Overall transfer level: Needs assistance Equipment used: Rolling walker (2 wheels) Transfers: Sit to/from Stand Sit to Stand: Max assist, +2 physical assistance, +2 safety/equipment, From elevated surface, Mod assist           General  transfer comment: PT managed R LE with KI donned to ensure pt maintained R LE NWB status, pt required max A x  1 and mod A x 1 for sit to stand from EOB and mod A x 2 with PT supporting R LE to maintain static standing at RW for ~ 15s pt unable to tolerate additional standing or progression with SPT due to elevated pain response of 9/10 and pt screaming in pain requesting to return to bed    Ambulation/Gait               General Gait Details: NT due to  pain and difficulty maintaining R LE NWB with KI donned and extensive assist for sit to stand  Stairs            Wheelchair Mobility     Tilt Bed    Modified Rankin (Stroke Patients Only)       Balance Overall balance assessment: Needs assistance, History of Falls Sitting-balance support: Bilateral upper extremity supported, Feet supported (L LE only) Sitting balance-Leahy Scale: Fair     Standing balance support: Bilateral upper extremity supported, Reliant on assistive device for balance (L LE support only) Standing balance-Leahy Scale: Zero                               Pertinent Vitals/Pain Pain Assessment Pain Assessment: 0-10 Pain Score: 9  Pain Location: R LE Pain Descriptors / Indicators: Aching, Constant, Discomfort, Grimacing, Guarding Pain Intervention(s): Limited activity within patient's tolerance, Monitored during session, Premedicated before session, Repositioned, Ice applied    Home Living Family/patient expects to be discharged to:: Private residence Living Arrangements: Spouse/significant other Available Help at Discharge: Family Type of Home: House Home Access: Stairs to enter Entrance Stairs-Rails:  (has some support for 2 series of steps) Entrance Stairs-Number of Steps: 2 (+2 +2)   Home Layout: One level Home Equipment: Agricultural consultant (2 wheels);Rollator (4 wheels);Wheelchair - manual;BSC/3in1 Additional Comments: pt is home alone from 3-midnight. 3 L/ min supplemental O2 in home setting at night only    Prior Function Prior Level of Function : Independent/Modified Independent             Mobility Comments: rollator for long distances at [facility], IND for all ADLs and self care tasks       Extremity/Trunk Assessment   Upper Extremity Assessment Upper Extremity Assessment: Defer to OT evaluation    Lower Extremity Assessment Lower Extremity Assessment: Generalized weakness (R LE NT)    Cervical / Trunk  Assessment Cervical / Trunk Assessment: Normal  Communication   Communication Communication: No apparent difficulties    Cognition Arousal: Alert Behavior During Therapy: WFL for tasks assessed/performed   PT - Cognitive impairments: No apparent impairments                         Following commands: Intact       Cueing       General Comments      Exercises     Assessment/Plan    PT Assessment Patient needs continued PT services  PT Problem List Decreased strength;Decreased range of motion;Decreased activity tolerance;Decreased mobility;Decreased balance;Decreased coordination;Pain       PT Treatment Interventions DME instruction;Gait training;Stair training;Functional mobility training;Therapeutic activities;Therapeutic exercise;Balance training;Neuromuscular re-education;Patient/family education;Modalities    PT Goals (Current goals can be found in the Care Plan section)  Acute Rehab PT Goals Patient Stated Goal:  to get pain undercontrol PT Goal Formulation: With patient Time For Goal Achievement: 04/23/24 Potential to Achieve Goals: Fair    Frequency Min 3X/week     Co-evaluation               AM-PAC PT 6 Clicks Mobility  Outcome Measure Help needed turning from your back to your side while in a flat bed without using bedrails?: A Lot Help needed moving from lying on your back to sitting on the side of a flat bed without using bedrails?: Total Help needed moving to and from a bed to a chair (including a wheelchair)?: Total Help needed standing up from a chair using your arms (e.g., wheelchair or bedside chair)?: Total Help needed to walk in hospital room?: Total Help needed climbing 3-5 steps with a railing? : Total 6 Click Score: 7    End of Session Equipment Utilized During Treatment: Gait belt Activity Tolerance: Patient limited by pain Patient left: in bed;with call bell/phone within reach Nurse Communication: Mobility status;Need  for lift equipment PT Visit Diagnosis: Unsteadiness on feet (R26.81);Other abnormalities of gait and mobility (R26.89);Muscle weakness (generalized) (M62.81);Difficulty in walking, not elsewhere classified (R26.2);Pain;History of falling (Z91.81) Pain - Right/Left: Right Pain - part of body: Knee;Leg    Time: 1035-1100 PT Time Calculation (min) (ACUTE ONLY): 25 min   Charges:   PT Evaluation $PT Eval Low Complexity: 1 Low PT Treatments $Therapeutic Activity: 8-22 mins PT General Charges $$ ACUTE PT VISIT: 1 Visit         [place], PT Acute Rehab   [place] VEAR Drone 04/03/2024, 1:06 PM

## 2024-04-08 ENCOUNTER — Other Ambulatory Visit: Admitting: Rheumatology

## 2024-05-14 ENCOUNTER — Other Ambulatory Visit: Payer: Self-pay

## 2024-05-14 MED ORDER — AZATHIOPRINE 50 MG PO TABS: ORAL_TABLET | ORAL | 0 refills | Status: DC

## 2024-05-14 NOTE — Telephone Encounter (Signed)
 Okay to get labs at the follow-up visit.

## 2024-05-14 NOTE — Addendum Note (Signed)
 Addended by: CENA ALFONSO CROME on: 05/14/2024 04:35 PM   Modules accepted: Orders

## 2024-05-14 NOTE — Telephone Encounter (Signed)
 Patient called the office stating that she was started on a new medication (Azathioprine ) and was due to come in for labs 2 weeks after started but has fallen an tore her hamstring in her right leg. Dr has her on strict rules as far as putting weight on the leg an driving so she has not been able to get in to do the labs. Patient has an upcoming appointment on 05/21/2024 an would like to know if she can get some more medication to get her through this appointment as she doesn't have much family around to help transport her around to get the labs done now. Please advise.

## 2024-05-14 NOTE — Telephone Encounter (Signed)
 kay to get labs at the follow-up visit. Patient is out of her medication the Imuran . Patient has not increased her medication to 2 tabs since she has not had her labs.   Last Fill: 03/26/2024  Labs: 04/03/2024 WBC 13.1, Hgb 11.9, MCHC 29.7, RDW 16.0, Neutro Abs 10.5, Glucose  132, Calcium  8.5, Albumin  3.2, Total Bilirubin 1.3  Next Visit: 05/21/2024  Last Visit: 03/19/2024  DX: Mixed connective tissue disease   Current Dose per office note 03/19/2024: Imuran  50 mg p.o. daily for 2 weeks if the labs are stable will increase the dose to 100 mg p.o. daily.   Okay to refill Imuran ?

## 2024-05-21 ENCOUNTER — Ambulatory Visit: Admitting: Physician Assistant

## 2024-06-04 ENCOUNTER — Other Ambulatory Visit: Payer: Self-pay | Admitting: *Deleted

## 2024-06-04 ENCOUNTER — Telehealth: Payer: Self-pay | Admitting: Rheumatology

## 2024-06-04 DIAGNOSIS — Z79899 Other long term (current) drug therapy: Secondary | ICD-10-CM

## 2024-06-04 LAB — COMPREHENSIVE METABOLIC PANEL WITH GFR
AG Ratio: 1.1 (calc) (ref 1.0–2.5)
ALT: 15 U/L (ref 6–29)
AST: 28 U/L (ref 10–35)
Albumin: 3.8 g/dL (ref 3.6–5.1)
Alkaline phosphatase (APISO): 75 U/L (ref 37–153)
BUN: 16 mg/dL (ref 7–25)
CO2: 27 mmol/L (ref 20–32)
Calcium: 9.1 mg/dL (ref 8.6–10.4)
Chloride: 104 mmol/L (ref 98–110)
Creat: 0.63 mg/dL (ref 0.50–1.05)
Globulin: 3.5 g/dL (ref 1.9–3.7)
Glucose, Bld: 131 mg/dL — ABNORMAL HIGH (ref 65–99)
Potassium: 3.7 mmol/L (ref 3.5–5.3)
Sodium: 141 mmol/L (ref 135–146)
Total Bilirubin: 0.4 mg/dL (ref 0.2–1.2)
Total Protein: 7.3 g/dL (ref 6.1–8.1)
eGFR: 97 mL/min/1.73m2 (ref >=60)

## 2024-06-04 LAB — CBC WITH DIFFERENTIAL/PLATELET
Absolute Lymphocytes: 927 {cells}/uL (ref 850–3900)
Absolute Monocytes: 438 {cells}/uL (ref 200–950)
Basophils Absolute: 37 {cells}/uL (ref 0–200)
Basophils Relative: 0.5 %
Eosinophils Absolute: 139 {cells}/uL (ref 15–500)
Eosinophils Relative: 1.9 %
HCT: 39.6 % (ref 35.0–45.0)
Hemoglobin: 12.4 g/dL (ref 11.7–15.5)
MCH: 28.3 pg (ref 27.0–33.0)
MCHC: 31.3 g/dL — ABNORMAL LOW (ref 32.0–36.0)
MCV: 90.4 fL (ref 80.0–100.0)
MPV: 10.6 fL (ref 7.5–12.5)
Monocytes Relative: 6 %
Neutro Abs: 5760 {cells}/uL (ref 1500–7800)
Neutrophils Relative %: 78.9 %
Platelets: 240 Thousand/uL (ref 140–400)
RBC: 4.38 Million/uL (ref 3.80–5.10)
RDW: 15.2 % — ABNORMAL HIGH (ref 11.0–15.0)
Total Lymphocyte: 12.7 %
WBC: 7.3 Thousand/uL (ref 3.8–10.8)

## 2024-06-04 NOTE — Telephone Encounter (Signed)
 Pt came in to get labs for medication reasoning. Pt wanted to inform the nurse that if the labs were stable should would like to increase her medication to 2 tablets daily.

## 2024-06-05 ENCOUNTER — Ambulatory Visit: Payer: Self-pay | Admitting: Rheumatology

## 2024-06-05 MED ORDER — AZATHIOPRINE 50 MG PO TABS: 100.0000 mg | ORAL_TABLET | Freq: Every day | ORAL | 0 refills | Status: DC

## 2024-06-05 NOTE — Progress Notes (Signed)
 Yes, she may increase Imuran  50 mg tablet, 2 tablets p.o. daily.  She should have labs in 2 weeks, 2 months and then every 3 months.

## 2024-06-05 NOTE — Telephone Encounter (Signed)
 Please review and sign

## 2024-06-05 NOTE — Progress Notes (Signed)
 CBC and CMP are stable.  Glucose is elevated.  Please notify patient and forward results to her PCP.

## 2024-06-11 NOTE — Progress Notes (Signed)
 Office Visit Note  Patient: Gloria Porter             Date of Birth: 27-May-1956           MRN: [id number]             PCP: Terrill Medford (Inactive) Referring: No ref. provider found Visit Date: 06/25/2024 Occupation: Data Unavailable  Subjective:  Pain in both hands   History of Present Illness: Gloria Porter is a 68 y.o. female with history of mixed connective tissue disease.  Patient is currently taking imuran  100 mg daily.  Patient increased the dose of Imuran  to 2 tablets daily after lab work on 06/04/2024.  Gloria Porter has been tolerating increased dose of Imuran .  Patient has noticed about a 50 to 60% improvement in her pain levels in both hands since initiating Imuran .  Gloria Porter has bad about a 30% improvement in the strength in her hands.  Patient states that Gloria Porter discontinued Plaquenil  when Gloria Porter was initiated on Imuran  due to being unaware that Gloria Porter was post to continue both medications.  Patient states that after last ophthalmology visit Gloria Porter was encouraged to reduce the dose of Plaquenil  but has not been taking it since then. Patient continues to have difficulty performing ADLs due to difficulty making complete fist.  Gloria Porter has noticed decreased grip strength and has been having to use devices in the home to help her open jars and door handles. Patient continues to have discomfort due to trochanter bursitis of both hips.  Gloria Porter is using a walker to assist with ambulation.  Gloria Porter had a fall on 04/03/2024 while at home and strained her right hamstring.  Patient had dry needling performed and the symptoms have not recurred.  Patient is currently going to physical therapy for bilateral shoulder pain and discomfort in the right elbow.      Activities of Daily Living:  Patient reports joint stiffness all day  Patient Reports nocturnal pain.  Difficulty dressing/grooming: Denies Difficulty climbing stairs: Reports Difficulty getting out of chair: Reports Difficulty using hands for taps, buttons,  cutlery, and/or writing: Reports  Review of Systems  Constitutional:  Positive for fatigue.  HENT:  Positive for mouth dryness. Negative for mouth sores and nose dryness.   Eyes:  Positive for dryness. Negative for pain and visual disturbance.  Respiratory:  Negative for cough, hemoptysis, shortness of breath and difficulty breathing.   Cardiovascular:  Negative for chest pain, palpitations, hypertension and swelling in legs/feet.  Gastrointestinal:  Positive for constipation and diarrhea. Negative for blood in stool.  Endocrine: Negative for increased urination.  Genitourinary:  Negative for painful urination.  Musculoskeletal:  Positive for joint pain, joint pain, joint swelling, morning stiffness and muscle tenderness. Negative for myalgias, muscle weakness and myalgias.  Skin:  Positive for color change. Negative for pallor, rash, hair loss, nodules/bumps, skin tightness, ulcers and sensitivity to sunlight.  Allergic/Immunologic: Negative for susceptible to infections.  Neurological:  Negative for dizziness, numbness, headaches and weakness.  Hematological:  Negative for swollen glands.  Psychiatric/Behavioral:  Positive for depressed mood and sleep disturbance. The patient is nervous/anxious.     PMFS History:  Patient Active Problem List   Diagnosis Date Noted   History of lumbar surgery 09/16/2019   Pseudoarthrosis of lumbar spine 03/02/2019   Primary osteoarthritis of both hands 04/10/2017   Primary osteoarthritis of both knees 04/10/2017   DDD (degenerative disc disease), lumbar 04/10/2017   Mixed connective tissue disease 04/10/2017   S/P trigger finger release  bilateral third 03/14/2017   MGUS (monoclonal gammopathy of unknown significance) 03/11/2017   History of chronic kidney disease 03/11/2017   Monoclonal paraproteinemia 01/11/2016   Restless leg 01/11/2015   Carpal tunnel syndrome 12/28/2014   Dysphagia, oropharyngeal 12/28/2014   Anxiety, generalized 10/14/2014    Generalized joint pain 08/17/2014   Encounter for therapeutic drug monitoring 07/13/2014   Failed back syndrome of lumbar spine 07/13/2014   Encounter for therapeutic drug level monitoring 07/13/2014   ACTH excess, central (HCC) 06/16/2014   Diabetes (HCC) 06/16/2014   Fibrositis 06/16/2014   Cannot sleep 06/16/2014   Extreme obesity 06/16/2014   Neuropathy 06/16/2014   Allergic rhinitis, seasonal 06/16/2014   Post menopausal syndrome 06/16/2014   Sjogren's syndrome 06/16/2014   Spinal stenosis 06/16/2014   Adult hypothyroidism 06/16/2014   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HCC) 06/16/2014   Diabetes mellitus (HCC) 06/16/2014   Fibromyalgia 06/16/2014   Morbid obesity (HCC) 06/16/2014   Sjogren's syndrome 06/16/2014   Postmenopausal HRT (hormone replacement therapy) 06/16/2014   Breath shortness 01/21/2014   Essential (primary) hypertension 01/21/2014   Acid reflux 01/21/2014   Adiposity 01/21/2014   Depression with anxiety 08/26/2013   OSA (obstructive sleep apnea) 01/17/2013   Cushing's syndrome 05/23/2011   Pituitary adenoma (HCC) 05/23/2011    Past Medical History:  Diagnosis Date   Anxiety    Arthritis    Bursitis    Cushing's disease (HCC)    pituitary tumor removed 2012   DDD (degenerative disc disease), lumbar    Diabetes mellitus    Endocrinologist Dr. Elsie Sharps   Difficult intubation    2011 scratched trachea   Dyspnea    upon exertion   Fibromyalgia    Gastroparesis    GERD (gastroesophageal reflux disease)    Headache(784.0)    Hypertension    Hypothyroidism    Lumbar disc disease    MGUS (monoclonal gammopathy of unknown significance)    Mixed connective tissue disease    Neuropathy    Pneumonia    Sjogren's disease    Sleep apnea    no cpap.sleep study 2006 [facility]    Family History  Problem Relation Age of Onset   Diabetes type II Mother    Diabetes type II Father    Heart disease Father    Heart attack Father    Colon cancer  Maternal Grandmother    Heart disease Maternal Grandfather    Past Surgical History:  Procedure Laterality Date   APPENDECTOMY     BACK SURGERY     lumbar fusion   BIOPSY  01/15/2020   Procedure: BIOPSY;  Surgeon: Rollin Dover, MD;  Location: [facility];  Service: Endoscopy;;   BRAIN SURGERY     For cushings   CARPAL TUNNEL RELEASE Bilateral    CATARACT EXTRACTION, BILATERAL     CESAREAN SECTION  10/31/1973   COLONOSCOPY     COLONOSCOPY WITH PROPOFOL  N/A 01/15/2020   Procedure: COLONOSCOPY WITH PROPOFOL ;  Surgeon: Rollin Dover, MD;  Location: [facility];  Service: Endoscopy;  Laterality: N/A;   COMBINED HYSTEROSCOPY DIAGNOSTIC / D&C     ENDOMETRIAL ABLATION     endoscopic dacryocystorhinostomy left  02/12/2024   ESOPHAGOGASTRODUODENOSCOPY (EGD) WITH PROPOFOL  N/A 01/15/2020   Procedure: ESOPHAGOGASTRODUODENOSCOPY (EGD) WITH PROPOFOL ;  Surgeon: Rollin Dover, MD;  Location: [facility];  Service: Endoscopy;  Laterality: N/A;   ESOPHAGOGASTRODUODENOSCOPY ENDOSCOPY     FRACTURE SURGERY     l arm   LUMBAR LAMINECTOMY/DECOMPRESSION MICRODISCECTOMY  11/13/2011   Procedure: LUMBAR LAMINECTOMY/DECOMPRESSION  MICRODISCECTOMY;  Surgeon: Darina MALVA Boehringer, MD;  Location: [facility];  Service: Neurosurgery;  Laterality: Bilateral;  Bilateral Lumbar four-five Decompression   POLYPECTOMY  01/15/2020   Procedure: POLYPECTOMY;  Surgeon: Rollin Dover, MD;  Location: [facility];  Service: Endoscopy;;   TEAR DUCT PROBING Left 02/12/2024   TONSILLECTOMY     TRANSPHENOIDAL / TRANSNASAL HYPOPHYSECTOMY / RESECTION PITUITARY TUMOR     TRIGGER FINGER RELEASE     TUBAL LIGATION     Social History   Tobacco Use   Smoking status: Former    Current packs/day: 0.00    Average packs/day: 1 pack/day for 32.0 years (32.0 ttl pk-yrs)    Types: Cigarettes    Start date: 07/27/1974    Quit date: 07/27/2006    Years since quitting: 17.9    Passive exposure: Past   Smokeless tobacco: Never  Vaping  Use   Vaping status: Never Used  Substance Use Topics   Alcohol use: Not Currently   Drug use: No   Social History   Social History Narrative   Not on file     Immunization History  Administered Date(s) Administered   Influenza Inj Mdck Quad Pf 07/12/2017, 07/14/2018   Influenza Split 06/13/2017   Moderna Covid-19 Fall Seasonal Vaccine 26yrs & older 06/19/2022   Moderna Covid-19 Vaccine Bivalent Booster 26yrs & up 06/05/2021   Moderna Sars-Covid-2 Vaccination 11/03/2019, 12/01/2019, 06/16/2020, 12/16/2020   Pneumococcal Conjugate-13 12/28/2014   Pneumococcal Polysaccharide-23 01/03/2017     Objective: Vital Signs: BP 135/74 (BP Location: Right Arm, Patient Position: Sitting, Cuff Size: Normal)   Pulse 74   Temp 98.2 F (36.8 C)   Resp 17   Ht 5' 3 (1.6 m)   Wt 267 lb (121.1 kg)   LMP 11/08/2006   BMI 47.30 kg/m    Physical Exam Vitals and nursing note reviewed.  Constitutional:      Appearance: Gloria Porter is well-developed.  HENT:     Head: Normocephalic and atraumatic.  Eyes:     Conjunctiva/sclera: Conjunctivae normal.  Cardiovascular:     Rate and Rhythm: Normal rate and regular rhythm.     Heart sounds: Normal heart sounds.  Pulmonary:     Effort: Pulmonary effort is normal.     Breath sounds: Normal breath sounds.  Abdominal:     General: Bowel sounds are normal.     Palpations: Abdomen is soft.  Musculoskeletal:     Cervical back: Normal range of motion.  Lymphadenopathy:     Cervical: No cervical adenopathy.  Skin:    General: Skin is warm and dry.     Capillary Refill: Capillary refill takes less than 2 seconds.  Neurological:     Mental Status: Gloria Porter is alert and oriented to person, place, and time.  Psychiatric:        Behavior: Behavior normal.      Musculoskeletal Exam: Patient remain seated during examination today.  C-spine has painful and limited range of motion with lateral rotation.  Thoracic kyphosis.  Painful limited mobility of both  shoulder joints.  Tenderness of the right elbow.  Tenderness along the ulnar aspect of the left wrist.  Tenderness of all PIP joints.  Inflammation was noted in the left first PIP joint.  Incomplete fist formation bilaterally difficult to assess hip range of motion while seated.  Knee joints are good range of motion no warmth or effusion.  No tenderness of ankle joints.  Pedal edema noted bilaterally.  Tenderness over bilateral trochanteric bursa.  CDAI Exam: CDAI Score: -- Patient Global: --; Provider Global: -- Swollen: --; Tender: -- Joint Exam 06/25/2024   No joint exam has been documented for this visit   There is currently no information documented on the homunculus. Go to the Rheumatology activity and complete the homunculus joint exam.  Investigation: No additional findings.  Imaging: No results found.  Recent Labs: Lab Results  Component Value Date   WBC 7.3 06/04/2024   HGB 12.4 06/04/2024   PLT 240 06/04/2024   NA 141 06/04/2024   K 3.7 06/04/2024   CL 104 06/04/2024   CO2 27 06/04/2024   GLUCOSE 131 (H) 06/04/2024   BUN 16 06/04/2024   CREATININE 0.63 06/04/2024   BILITOT 0.4 06/04/2024   ALKPHOS 67 04/03/2024   AST 28 06/04/2024   ALT 15 06/04/2024   PROT 7.3 06/04/2024   ALBUMIN  3.2 (L) 04/03/2024   CALCIUM  9.1 06/04/2024   GFRAA 111 01/05/2021   QFTBGOLDPLUS NEGATIVE 03/19/2024    Speciality Comments: PLQ eye exam: 12/31/2023 [facility] [place] Follow up in 1 year Methotrexate -GI side effects  Procedures:  No procedures performed Allergies: Atorvastatin calcium , Codeine, Methotrexate , Atorvastatin, Lisinopril, Meloxicam, Quinapril, and Quinapril hcl   Assessment / Plan:     Visit Diagnoses: Mixed connective tissue disease - ANA 1:160 NS, RNP positive, inflammatory arthritis: Patient was last seen in the office on 03/19/2024 at which time the plan was to add on Imuran  as combination therapy.  Patient is now taking Imuran  100 mg daily and has  been tolerating it without any side effects.  Patient discontinued Plaquenil  due to being unaware that Gloria Porter was post to remain on combination therapy.  Gloria Porter was previously taking Plaquenil  twice daily but according to the patient with her most recent Plaquenil  eye examination Gloria Porter was encouraged to reduce the dose.  Patient plans on reaching out to ophthalmology to ensure that Gloria Porter is able to resume taking Plaquenil  and at what dose. Gloria Porter has noticed about a 50 to 60% improvement in her hand pain since initiating Imuran .  Gloria Porter has noticed about a 30% improvement in her hand strength but continues to have difficulty making a complete fist.  Gloria Porter has tenderness across all PIP joints with inflammation in the left first PIP joint.  Gloria Porter is a difficulty performing ADLs especially in the kitchen due to grip strength. Gloria Porter will benefit from reinitiating Plaquenil  and remaining on Imuran  100 mg daily.  Gloria Porter will continue to require close of monitoring.  The following labs will be obtained today for further evaluation.  Gloria Porter will follow-up in the office in 3 months or sooner if needed. - Plan: CBC with Differential/Platelet, Comprehensive metabolic panel with GFR, C3 and C4, Sedimentation rate, Urinalysis, Routine w reflex microscopic, Anti-DNA antibody, double-stranded, ANA, RNP Antibody  High risk medication use -Imuran  100 mg daily. Would likely plan to reinitiate Plaquenil  200 mg 1 tablet twice daily Monday through Friday.   Plaquenil  eye examination is up-to-date and patient will be having results forwarded to our office. CBC and CMP updated on 06/04/24.  Plan to update CBC and CMP today. Plan: CBC with Differential/Platelet, Comprehensive metabolic panel with GFR  Pain in both hands: Ultrasound of both hands was positive for synovitis and tenosynovitis on 03/19/2024.  Imuran  has been added and Gloria Porter has been taking 100 mg daily.  Gloria Porter has noticed about a 50 to 60% improvement in her hand pain but continues to have  difficulty making complete fist.  Gloria Porter discontinued  Plaquenil  due to being unaware that Gloria Porter was to remain on combination therapy.  Patient plans on reaching out to ophthalmology to ensure Gloria Porter can resume taking Plaquenil .  Gloria Porter will remain on Imuran  as prescribed.  Primary osteoarthritis of both hands: DIP and DIP thickening.  Incomplete fist formation.  Sicca complex - Ro-, La-: Patient continues to have chronic sicca symptoms.  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  Using a rollator walker to assist with ambulation.  Under the care of Dr. Yvone.  Gloria Porter is taking Celebrex as prescribed for pain relief.  Primary osteoarthritis of both knees: Gloria Porter has good range of motion of both knee joints on examination today.  No warmth or effusion noted.  Degeneration of intervertebral disc of lumbar region without discogenic back pain or lower extremity pain: No symptoms of radiculopathy currently.  Fibromyalgia: Gloria Porter has generalized hyperalgesia and positive tender points on exam.  Other fatigue: Chronic, stable.  Other medical conditions are listed as follows:  Shortness of breath - Gloria Porter was evaluated by Dr. Geronimo in 2019 at that time high-resolution CT was normal and echocardiogram was normal  History of Cushing disease  History of diabetes mellitus  History of chronic kidney disease  History of hypothyroidism  MGUS (monoclonal gammopathy of unknown significance)  History of anxiety  History of depression  OSA (obstructive sleep apnea)  History of cholecystectomy  Orders: Orders Placed This Encounter  Procedures   CBC with Differential/Platelet   Comprehensive metabolic panel with GFR   C3 and C4   Sedimentation rate   Urinalysis, Routine w reflex microscopic   Anti-DNA antibody, double-stranded   ANA   RNP Antibody   No orders of the defined types were placed in this encounter.   Follow-Up Instructions: Return in about 3 months (around 09/25/2024) for  MCTD.   Waddell CHRISTELLA Craze, PA-C  Note - This record has been created using Dragon software.  Chart creation errors have been sought, but may not always  have been located. Such creation errors do not reflect on  the standard of medical care.

## 2024-06-25 ENCOUNTER — Ambulatory Visit: Attending: Physician Assistant | Admitting: Physician Assistant

## 2024-06-25 ENCOUNTER — Encounter: Payer: Self-pay | Admitting: Physician Assistant

## 2024-06-25 VITALS — BP 135/74 | HR 74 | Temp 98.2°F | Resp 17 | Ht 63.0 in | Wt 267.0 lb

## 2024-06-25 DIAGNOSIS — Z8659 Personal history of other mental and behavioral disorders: Secondary | ICD-10-CM | POA: Diagnosis present

## 2024-06-25 DIAGNOSIS — Z79899 Other long term (current) drug therapy: Secondary | ICD-10-CM | POA: Insufficient documentation

## 2024-06-25 DIAGNOSIS — R5383 Other fatigue: Secondary | ICD-10-CM | POA: Diagnosis present

## 2024-06-25 DIAGNOSIS — M79642 Pain in left hand: Secondary | ICD-10-CM | POA: Insufficient documentation

## 2024-06-25 DIAGNOSIS — D472 Monoclonal gammopathy: Secondary | ICD-10-CM | POA: Diagnosis present

## 2024-06-25 DIAGNOSIS — M19041 Primary osteoarthritis, right hand: Secondary | ICD-10-CM | POA: Diagnosis present

## 2024-06-25 DIAGNOSIS — M7062 Trochanteric bursitis, left hip: Secondary | ICD-10-CM | POA: Insufficient documentation

## 2024-06-25 DIAGNOSIS — Z8639 Personal history of other endocrine, nutritional and metabolic disease: Secondary | ICD-10-CM | POA: Insufficient documentation

## 2024-06-25 DIAGNOSIS — M797 Fibromyalgia: Secondary | ICD-10-CM | POA: Insufficient documentation

## 2024-06-25 DIAGNOSIS — M7061 Trochanteric bursitis, right hip: Secondary | ICD-10-CM | POA: Insufficient documentation

## 2024-06-25 DIAGNOSIS — Z9049 Acquired absence of other specified parts of digestive tract: Secondary | ICD-10-CM | POA: Insufficient documentation

## 2024-06-25 DIAGNOSIS — G4733 Obstructive sleep apnea (adult) (pediatric): Secondary | ICD-10-CM | POA: Insufficient documentation

## 2024-06-25 DIAGNOSIS — M79641 Pain in right hand: Secondary | ICD-10-CM | POA: Diagnosis present

## 2024-06-25 DIAGNOSIS — M170 Bilateral primary osteoarthritis of knee: Secondary | ICD-10-CM | POA: Diagnosis present

## 2024-06-25 DIAGNOSIS — M19042 Primary osteoarthritis, left hand: Secondary | ICD-10-CM | POA: Insufficient documentation

## 2024-06-25 DIAGNOSIS — Z87448 Personal history of other diseases of urinary system: Secondary | ICD-10-CM | POA: Insufficient documentation

## 2024-06-25 DIAGNOSIS — M3500 Sicca syndrome, unspecified: Secondary | ICD-10-CM | POA: Diagnosis present

## 2024-06-25 DIAGNOSIS — M351 Other overlap syndromes: Secondary | ICD-10-CM | POA: Insufficient documentation

## 2024-06-25 DIAGNOSIS — R0602 Shortness of breath: Secondary | ICD-10-CM | POA: Insufficient documentation

## 2024-06-25 DIAGNOSIS — M51369 Other intervertebral disc degeneration, lumbar region without mention of lumbar back pain or lower extremity pain: Secondary | ICD-10-CM | POA: Insufficient documentation

## 2024-06-27 LAB — URINALYSIS, ROUTINE W REFLEX MICROSCOPIC
Bilirubin Urine: NEGATIVE
Hgb urine dipstick: NEGATIVE
Hyaline Cast: NONE SEEN /LPF
Ketones, ur: NEGATIVE
Nitrite: POSITIVE — AB
Protein, ur: NEGATIVE
RBC / HPF: NONE SEEN /HPF (ref 0–2)
Specific Gravity, Urine: 1.019 (ref 1.001–1.035)
pH: 6 (ref 5.0–8.0)

## 2024-06-27 LAB — ANTI-DNA ANTIBODY, DOUBLE-STRANDED: ds DNA Ab: 1 [IU]/mL

## 2024-06-27 LAB — COMPREHENSIVE METABOLIC PANEL WITH GFR
AG Ratio: 1.2 (calc) (ref 1.0–2.5)
ALT: 12 U/L (ref 6–29)
AST: 18 U/L (ref 10–35)
Albumin: 4 g/dL (ref 3.6–5.1)
Alkaline phosphatase (APISO): 67 U/L (ref 37–153)
BUN: 20 mg/dL (ref 7–25)
CO2: 28 mmol/L (ref 20–32)
Calcium: 9 mg/dL (ref 8.6–10.4)
Chloride: 104 mmol/L (ref 98–110)
Creat: 0.62 mg/dL (ref 0.50–1.05)
Globulin: 3.4 g/dL (ref 1.9–3.7)
Glucose, Bld: 103 mg/dL — ABNORMAL HIGH (ref 65–99)
Potassium: 4.1 mmol/L (ref 3.5–5.3)
Sodium: 140 mmol/L (ref 135–146)
Total Bilirubin: 0.4 mg/dL (ref 0.2–1.2)
Total Protein: 7.4 g/dL (ref 6.1–8.1)
eGFR: 98 mL/min/1.73m2 (ref >=60)

## 2024-06-27 LAB — CBC WITH DIFFERENTIAL/PLATELET
Absolute Lymphocytes: 951 {cells}/uL (ref 850–3900)
Absolute Monocytes: 442 {cells}/uL (ref 200–950)
Basophils Absolute: 47 {cells}/uL (ref 0–200)
Basophils Relative: 0.7 %
Eosinophils Absolute: 141 {cells}/uL (ref 15–500)
Eosinophils Relative: 2.1 %
HCT: 35.5 % (ref 35.0–45.0)
Hemoglobin: 11.4 g/dL — ABNORMAL LOW (ref 11.7–15.5)
MCH: 28.6 pg (ref 27.0–33.0)
MCHC: 32.1 g/dL (ref 32.0–36.0)
MCV: 89.2 fL (ref 80.0–100.0)
MPV: 10.7 fL (ref 7.5–12.5)
Monocytes Relative: 6.6 %
Neutro Abs: 5119 {cells}/uL (ref 1500–7800)
Neutrophils Relative %: 76.4 %
Platelets: 205 Thousand/uL (ref 140–400)
RBC: 3.98 Million/uL (ref 3.80–5.10)
RDW: 14.8 % (ref 11.0–15.0)
Total Lymphocyte: 14.2 %
WBC: 6.7 Thousand/uL (ref 3.8–10.8)

## 2024-06-27 LAB — ANTI-NUCLEAR AB-TITER (ANA TITER)
ANA TITER: 1:320 {titer} — ABNORMAL HIGH
ANA Titer 1: 1:80 {titer} — ABNORMAL HIGH

## 2024-06-27 LAB — ANA: Anti Nuclear Antibody (ANA): POSITIVE — AB

## 2024-06-27 LAB — RNP ANTIBODY: Ribonucleic Protein(ENA) Antibody, IgG: <1 AI

## 2024-06-27 LAB — C3 AND C4
C3 Complement: 178 mg/dL (ref 83–193)
C4 Complement: 20 mg/dL (ref 15–57)

## 2024-06-27 LAB — SEDIMENTATION RATE: Sed Rate: 77 mm/h — ABNORMAL HIGH (ref 0–30)

## 2024-06-27 LAB — MICROSCOPIC MESSAGE

## 2024-06-28 ENCOUNTER — Ambulatory Visit: Payer: Self-pay | Admitting: Physician Assistant

## 2024-06-28 NOTE — Progress Notes (Signed)
 Hemoglobin is borderline low-11.4, rest of CBC WNL.   Glucose is 103, rest of CMP WNL dsDNA negative  ESR remains elevated but has improved. ANA remains positive.  RNP negative  Complements WNL UA consistent with a possible UTI-please advise the patient to reach out to PCP--no urine culture was obtained

## 2024-07-23 ENCOUNTER — Other Ambulatory Visit: Payer: Self-pay | Admitting: Orthopedic Surgery

## 2024-07-23 DIAGNOSIS — M542 Cervicalgia: Secondary | ICD-10-CM

## 2024-07-24 ENCOUNTER — Encounter: Payer: Self-pay | Admitting: Orthopedic Surgery

## 2024-07-28 ENCOUNTER — Other Ambulatory Visit: Payer: Self-pay | Admitting: Orthopedic Surgery

## 2024-07-28 DIAGNOSIS — M542 Cervicalgia: Secondary | ICD-10-CM

## 2024-08-07 ENCOUNTER — Ambulatory Visit
Admission: RE | Admit: 2024-08-07 | Discharge: 2024-08-07 | Disposition: A | Discharge status: Home / Self Care | Source: Ambulatory Visit | Attending: Orthopedic Surgery | Admitting: Orthopedic Surgery

## 2024-08-07 DIAGNOSIS — M542 Cervicalgia: Secondary | ICD-10-CM

## 2024-08-23 ENCOUNTER — Ambulatory Visit
Admission: RE | Admit: 2024-08-23 | Discharge: 2024-08-23 | Disposition: A | Discharge status: Home / Self Care | Source: Ambulatory Visit | Attending: Orthopedic Surgery | Admitting: Orthopedic Surgery

## 2024-08-28 ENCOUNTER — Other Ambulatory Visit: Payer: Self-pay | Admitting: Rheumatology

## 2024-08-28 DIAGNOSIS — M351 Other overlap syndromes: Secondary | ICD-10-CM

## 2024-08-28 NOTE — Telephone Encounter (Signed)
 Last Fill: 01/07/2024  Eye exam: 12/31/2023   Labs: 06/25/2024 Hemoglobin is borderline low-11.4, rest of CBC WNL.   Glucose is 103, rest of CMP WNL dsDNA negative  ESR remains elevated but has improved. ANA remains positive.  RNP negative  Complements WNL UA consistent with a possible UTI-please advise the patient to reach out to PCP--no urine culture was obtained  Next Visit: 09/29/2024  Last Visit: 06/25/2024  DX: Mixed connective tissue disease   Current Dose per office note on 06/25/2024: Plaquenil  200 mg 1 tablet twice daily Monday through Friday.   Okay to refill Plaquenil ?

## 2024-09-02 ENCOUNTER — Other Ambulatory Visit: Payer: Self-pay | Admitting: Rheumatology

## 2024-09-03 NOTE — Telephone Encounter (Signed)
 Last Fill: 06/05/2024  Labs: 06/25/2024 Hemoglobin is borderline low-11.4, rest of CBC WNL.   Glucose is 103, rest of CMP WNL  Next Visit: 09/29/2024  Last Visit: 06/25/2024  DX: Mixed connective tissue disease   Current Dose per office note 06/25/2024: Imuran  100 mg daily   Okay to refill Imuran ?

## 2024-09-08 MED ORDER — AZATHIOPRINE 50 MG PO TABS: 100.0000 mg | ORAL_TABLET | Freq: Every day | ORAL | 0 refills | Status: AC

## 2024-09-15 NOTE — Progress Notes (Unsigned)
 "  Office Visit Note  Patient: Gloria Porter             Date of Birth: 07/17/1956           MRN: [id number]             PCP: Terrill Medford (Inactive) Referring: No ref. provider found Visit Date: 09/29/2024 Occupation: Data Unavailable  Subjective:  No chief complaint on file.   History of Present Illness: Gloria Porter is a 69 y.o. female ***     Activities of Daily Living:  Patient reports morning stiffness for *** {minute/hour:19697}.   Patient {ACTIONS;DENIES/REPORTS:21021675::Denies} nocturnal pain.  Difficulty dressing/grooming: {ACTIONS;DENIES/REPORTS:21021675::Denies} Difficulty climbing stairs: {ACTIONS;DENIES/REPORTS:21021675::Denies} Difficulty getting out of chair: {ACTIONS;DENIES/REPORTS:21021675::Denies} Difficulty using hands for taps, buttons, cutlery, and/or writing: {ACTIONS;DENIES/REPORTS:21021675::Denies}  No Rheumatology ROS completed.   PMFS History:  Patient Active Problem List   Diagnosis Date Noted   History of lumbar surgery 09/16/2019   Pseudoarthrosis of lumbar spine 03/02/2019   Primary osteoarthritis of both hands 04/10/2017   Primary osteoarthritis of both knees 04/10/2017   DDD (degenerative disc disease), lumbar 04/10/2017   Mixed connective tissue disease 04/10/2017   S/P trigger finger release bilateral third 03/14/2017   MGUS (monoclonal gammopathy of unknown significance) 03/11/2017   History of chronic kidney disease 03/11/2017   Monoclonal paraproteinemia 01/11/2016   Restless leg 01/11/2015   Carpal tunnel syndrome 12/28/2014   Dysphagia, oropharyngeal 12/28/2014   Anxiety, generalized 10/14/2014   Generalized joint pain 08/17/2014   Encounter for therapeutic drug monitoring 07/13/2014   Failed back syndrome of lumbar spine 07/13/2014   Encounter for therapeutic drug level monitoring 07/13/2014   ACTH excess, central (HCC) 06/16/2014   Diabetes (HCC) 06/16/2014   Fibrositis 06/16/2014   Cannot sleep  06/16/2014   Extreme obesity 06/16/2014   Neuropathy 06/16/2014   Allergic rhinitis, seasonal 06/16/2014   Post menopausal syndrome 06/16/2014   Sjogren's syndrome 06/16/2014   Spinal stenosis 06/16/2014   Adult hypothyroidism 06/16/2014   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HCC) 06/16/2014   Diabetes mellitus (HCC) 06/16/2014   Fibromyalgia 06/16/2014   Morbid obesity (HCC) 06/16/2014   Sjogren's syndrome 06/16/2014   Postmenopausal HRT (hormone replacement therapy) 06/16/2014   Breath shortness 01/21/2014   Essential (primary) hypertension 01/21/2014   Acid reflux 01/21/2014   Adiposity 01/21/2014   Depression with anxiety 08/26/2013   OSA (obstructive sleep apnea) 01/17/2013   Cushing's syndrome 05/23/2011   Pituitary adenoma (HCC) 05/23/2011    Past Medical History:  Diagnosis Date   Anxiety    Arthritis    Bursitis    Cushing's disease (HCC)    pituitary tumor removed 2012   DDD (degenerative disc disease), lumbar    Diabetes mellitus    Endocrinologist Dr. Elsie Sharps   Difficult intubation    2011 scratched trachea   Dyspnea    upon exertion   Fibromyalgia    Gastroparesis    GERD (gastroesophageal reflux disease)    Headache(784.0)    Hypertension    Hypothyroidism    Lumbar disc disease    MGUS (monoclonal gammopathy of unknown significance)    Mixed connective tissue disease    Neuropathy    Pneumonia    Sjogren's disease    Sleep apnea    no cpap.sleep study 2006 [facility]    Family History  Problem Relation Age of Onset   Diabetes type II Mother    Diabetes type II Father    Heart disease Father    Heart attack Father  Colon cancer Maternal Grandmother    Heart disease Maternal Grandfather    Past Surgical History:  Procedure Laterality Date   APPENDECTOMY     BACK SURGERY     lumbar fusion   BIOPSY  01/15/2020   Procedure: BIOPSY;  Surgeon: Rollin Dover, MD;  Location: [facility];  Service: Endoscopy;;   BRAIN SURGERY     For  cushings   CARPAL TUNNEL RELEASE Bilateral    CATARACT EXTRACTION, BILATERAL     CESAREAN SECTION  10/31/1973   COLONOSCOPY     COLONOSCOPY WITH PROPOFOL  N/A 01/15/2020   Procedure: COLONOSCOPY WITH PROPOFOL ;  Surgeon: Rollin Dover, MD;  Location: [facility];  Service: Endoscopy;  Laterality: N/A;   COMBINED HYSTEROSCOPY DIAGNOSTIC / D&C     ENDOMETRIAL ABLATION     endoscopic dacryocystorhinostomy left  02/12/2024   ESOPHAGOGASTRODUODENOSCOPY (EGD) WITH PROPOFOL  N/A 01/15/2020   Procedure: ESOPHAGOGASTRODUODENOSCOPY (EGD) WITH PROPOFOL ;  Surgeon: Rollin Dover, MD;  Location: [facility];  Service: Endoscopy;  Laterality: N/A;   ESOPHAGOGASTRODUODENOSCOPY ENDOSCOPY     FRACTURE SURGERY     l arm   LUMBAR LAMINECTOMY/DECOMPRESSION MICRODISCECTOMY  11/13/2011   Procedure: LUMBAR LAMINECTOMY/DECOMPRESSION MICRODISCECTOMY;  Surgeon: Darina MALVA Boehringer, MD;  Location: [facility];  Service: Neurosurgery;  Laterality: Bilateral;  Bilateral Lumbar four-five Decompression   POLYPECTOMY  01/15/2020   Procedure: POLYPECTOMY;  Surgeon: Rollin Dover, MD;  Location: [facility];  Service: Endoscopy;;   TEAR DUCT PROBING Left 02/12/2024   TONSILLECTOMY     TRANSPHENOIDAL / TRANSNASAL HYPOPHYSECTOMY / RESECTION PITUITARY TUMOR     TRIGGER FINGER RELEASE     TUBAL LIGATION     Social History[1] Social History   Social History Narrative   Not on file     Immunization History  Administered Date(s) Administered   Influenza Inj Mdck Quad Pf 07/12/2017, 07/14/2018   Influenza Split 06/13/2017   Moderna Covid-19 Fall Seasonal Vaccine 63yrs & older 06/19/2022   Moderna Covid-19 Vaccine Bivalent Booster 7yrs & up 06/05/2021   Moderna Sars-Covid-2 Vaccination 11/03/2019, 12/01/2019, 06/16/2020, 12/16/2020   Pneumococcal Conjugate-13 12/28/2014   Pneumococcal Polysaccharide-23 01/03/2017     Objective: Vital Signs: LMP 11/08/2006    Physical Exam   Musculoskeletal Exam: ***  CDAI  Exam: CDAI Score: -- Patient Global: --; Provider Global: -- Swollen: --; Tender: -- Joint Exam 09/29/2024   No joint exam has been documented for this visit   There is currently no information documented on the homunculus. Go to the Rheumatology activity and complete the homunculus joint exam.  Investigation: No additional findings.  Imaging: MR SHOULDER RIGHT WO CONTRAST Result Date: 08/25/2024 MR SHOULDER WITHOUT IV CONTRAST RIGHT COMPARISON: None. CLINICAL HISTORY: Right shoulder pain. PULSE SEQUENCES: Ax PD FS, Sag T2 FS, Cor T1 & COR T2 FS FINDINGS: Examination is limited secondary to motion and body habitus. Bones: There is mild AC joint arthrosis with slight hypertrophy mild reactive edema. Mild glenohumeral arthrosis is present. Probable loose body is seen in the subcoracoid recess. Rotator cuff: There is insertional tendinosis of the supraspinatus and infraspinatus tendons. No significant partial or full-thickness tear is present. Subscapularis tendon and teres minor tendons are intact. Labrum and biceps tendon: The intra-articular segment of biceps tendon is markedly abnormal. There is suspicion of a full-thickness tear with residual biceps tendon and the superior joint. Further evaluation with MRI arthrogram if it would change management. There is irregularity and abnormal signal in the superior labrum likely posttraumatic. The anterior and posterior labrum are unremarkable. IMPRESSION:  Limited examination secondary to motion and body habitus. AC joint inguinal joint arthrosis as above. Likely loose body in the subcoracoid recess. Insertional tendinosis of the supraspinatus and infraspinatus tendons. There is insertional tendinosis subscapularis tendon. There is no full-thickness rotator cuff tear present. Mild fatty atrophy is seen in the supraspinatus muscle. Likely full-thickness tear of the biceps tendon which is poorly defined. There is suggestion of residual intra-articular torn  segment. There is irregularity and abnormal signal in the superior labrum likely related to traumatic changes of the superior labrum. Clinical correlation. Further evaluation with MRI arthrogram if clinically indicated. Electronically signed by: Norleen Satchel MD 08/25/2024 03:50 PM EST RP Workstation: [id number]    Recent Labs: Lab Results  Component Value Date   WBC 6.7 06/25/2024   HGB 11.4 (L) 06/25/2024   PLT 205 06/25/2024   NA 140 06/25/2024   K 4.1 06/25/2024   CL 104 06/25/2024   CO2 28 06/25/2024   GLUCOSE 103 (H) 06/25/2024   BUN 20 06/25/2024   CREATININE 0.62 06/25/2024   BILITOT 0.4 06/25/2024   ALKPHOS 67 04/03/2024   AST 18 06/25/2024   ALT 12 06/25/2024   PROT 7.4 06/25/2024   ALBUMIN  3.2 (L) 04/03/2024   CALCIUM  9.0 06/25/2024   GFRAA 111 01/05/2021   QFTBGOLDPLUS NEGATIVE 03/19/2024    Speciality Comments: PLQ eye exam: 12/31/2023 [facility] [place] Follow up in 1 year Methotrexate -GI side effects  Procedures:  No procedures performed Allergies: Atorvastatin calcium , Codeine, Methotrexate , Atorvastatin, Lisinopril, Meloxicam, Quinapril, and Quinapril hcl   Assessment / Plan:     Visit Diagnoses: Mixed connective tissue disease  High risk medication use  Pain in both hands  Primary osteoarthritis of both hands  Sicca complex  Trochanteric bursitis of both hips  Primary osteoarthritis of both knees  Degeneration of intervertebral disc of lumbar region without discogenic back pain or lower extremity pain  Fibromyalgia  Other fatigue  History of Cushing disease  History of diabetes mellitus  History of chronic kidney disease  History of hypothyroidism  MGUS (monoclonal gammopathy of unknown significance)  History of anxiety  History of depression  OSA (obstructive sleep apnea)  History of cholecystectomy  Orders: No orders of the defined types were placed in this encounter.  No orders of the defined types were placed  in this encounter.   Face-to-face time spent with patient was *** minutes. Greater than 50% of time was spent in counseling and coordination of care.  Follow-Up Instructions: No follow-ups on file.   Waddell CHRISTELLA Craze, PA-C  Note - This record has been created using Dragon software.  Chart creation errors have been sought, but may not always  have been located. Such creation errors do not reflect on  the standard of medical care.     [1]  Social History Tobacco Use   Smoking status: Former    Current packs/day: 0.00    Average packs/day: 1 pack/day for 32.0 years (32.0 ttl pk-yrs)    Types: Cigarettes    Start date: 07/27/1974    Quit date: 07/27/2006    Years since quitting: 18.1    Passive exposure: Past   Smokeless tobacco: Never  Vaping Use   Vaping status: Never Used  Substance Use Topics   Alcohol use: Not Currently   Drug use: No   "

## 2024-09-29 ENCOUNTER — Ambulatory Visit: Admitting: Physician Assistant

## 2024-09-29 DIAGNOSIS — D472 Monoclonal gammopathy: Secondary | ICD-10-CM

## 2024-09-29 DIAGNOSIS — R0602 Shortness of breath: Secondary | ICD-10-CM

## 2024-09-29 DIAGNOSIS — M3500 Sicca syndrome, unspecified: Secondary | ICD-10-CM

## 2024-09-29 DIAGNOSIS — M7061 Trochanteric bursitis, right hip: Secondary | ICD-10-CM

## 2024-09-29 DIAGNOSIS — Z8639 Personal history of other endocrine, nutritional and metabolic disease: Secondary | ICD-10-CM

## 2024-09-29 DIAGNOSIS — M170 Bilateral primary osteoarthritis of knee: Secondary | ICD-10-CM

## 2024-09-29 DIAGNOSIS — Z9049 Acquired absence of other specified parts of digestive tract: Secondary | ICD-10-CM

## 2024-09-29 DIAGNOSIS — Z79899 Other long term (current) drug therapy: Secondary | ICD-10-CM

## 2024-09-29 DIAGNOSIS — M351 Other overlap syndromes: Secondary | ICD-10-CM

## 2024-09-29 DIAGNOSIS — Z8659 Personal history of other mental and behavioral disorders: Secondary | ICD-10-CM

## 2024-09-29 DIAGNOSIS — M797 Fibromyalgia: Secondary | ICD-10-CM

## 2024-09-29 DIAGNOSIS — M19042 Primary osteoarthritis, left hand: Secondary | ICD-10-CM

## 2024-09-29 DIAGNOSIS — M79641 Pain in right hand: Secondary | ICD-10-CM

## 2024-09-29 DIAGNOSIS — Z87448 Personal history of other diseases of urinary system: Secondary | ICD-10-CM

## 2024-09-29 DIAGNOSIS — R5383 Other fatigue: Secondary | ICD-10-CM

## 2024-09-29 DIAGNOSIS — M51369 Other intervertebral disc degeneration, lumbar region without mention of lumbar back pain or lower extremity pain: Secondary | ICD-10-CM

## 2024-09-29 DIAGNOSIS — G4733 Obstructive sleep apnea (adult) (pediatric): Secondary | ICD-10-CM

## 2024-12-23 ENCOUNTER — Ambulatory Visit: Admitting: Neurology
